# Patient Record
Sex: Female | Born: 1975 | Race: Black or African American | Hispanic: No | Smoking: Never smoker
Health system: Southern US, Community
[De-identification: ages and names within clinical notes are randomized; demographics above are authoritative.]

## PROBLEM LIST (undated history)

## (undated) DIAGNOSIS — T4145XA Adverse effect of unspecified anesthetic, initial encounter: Secondary | ICD-10-CM

## (undated) DIAGNOSIS — R87629 Unspecified abnormal cytological findings in specimens from vagina: Secondary | ICD-10-CM

## (undated) DIAGNOSIS — R87619 Unspecified abnormal cytological findings in specimens from cervix uteri: Secondary | ICD-10-CM

## (undated) DIAGNOSIS — IMO0002 Reserved for concepts with insufficient information to code with codable children: Secondary | ICD-10-CM

## (undated) DIAGNOSIS — E663 Overweight: Secondary | ICD-10-CM

## (undated) DIAGNOSIS — R809 Proteinuria, unspecified: Secondary | ICD-10-CM

## (undated) DIAGNOSIS — N920 Excessive and frequent menstruation with regular cycle: Secondary | ICD-10-CM

## (undated) DIAGNOSIS — B977 Papillomavirus as the cause of diseases classified elsewhere: Secondary | ICD-10-CM

## (undated) DIAGNOSIS — N200 Calculus of kidney: Secondary | ICD-10-CM

## (undated) DIAGNOSIS — Z87448 Personal history of other diseases of urinary system: Secondary | ICD-10-CM

## (undated) DIAGNOSIS — Z973 Presence of spectacles and contact lenses: Secondary | ICD-10-CM

## (undated) DIAGNOSIS — F419 Anxiety disorder, unspecified: Secondary | ICD-10-CM

## (undated) DIAGNOSIS — T8859XA Other complications of anesthesia, initial encounter: Secondary | ICD-10-CM

## (undated) DIAGNOSIS — B009 Herpesviral infection, unspecified: Secondary | ICD-10-CM

## (undated) DIAGNOSIS — Z87440 Personal history of urinary (tract) infections: Secondary | ICD-10-CM

## (undated) DIAGNOSIS — D259 Leiomyoma of uterus, unspecified: Secondary | ICD-10-CM

## (undated) DIAGNOSIS — I10 Essential (primary) hypertension: Secondary | ICD-10-CM

## (undated) DIAGNOSIS — D509 Iron deficiency anemia, unspecified: Secondary | ICD-10-CM

## (undated) HISTORY — DX: Unspecified abnormal cytological findings in specimens from cervix uteri: R87.619

## (undated) HISTORY — DX: Presence of spectacles and contact lenses: Z97.3

## (undated) HISTORY — DX: Reserved for concepts with insufficient information to code with codable children: IMO0002

## (undated) HISTORY — DX: Unspecified abnormal cytological findings in specimens from vagina: R87.629

## (undated) HISTORY — DX: Calculus of kidney: N20.0

## (undated) HISTORY — PX: DILATION AND CURETTAGE OF UTERUS: SHX78

## (undated) HISTORY — DX: Excessive and frequent menstruation with regular cycle: N92.0

## (undated) HISTORY — DX: Leiomyoma of uterus, unspecified: D25.9

## (undated) HISTORY — DX: Papillomavirus as the cause of diseases classified elsewhere: B97.7

## (undated) HISTORY — DX: Herpesviral infection, unspecified: B00.9

## (undated) HISTORY — DX: Personal history of other diseases of urinary system: Z87.448

---

## 1998-09-17 ENCOUNTER — Inpatient Hospital Stay (HOSPITAL_COMMUNITY): Admission: AD | Admit: 1998-09-17 | Discharge: 1998-09-21 | Payer: Self-pay | Admitting: Obstetrics and Gynecology

## 1998-11-04 ENCOUNTER — Inpatient Hospital Stay (HOSPITAL_COMMUNITY): Admission: RE | Admit: 1998-11-04 | Discharge: 1998-11-12 | Payer: Self-pay | Admitting: *Deleted

## 1998-11-06 ENCOUNTER — Encounter: Payer: Self-pay | Admitting: *Deleted

## 1998-11-12 ENCOUNTER — Encounter: Payer: Self-pay | Admitting: *Deleted

## 1998-11-24 ENCOUNTER — Inpatient Hospital Stay (HOSPITAL_COMMUNITY): Admission: AD | Admit: 1998-11-24 | Discharge: 1998-11-27 | Payer: Self-pay | Admitting: Obstetrics & Gynecology

## 1999-01-27 HISTORY — PX: OTHER SURGICAL HISTORY: SHX169

## 1999-02-15 ENCOUNTER — Encounter: Payer: Self-pay | Admitting: Urology

## 1999-02-15 ENCOUNTER — Inpatient Hospital Stay (HOSPITAL_COMMUNITY): Admission: RE | Admit: 1999-02-15 | Discharge: 1999-02-18 | Payer: Self-pay | Admitting: Urology

## 1999-02-16 ENCOUNTER — Encounter: Payer: Self-pay | Admitting: Urology

## 1999-02-18 ENCOUNTER — Encounter: Payer: Self-pay | Admitting: Urology

## 1999-03-05 ENCOUNTER — Inpatient Hospital Stay (HOSPITAL_COMMUNITY): Admission: EM | Admit: 1999-03-05 | Discharge: 1999-03-08 | Payer: Self-pay | Admitting: Emergency Medicine

## 1999-03-06 ENCOUNTER — Encounter: Payer: Self-pay | Admitting: Urology

## 1999-04-11 ENCOUNTER — Ambulatory Visit (HOSPITAL_COMMUNITY): Admission: RE | Admit: 1999-04-11 | Discharge: 1999-04-11 | Payer: Self-pay | Admitting: Urology

## 1999-05-19 ENCOUNTER — Ambulatory Visit (HOSPITAL_COMMUNITY): Admission: RE | Admit: 1999-05-19 | Discharge: 1999-05-19 | Payer: Self-pay | Admitting: Urology

## 1999-05-19 ENCOUNTER — Encounter: Payer: Self-pay | Admitting: Urology

## 1999-06-14 ENCOUNTER — Encounter: Admission: RE | Admit: 1999-06-14 | Discharge: 1999-06-14 | Payer: Self-pay | Admitting: Urology

## 1999-06-14 ENCOUNTER — Encounter: Payer: Self-pay | Admitting: Urology

## 1999-11-16 ENCOUNTER — Encounter: Payer: Self-pay | Admitting: Obstetrics and Gynecology

## 1999-11-16 ENCOUNTER — Ambulatory Visit (HOSPITAL_COMMUNITY): Admission: RE | Admit: 1999-11-16 | Discharge: 1999-11-16 | Payer: Self-pay | Admitting: Obstetrics and Gynecology

## 1999-11-30 ENCOUNTER — Ambulatory Visit (HOSPITAL_COMMUNITY): Admission: RE | Admit: 1999-11-30 | Discharge: 1999-11-30 | Payer: Self-pay | Admitting: Obstetrics and Gynecology

## 2000-05-01 ENCOUNTER — Inpatient Hospital Stay (HOSPITAL_COMMUNITY): Admission: AD | Admit: 2000-05-01 | Discharge: 2000-05-03 | Payer: Self-pay | Admitting: Obstetrics and Gynecology

## 2001-02-07 ENCOUNTER — Other Ambulatory Visit: Admission: RE | Admit: 2001-02-07 | Discharge: 2001-02-07 | Payer: Self-pay | Admitting: Obstetrics and Gynecology

## 2001-09-30 ENCOUNTER — Inpatient Hospital Stay (HOSPITAL_COMMUNITY): Admission: AD | Admit: 2001-09-30 | Discharge: 2001-10-02 | Payer: Self-pay | Admitting: Obstetrics and Gynecology

## 2001-10-01 ENCOUNTER — Encounter: Payer: Self-pay | Admitting: Obstetrics and Gynecology

## 2001-11-14 ENCOUNTER — Inpatient Hospital Stay (HOSPITAL_COMMUNITY): Admission: AD | Admit: 2001-11-14 | Discharge: 2001-11-14 | Payer: Self-pay | Admitting: Obstetrics and Gynecology

## 2001-11-26 ENCOUNTER — Inpatient Hospital Stay (HOSPITAL_COMMUNITY): Admission: AD | Admit: 2001-11-26 | Discharge: 2001-11-26 | Payer: Self-pay | Admitting: Obstetrics and Gynecology

## 2001-12-02 ENCOUNTER — Encounter: Payer: Self-pay | Admitting: Urology

## 2001-12-02 ENCOUNTER — Encounter: Admission: RE | Admit: 2001-12-02 | Discharge: 2001-12-02 | Payer: Self-pay | Admitting: Urology

## 2002-03-15 ENCOUNTER — Inpatient Hospital Stay (HOSPITAL_COMMUNITY): Admission: AD | Admit: 2002-03-15 | Discharge: 2002-03-17 | Payer: Self-pay | Admitting: Obstetrics and Gynecology

## 2002-04-14 ENCOUNTER — Encounter: Admission: RE | Admit: 2002-04-14 | Discharge: 2002-04-14 | Payer: Self-pay | Admitting: Urology

## 2002-04-14 ENCOUNTER — Encounter: Payer: Self-pay | Admitting: Urology

## 2002-07-23 ENCOUNTER — Encounter: Payer: Self-pay | Admitting: Emergency Medicine

## 2002-07-23 ENCOUNTER — Emergency Department (HOSPITAL_COMMUNITY): Admission: EM | Admit: 2002-07-23 | Discharge: 2002-07-23 | Payer: Self-pay | Admitting: Emergency Medicine

## 2003-08-29 HISTORY — PX: OTHER SURGICAL HISTORY: SHX169

## 2003-09-04 ENCOUNTER — Ambulatory Visit (HOSPITAL_BASED_OUTPATIENT_CLINIC_OR_DEPARTMENT_OTHER): Admission: RE | Admit: 2003-09-04 | Discharge: 2003-09-04 | Payer: Self-pay | Admitting: Orthopedic Surgery

## 2005-03-22 ENCOUNTER — Inpatient Hospital Stay (HOSPITAL_COMMUNITY): Admission: EM | Admit: 2005-03-22 | Discharge: 2005-03-25 | Payer: Self-pay | Admitting: Urology

## 2005-12-27 ENCOUNTER — Encounter: Payer: Self-pay | Admitting: Obstetrics and Gynecology

## 2006-10-30 ENCOUNTER — Ambulatory Visit: Payer: Self-pay | Admitting: Family Medicine

## 2006-10-30 DIAGNOSIS — Z87442 Personal history of urinary calculi: Secondary | ICD-10-CM | POA: Insufficient documentation

## 2006-10-30 DIAGNOSIS — J45909 Unspecified asthma, uncomplicated: Secondary | ICD-10-CM | POA: Insufficient documentation

## 2006-10-30 DIAGNOSIS — R809 Proteinuria, unspecified: Secondary | ICD-10-CM | POA: Insufficient documentation

## 2006-10-30 DIAGNOSIS — I10 Essential (primary) hypertension: Secondary | ICD-10-CM | POA: Insufficient documentation

## 2006-10-30 LAB — CONVERTED CEMR LAB
Bilirubin Urine: NEGATIVE
Nitrite: NEGATIVE
Protein, U semiquant: 30
Specific Gravity, Urine: 1.015

## 2006-11-05 ENCOUNTER — Encounter (INDEPENDENT_AMBULATORY_CARE_PROVIDER_SITE_OTHER): Payer: Self-pay | Admitting: Family Medicine

## 2006-11-06 ENCOUNTER — Telehealth (INDEPENDENT_AMBULATORY_CARE_PROVIDER_SITE_OTHER): Payer: Self-pay | Admitting: Family Medicine

## 2006-11-09 ENCOUNTER — Ambulatory Visit: Payer: Self-pay | Admitting: Family Medicine

## 2006-11-12 ENCOUNTER — Encounter (INDEPENDENT_AMBULATORY_CARE_PROVIDER_SITE_OTHER): Payer: Self-pay | Admitting: Family Medicine

## 2006-11-13 LAB — CONVERTED CEMR LAB
AST: 15 units/L (ref 0–37)
Albumin: 4 g/dL (ref 3.5–5.2)
Basophils Absolute: 0 10*3/uL (ref 0.0–0.1)
Cholesterol: 150 mg/dL (ref 0–200)
Creatinine, Ser: 0.86 mg/dL (ref 0.40–1.20)
Eosinophils Absolute: 0.2 10*3/uL (ref 0.0–0.7)
Eosinophils Relative: 2 % (ref 0–5)
Glucose, Bld: 97 mg/dL (ref 70–99)
HDL: 56 mg/dL (ref >39)
Hemoglobin: 12.3 g/dL (ref 12.0–15.0)
Lymphocytes Relative: 34 % (ref 12–46)
Lymphs Abs: 2.5 10*3/uL (ref 0.7–3.3)
MCV: 77.3 fL — ABNORMAL LOW (ref 78.0–100.0)
Monocytes Absolute: 0.5 10*3/uL (ref 0.2–0.7)
Platelets: 214 10*3/uL (ref 150–400)
Potassium: 4.4 meq/L (ref 3.5–5.3)
TSH: 1.289 microintl units/mL (ref 0.350–5.50)
Total Bilirubin: 0.3 mg/dL (ref 0.3–1.2)
Triglycerides: 90 mg/dL (ref <150)

## 2006-11-19 ENCOUNTER — Telehealth (INDEPENDENT_AMBULATORY_CARE_PROVIDER_SITE_OTHER): Payer: Self-pay | Admitting: Family Medicine

## 2006-12-03 ENCOUNTER — Ambulatory Visit: Payer: Self-pay | Admitting: Family Medicine

## 2006-12-03 LAB — CONVERTED CEMR LAB: LDL Goal: 160 mg/dL

## 2006-12-04 LAB — CONVERTED CEMR LAB
BUN: 16 mg/dL (ref 6–23)
CO2: 19 meq/L (ref 19–32)
Sodium: 139 meq/L (ref 135–145)

## 2007-01-14 ENCOUNTER — Ambulatory Visit: Payer: Self-pay | Admitting: Family Medicine

## 2007-01-14 DIAGNOSIS — B351 Tinea unguium: Secondary | ICD-10-CM | POA: Insufficient documentation

## 2007-01-14 DIAGNOSIS — F411 Generalized anxiety disorder: Secondary | ICD-10-CM | POA: Insufficient documentation

## 2007-01-15 ENCOUNTER — Encounter (INDEPENDENT_AMBULATORY_CARE_PROVIDER_SITE_OTHER): Payer: Self-pay | Admitting: Family Medicine

## 2007-01-15 LAB — CONVERTED CEMR LAB
Eosinophils Relative: 1 % (ref 0–5)
HCT: 33.8 % — ABNORMAL LOW (ref 36.0–46.0)
Hemoglobin: 11.8 g/dL — ABNORMAL LOW (ref 12.0–15.0)
Lymphs Abs: 2.3 10*3/uL (ref 0.7–3.3)
MCHC: 34.9 g/dL (ref 30.0–36.0)
Monocytes Relative: 6 % (ref 3–11)
Neutro Abs: 5.4 10*3/uL (ref 1.7–7.7)
Platelets: 213 10*3/uL (ref 150–400)
RDW: 14.4 % — ABNORMAL HIGH (ref 11.5–14.0)
WBC: 8.2 10*3/uL (ref 4.0–10.5)

## 2007-02-06 ENCOUNTER — Ambulatory Visit: Admission: RE | Admit: 2007-02-06 | Discharge: 2007-02-06 | Payer: Self-pay | Admitting: Family Medicine

## 2007-02-08 ENCOUNTER — Ambulatory Visit: Payer: Self-pay | Admitting: Pulmonary Disease

## 2007-02-15 ENCOUNTER — Ambulatory Visit: Payer: Self-pay | Admitting: Family Medicine

## 2007-02-15 DIAGNOSIS — D509 Iron deficiency anemia, unspecified: Secondary | ICD-10-CM | POA: Insufficient documentation

## 2007-02-16 ENCOUNTER — Encounter (INDEPENDENT_AMBULATORY_CARE_PROVIDER_SITE_OTHER): Payer: Self-pay | Admitting: Family Medicine

## 2007-02-18 LAB — CONVERTED CEMR LAB
Iron: 34 ug/dL — ABNORMAL LOW (ref 42–145)
Retic Count, Absolute: 49.6 (ref 19.0–186.0)

## 2007-02-19 ENCOUNTER — Encounter (INDEPENDENT_AMBULATORY_CARE_PROVIDER_SITE_OTHER): Payer: Self-pay | Admitting: Family Medicine

## 2007-03-19 ENCOUNTER — Telehealth (INDEPENDENT_AMBULATORY_CARE_PROVIDER_SITE_OTHER): Payer: Self-pay | Admitting: Family Medicine

## 2007-03-22 ENCOUNTER — Ambulatory Visit: Payer: Self-pay | Admitting: Family Medicine

## 2007-03-22 ENCOUNTER — Encounter (INDEPENDENT_AMBULATORY_CARE_PROVIDER_SITE_OTHER): Payer: Self-pay | Admitting: Family Medicine

## 2007-03-22 ENCOUNTER — Other Ambulatory Visit: Admission: RE | Admit: 2007-03-22 | Discharge: 2007-03-22 | Payer: Self-pay | Admitting: Family Medicine

## 2007-03-22 HISTORY — DX: Essential (primary) hypertension: I10

## 2007-03-22 HISTORY — DX: Calculus of kidney: N20.0

## 2007-03-22 HISTORY — DX: Proteinuria, unspecified: R80.9

## 2007-03-22 HISTORY — DX: Iron deficiency anemia, unspecified: D50.9

## 2007-03-22 HISTORY — DX: Overweight: E66.3

## 2007-03-22 HISTORY — DX: Anxiety disorder, unspecified: F41.9

## 2007-03-22 HISTORY — DX: Personal history of urinary (tract) infections: Z87.440

## 2007-03-23 ENCOUNTER — Encounter (INDEPENDENT_AMBULATORY_CARE_PROVIDER_SITE_OTHER): Payer: Self-pay | Admitting: Family Medicine

## 2007-03-24 LAB — CONVERTED CEMR LAB: Candida species: NEGATIVE

## 2007-03-25 ENCOUNTER — Encounter (INDEPENDENT_AMBULATORY_CARE_PROVIDER_SITE_OTHER): Payer: Self-pay | Admitting: Family Medicine

## 2007-03-25 ENCOUNTER — Telehealth (INDEPENDENT_AMBULATORY_CARE_PROVIDER_SITE_OTHER): Payer: Self-pay | Admitting: *Deleted

## 2007-03-26 ENCOUNTER — Encounter (INDEPENDENT_AMBULATORY_CARE_PROVIDER_SITE_OTHER): Payer: Self-pay | Admitting: Family Medicine

## 2007-03-26 ENCOUNTER — Telehealth (INDEPENDENT_AMBULATORY_CARE_PROVIDER_SITE_OTHER): Payer: Self-pay | Admitting: *Deleted

## 2007-03-26 ENCOUNTER — Encounter: Admission: RE | Admit: 2007-03-26 | Discharge: 2007-03-26 | Payer: Self-pay | Admitting: Family Medicine

## 2007-03-27 ENCOUNTER — Ambulatory Visit (HOSPITAL_COMMUNITY): Admission: RE | Admit: 2007-03-27 | Discharge: 2007-03-27 | Payer: Self-pay | Admitting: Family Medicine

## 2007-03-28 ENCOUNTER — Telehealth (INDEPENDENT_AMBULATORY_CARE_PROVIDER_SITE_OTHER): Payer: Self-pay | Admitting: *Deleted

## 2007-04-11 ENCOUNTER — Ambulatory Visit: Payer: Self-pay | Admitting: Family Medicine

## 2007-09-24 ENCOUNTER — Ambulatory Visit: Payer: Self-pay | Admitting: Family Medicine

## 2007-09-25 ENCOUNTER — Encounter (INDEPENDENT_AMBULATORY_CARE_PROVIDER_SITE_OTHER): Payer: Self-pay | Admitting: Family Medicine

## 2007-09-25 ENCOUNTER — Telehealth (INDEPENDENT_AMBULATORY_CARE_PROVIDER_SITE_OTHER): Payer: Self-pay | Admitting: *Deleted

## 2007-09-25 LAB — CONVERTED CEMR LAB
Basophils Relative: 0 % (ref 0–1)
Eosinophils Relative: 1 % (ref 0–5)
Lymphocytes Relative: 31 % (ref 12–46)
Monocytes Absolute: 0.5 10*3/uL (ref 0.1–1.0)
Monocytes Relative: 6 % (ref 3–12)
Neutro Abs: 4.9 10*3/uL (ref 1.7–7.7)
Neutrophils Relative %: 62 % (ref 43–77)
Platelets: 221 10*3/uL (ref 150–400)
WBC: 8 10*3/uL (ref 4.0–10.5)

## 2007-11-14 ENCOUNTER — Encounter: Admission: RE | Admit: 2007-11-14 | Discharge: 2007-11-14 | Payer: Self-pay | Admitting: General Surgery

## 2008-01-02 ENCOUNTER — Encounter (INDEPENDENT_AMBULATORY_CARE_PROVIDER_SITE_OTHER): Payer: Self-pay | Admitting: Family Medicine

## 2008-01-24 ENCOUNTER — Ambulatory Visit: Payer: Self-pay | Admitting: Family Medicine

## 2008-01-24 DIAGNOSIS — E663 Overweight: Secondary | ICD-10-CM | POA: Insufficient documentation

## 2008-01-24 DIAGNOSIS — N390 Urinary tract infection, site not specified: Secondary | ICD-10-CM | POA: Insufficient documentation

## 2008-01-25 ENCOUNTER — Encounter (INDEPENDENT_AMBULATORY_CARE_PROVIDER_SITE_OTHER): Payer: Self-pay | Admitting: Family Medicine

## 2008-01-31 ENCOUNTER — Encounter (INDEPENDENT_AMBULATORY_CARE_PROVIDER_SITE_OTHER): Payer: Self-pay | Admitting: Family Medicine

## 2008-01-31 LAB — CONVERTED CEMR LAB
Basophils Absolute: 0 10*3/uL (ref 0.0–0.1)
Calcium: 8.9 mg/dL (ref 8.4–10.5)
Eosinophils Absolute: 0 10*3/uL (ref 0.0–0.7)
Eosinophils Relative: 1 % (ref 0–5)
Glucose, Bld: 104 mg/dL — ABNORMAL HIGH (ref 70–99)
HCT: 34.6 % — ABNORMAL LOW (ref 36.0–46.0)
Hemoglobin: 11.6 g/dL — ABNORMAL LOW (ref 12.0–15.0)
Sodium: 140 meq/L (ref 135–145)
WBC: 7.8 10*3/uL (ref 4.0–10.5)

## 2008-02-11 ENCOUNTER — Encounter (INDEPENDENT_AMBULATORY_CARE_PROVIDER_SITE_OTHER): Payer: Self-pay | Admitting: Family Medicine

## 2008-07-31 ENCOUNTER — Ambulatory Visit: Payer: Self-pay | Admitting: Family Medicine

## 2008-07-31 LAB — CONVERTED CEMR LAB

## 2008-08-04 ENCOUNTER — Telehealth (INDEPENDENT_AMBULATORY_CARE_PROVIDER_SITE_OTHER): Payer: Self-pay | Admitting: *Deleted

## 2008-08-05 ENCOUNTER — Ambulatory Visit: Payer: Self-pay | Admitting: Family Medicine

## 2008-09-07 ENCOUNTER — Encounter (INDEPENDENT_AMBULATORY_CARE_PROVIDER_SITE_OTHER): Payer: Self-pay | Admitting: Family Medicine

## 2009-03-09 ENCOUNTER — Encounter (INDEPENDENT_AMBULATORY_CARE_PROVIDER_SITE_OTHER): Payer: Self-pay | Admitting: Family Medicine

## 2009-04-14 ENCOUNTER — Other Ambulatory Visit: Admission: RE | Admit: 2009-04-14 | Discharge: 2009-04-14 | Payer: Self-pay | Admitting: Obstetrics and Gynecology

## 2010-04-12 ENCOUNTER — Emergency Department (HOSPITAL_COMMUNITY): Admission: EM | Admit: 2010-04-12 | Discharge: 2010-04-12 | Payer: Self-pay | Admitting: Family Medicine

## 2010-04-28 ENCOUNTER — Ambulatory Visit: Payer: Self-pay | Admitting: Family Medicine

## 2010-04-28 DIAGNOSIS — E876 Hypokalemia: Secondary | ICD-10-CM | POA: Insufficient documentation

## 2010-04-28 DIAGNOSIS — M2550 Pain in unspecified joint: Secondary | ICD-10-CM | POA: Insufficient documentation

## 2010-04-28 DIAGNOSIS — R609 Edema, unspecified: Secondary | ICD-10-CM | POA: Insufficient documentation

## 2010-04-29 ENCOUNTER — Encounter: Payer: Self-pay | Admitting: Physician Assistant

## 2010-04-29 LAB — CONVERTED CEMR LAB: Iron: 44 ug/dL (ref 42–145)

## 2010-05-03 ENCOUNTER — Encounter: Payer: Self-pay | Admitting: Physician Assistant

## 2010-05-06 LAB — CONVERTED CEMR LAB
Anti Nuclear Antibody(ANA): NEGATIVE
Calcium: 9 mg/dL (ref 8.4–10.5)
Eosinophils Absolute: 0.2 10*3/uL (ref 0.0–0.7)
Eosinophils Relative: 2 % (ref 0–5)
HCT: 34.3 % — ABNORMAL LOW (ref 36.0–46.0)
Hemoglobin: 11.9 g/dL — ABNORMAL LOW (ref 12.0–15.0)
Monocytes Relative: 7 % (ref 3–12)
Platelets: 258 10*3/uL (ref 150–400)
RBC: 4.22 M/uL (ref 3.87–5.11)
RDW: 13.1 % (ref 11.5–15.5)
Sed Rate: 18 mm/hr (ref 0–22)

## 2010-05-13 ENCOUNTER — Encounter: Payer: Self-pay | Admitting: Physician Assistant

## 2010-05-13 LAB — CONVERTED CEMR LAB: Potassium: 3.6 meq/L (ref 3.5–5.3)

## 2010-06-22 ENCOUNTER — Other Ambulatory Visit
Admission: RE | Admit: 2010-06-22 | Discharge: 2010-06-22 | Payer: Self-pay | Source: Home / Self Care | Admitting: Obstetrics and Gynecology

## 2010-06-22 ENCOUNTER — Encounter: Payer: Self-pay | Admitting: Family Medicine

## 2010-06-22 ENCOUNTER — Other Ambulatory Visit: Admission: RE | Admit: 2010-06-22 | Discharge: 2010-06-22 | Payer: Self-pay | Admitting: Obstetrics and Gynecology

## 2010-09-29 NOTE — Letter (Signed)
Summary: Laboratory/X-Ray Results  [facility]  [address]   [place], [place] [address]   Phone: [phone]  Fax: [phone]    Lab/X-Ray Results  May 13, 2010  MRN: [id number]  [facility] [address] [place], [place]  [address]    The results of your recent lab/x-ray has been reviewed and were found:      Your Potassium level is in the low normal range.  Continue taking your Potassium twice a day.    If you do not have an appointment scheduled please call the office for a follow up appointment.  We also need a correct phone number to reach you.    Esperanza Sheets PA

## 2010-09-29 NOTE — Letter (Signed)
Summary: Letter  Letter   Imported By: Lind Guest 05/12/2010 10:24:58  _____________________________________________________________________  External Attachment:    Type:   Image     Comment:   External Document

## 2010-09-29 NOTE — Assessment & Plan Note (Signed)
Summary: new patient- room 1   Vital Signs:  Patient profile:   35 year old female Height:      65.75 inches Weight:      165.75 pounds BMI:     27.05 O2 Sat:      97 % on Room air Pulse rate:   66 / minute Resp:     16 per minute BP sitting:   110 / 70  (left arm)  Vitals Entered By: Adella Hare LPN (April 28, 2010 3:40 PM) CC: new patient Is Patient Diabetic? No Pain Assessment Patient in pain? no        Primary Keeshawn Fakhouri:  Angel Heidelberg, MD  CC:  new patient.  History of Present Illness: New pt here to establish care with new PCP.  Pt presents today with concerns about ankle swelling.  Started about 3-4 weeks ago after she noticed a spot on the back of her Lt ankle that she thought was some sort of insect bite. A week later she had swelling in her Lt ankle.  She was seen at urgent care and prescription a cream for the bite, and steroids.  Then went to ER last week because now Rt ankle swelling.  Was told was due to her HTN and fluid retention.  Also that K+ was low to increase her K+ to 2 daily.  Pt denies other joint pain or swelling.  No muscles aching, or skin rashes.  No fever or chills. The ankle swelling is worse at the end of the day and better  in the mornings.   Current Medications (verified): 1)  Ferrous Sulfate 325 (65 Fe) Mg  Tbec (Ferrous Sulfate) .... Two Times A Day 2)  Norvasc 10 Mg Tabs (Amlodipine Besylate) .... One Tab By Mouth Once Daily 3)  Hydrochlorothiazide 25 Mg Tabs (Hydrochlorothiazide) .... One Tab By Mouth Once Daily 4)  Jolivette 0.35 Mg Tabs (Norethindrone) .... One Tab By Mouth Once Daily 5)  Biotin 1000 Mcg Tabs (Biotin) .... One Tab By Mouth Once Daily  Allergies (verified): 1)  ! * Hctz  Past History:  Past medical, surgical, family and social histories (including risk factors) reviewed for relevance to current acute and chronic problems.  Past Medical History: Current Problems:  OVERWEIGHT (ICD-278.02) UTI'S,  RECURRENT (ICD-599.0) ANEMIA, IRON DEFICIENCY (ICD-280.9) ONYCHOMYCOSIS, TOENAILS (ICD-110.1) ANXIETY STATE NOS (ICD-300.00) PROTEINURIA (ICD-791.0) HYPERTENSION, BENIGN ESSENTIAL (ICD-401.1) NEPHROLITHIASIS, HX OF (ICD-V13.01) ASTHMA (ICD-493.90) - as child  Past Surgical History: Reviewed history from 10/30/2006 and no changes required. rotary cuff - L Shoulder/ an. 2005 kidney stones/ june 2000  Family History: Reviewed history from 07/31/2008 and no changes required. father- healthy - 81 mother- DM, HTN, fibromyalgia,arthritis - 11 4 brothers- 1 with DM 46 - others helathy 4 children- healthy - 6 son, 53 and 62 year old daughter and 24 year old son - healthy  Social History: Reviewed history from 07/31/2008 and no changes required. separated four children, 16,14,10, 8 Never Smoked Alcohol use-occasionally Drug use-no Occupation: - Dealer.  Review of Systems General:  Denies chills and fever. CV:  Denies chest pain or discomfort and palpitations. Resp:  Denies cough and shortness of breath. MS:  Complains of joint pain and joint swelling; denies joint redness, muscle aches, cramps, muscle weakness, and stiffness. Derm:  Complains of lesion(s); denies insect bite(s), itching, and rash. Neuro:  Denies numbness and tingling.  Physical Exam  General:  Well-developed,well-nourished,in no acute distress; alert,appropriate and cooperative throughout examination Head:  Normocephalic  and atraumatic without obvious abnormalities. No apparent alopecia or balding. Ears:  External ear exam shows no significant lesions or deformities.  Otoscopic examination reveals clear canals, tympanic membranes are intact bilaterally without bulging, retraction, inflammation or discharge. Hearing is grossly normal bilaterally. Nose:  External nasal examination shows no deformity or inflammation. Nasal mucosa are pink and moist without lesions or exudates. Mouth:  Oral  mucosa and oropharynx without lesions or exudates.  Teeth in good repair. Neck:  No deformities, masses, or tenderness noted. Lungs:  Normal respiratory effort, chest expands symmetrically. Lungs are clear to auscultation, no crackles or wheezes. Heart:  Normal rate and regular rhythm. S1 and S2 normal without gallop, murmur, click, rub or other extra sounds. Msk:  Bilat ankles:  FROM, nontender to palp, no instability.  Mild swelling noted Lt peri lateral malleolus area. Pulses:  R posterior tibial normal, R dorsalis pedis normal, L posterior tibial normal, and L dorsalis pedis normal.   Extremities:  No clubbing, cyanosis, or deformity noted with normal full range of motion of all joints.   Neurologic:  alert & oriented X3, strength normal in all extremities, gait normal, and DTRs symmetrical and normal.   Skin:  Intact without suspicious lesions or rashes Cervical Nodes:  No lymphadenopathy noted Psych:  Cognition and judgment appear intact. Alert and cooperative with normal attention span and concentration. No apparent delusions, illusions, hallucinations   Impression & Recommendations:  Problem # 1:  PERIPHERAL EDEMA (ICD-782.3) Assessment New  Her updated medication list for this problem includes:    Hydrochlorothiazide 25 Mg Tabs (Hydrochlorothiazide) ..... One tab by mouth once daily  Problem # 2:  HYPERTENSION, BENIGN ESSENTIAL (ICD-401.1)  The following medications were removed from the medication list:    Amlodipine Besylate 5 Mg Tabs (Amlodipine besylate) ..... One daily    Bystolic 10 Mg Tabs (Nebivolol hcl) ..... One daily Her updated medication list for this problem includes:    Norvasc 10 Mg Tabs (Amlodipine besylate) ..... One tab by mouth once daily    Hydrochlorothiazide 25 Mg Tabs (Hydrochlorothiazide) ..... One tab by mouth once daily  Orders: T-Basic Metabolic Panel [id number])  BP today: 110/70 Prior BP: 134/88 (08/05/2008)  Prior 10 Yr Risk Heart Disease:  1 % (12/03/2006)  Labs Reviewed: K+: 3.8 (01/25/2008) Creat: : 0.90 (01/25/2008)   Chol: 150 (11/12/2006)   HDL: 56 (11/12/2006)   LDL: 76 (11/12/2006)   TG: 90 (11/12/2006)  Problem # 3:  HYPOKALEMIA (ICD-276.8) Assessment: New Pt increased her K+ to 2 daily just a few days ago, as advised by ER.  Complete Medication List: 1)  Ferrous Sulfate 325 (65 Fe) Mg Tbec (Ferrous sulfate) .... Two times a day 2)  Norvasc 10 Mg Tabs (Amlodipine besylate) .... One tab by mouth once daily 3)  Hydrochlorothiazide 25 Mg Tabs (Hydrochlorothiazide) .... One tab by mouth once daily 4)  Jolivette 0.35 Mg Tabs (Norethindrone) .... One tab by mouth once daily 5)  Biotin 1000 Mcg Tabs (Biotin) .... One tab by mouth once daily 6)  Klor-con M20 20 Meq Cr-tabs (Potassium chloride crys cr) .... Take 2 daily  Other Orders: T-CBC w/Diff ([id number]) T-Antinuclear Antib (ANA) [id number]) T-Sed Rate (Automated) [id number]) T-Lyme Disease [id number])  Patient Instructions: 1)  Please schedule a follow-up appointment in 2 weeks. 2)  Continue your meds as currently taking 3)  Limit your Sodium (Salt) to less than 2 grams a day(slightly less than 1/2 a teaspoon) to prevent fluid retention, swelling, or worsening of symptoms. 4)  Elevate legs, etc as discussed. 5)  Have blood work drawn today.

## 2010-09-29 NOTE — Letter (Signed)
Summary: [facility]  [facility]   Imported By: Lind Guest 06/27/2010 13:31:45  _____________________________________________________________________  External Attachment:    Type:   Image     Comment:   External Document

## 2010-11-23 ENCOUNTER — Telehealth: Payer: Self-pay | Admitting: Family Medicine

## 2010-11-23 NOTE — Telephone Encounter (Signed)
No suggestions on any med other than OTC tylenol for headache, if severe advise urgent care or Ed before her appt

## 2010-11-23 NOTE — Telephone Encounter (Signed)
Patient called in and states she has been having horrible headaches for the past 2 weeks.  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  She has been taking migraine headache medication over the counter.  I gave her an appointment on 12/15/10, do you know of a better medication she can take.  Please advise.

## 2010-12-13 ENCOUNTER — Encounter: Payer: Self-pay | Admitting: Family Medicine

## 2010-12-14 ENCOUNTER — Encounter: Payer: Self-pay | Admitting: Family Medicine

## 2010-12-14 ENCOUNTER — Encounter: Payer: Self-pay | Admitting: Physician Assistant

## 2010-12-15 ENCOUNTER — Encounter: Payer: Self-pay | Admitting: Family Medicine

## 2010-12-15 ENCOUNTER — Ambulatory Visit (INDEPENDENT_AMBULATORY_CARE_PROVIDER_SITE_OTHER): Payer: 59 | Admitting: Family Medicine

## 2010-12-15 VITALS — BP 140/84 | HR 58 | Resp 16 | Ht 66.25 in | Wt 164.1 lb

## 2010-12-15 DIAGNOSIS — Z1159 Encounter for screening for other viral diseases: Secondary | ICD-10-CM

## 2010-12-15 DIAGNOSIS — R5381 Other malaise: Secondary | ICD-10-CM

## 2010-12-15 DIAGNOSIS — Z23 Encounter for immunization: Secondary | ICD-10-CM

## 2010-12-15 DIAGNOSIS — R079 Chest pain, unspecified: Secondary | ICD-10-CM

## 2010-12-15 DIAGNOSIS — Z13220 Encounter for screening for lipoid disorders: Secondary | ICD-10-CM

## 2010-12-15 DIAGNOSIS — R51 Headache: Secondary | ICD-10-CM

## 2010-12-15 DIAGNOSIS — R7301 Impaired fasting glucose: Secondary | ICD-10-CM

## 2010-12-15 DIAGNOSIS — D649 Anemia, unspecified: Secondary | ICD-10-CM

## 2010-12-15 DIAGNOSIS — Z114 Encounter for screening for human immunodeficiency virus [HIV]: Secondary | ICD-10-CM

## 2010-12-15 MED ORDER — SUMATRIPTAN SUCCINATE 100 MG PO TABS: 100.0000 mg | ORAL_TABLET | Freq: Once | ORAL | Status: DC | PRN

## 2010-12-15 MED ORDER — IBUPROFEN 800 MG PO TABS: 800.0000 mg | ORAL_TABLET | Freq: Three times a day (TID) | ORAL | Status: AC | PRN

## 2010-12-15 NOTE — Progress Notes (Signed)
  Subjective:    Patient ID: Angel Morris, female    DOB: [birth year], 35 y.o.   MRN: [id number]  HPI 4 weeks ago, pt experienced a 2 week h/o severe daily unilateral right sided pounding headaches, no nausea or neurologic deficits associated with this. Started in the early afternoon, rated a 10 plus, only real relief was from sleep. New onset , no fam h/o headaches.  Recently started running, concerned about diabetes in the family, also a question about the s/e of hyperglycemia. Experiences intermittent left chest tightness and pressure for 1 year. Experiences pain on avg once every 3 months , duration is max of 10 mins, non radiating, no associated symptoms Review of Systems Denies recent fever or chills. Denies sinus pressure, nasal congestion, ear pain or sore throat. Denies chest congestion, productive cough or wheezing. Denies palpitations, paroxysmal nocturnal dyspnea, orthopnea and leg swelling Denies abdominal pain, nausea, vomiting,diarrhea or constipation.  Denies rectal bleeding or change in bowel movement. Denies dysuria, frequency, hesitancy or incontinence. Denies joint pain, swelling and limitation and mobility. Denies seizure, numbness, or tingling. Denies depression, anxiety or insomnia. Denies skin break down or rash.        Objective:   Physical Exam Patient alert and oriented and in no Cardiopulmonary distress.  HEENT: No facial asymmetry, EOMI, no sinus tenderness, TM's clear, Oropharynx pink and moist.  Neck supple no adenopathy.  Chest: Clear to auscultation bilaterally.  CVS: S1, S2 no murmurs, no S3.  ABD: Soft non tender. Bowel sounds normal.  Ext: No edema  MS: Adequate ROM spine, shoulders, hips and knees.  Skin: Intact, no ulcerations or rash noted.  Psych: Good eye contact, normal affect. Memory intact not anxious or depressed appearing.  CNS: CN 2-12 intact, power, tone and sensation normal throughout.        Assessment & Plan:    1.Hypertension:Suboptimal control, not at goal. No medication changes at this time. Lifestyle modification only.  2.Chest pain: normal EKG , history also not consistent with heart disease, pt reassured. 3. New headache: neurologic exam is normal, likely migraine. 4. Overweight: increased exercise with weight loss goal encouraged, there is a family history of diabetes  5. Immunization updated

## 2010-12-15 NOTE — Patient Instructions (Addendum)
F/u in 3 months.  Your EKG today  Shows no signs of heart blockage or damage in your heart You are being referred for an MRI of your brain to eval new headache which I believe is migraine.  Two meds are being prescribed, ibuprofen and immitrex in case you have another bad headache, pls also start a diary.  It is important that you exercise regularly at least 45 minutes 5 times a week. If you develop chest pain, have severe difficulty breathing, or feel very tired, stop exercising immediately and seek medical attention. It is great that you have started doing this. A healthy diet is rich in fruit, vegetables and whole grains. Poultry fish, nuts and beans are a healthy choice for protein rather then red meat. A low sodium diet and drinking 64 ounces of water daily is generally recommended. Oils and sweet should be limited. Carbohydrates especially for those who are diabetic or overweight, should be limited to 34-45 gram per meal. It is important to eat on a regular schedule, at least 3 times daily. Snacks should be primarily fruits, vegetables or nuts.   Pls start one multivitamin once daily.  CBC and anemia panel, lipid, TSH and HBA1C as soon as possible. TdAP today

## 2010-12-16 ENCOUNTER — Other Ambulatory Visit: Payer: Self-pay | Admitting: Family Medicine

## 2010-12-16 DIAGNOSIS — R51 Headache: Secondary | ICD-10-CM

## 2010-12-18 ENCOUNTER — Encounter: Payer: Self-pay | Admitting: Family Medicine

## 2010-12-19 NOTE — Progress Notes (Signed)
Addended by: Everitt Amber on: 12/19/2010 03:54 PM   Modules accepted: Orders

## 2010-12-20 LAB — HEMOGLOBIN A1C: Hgb A1c MFr Bld: 5.9 % — ABNORMAL HIGH (ref <5.7)

## 2010-12-20 LAB — IRON AND TIBC: UIBC: 340 ug/dL

## 2010-12-20 LAB — CBC WITH DIFFERENTIAL/PLATELET
Eosinophils Absolute: 0.1 10*3/uL (ref 0.0–0.7)
Eosinophils Relative: 2 % (ref 0–5)
Hemoglobin: 12.4 g/dL (ref 12.0–15.0)
Lymphocytes Relative: 23 % (ref 12–46)
MCHC: 34.8 g/dL (ref 30.0–36.0)
MCV: 78.4 fL (ref 78.0–100.0)
Monocytes Relative: 5 % (ref 3–12)
RBC: 4.54 MIL/uL (ref 3.87–5.11)
RDW: 15.3 % (ref 11.5–15.5)

## 2010-12-20 LAB — LIPID PANEL
Cholesterol: 148 mg/dL (ref 0–200)
HDL: 53 mg/dL (ref >39)
LDL Cholesterol: 84 mg/dL (ref 0–99)
Triglycerides: 55 mg/dL (ref <150)
VLDL: 11 mg/dL (ref 0–40)

## 2010-12-20 LAB — FERRITIN: Ferritin: 8 ng/mL — ABNORMAL LOW (ref 10–291)

## 2010-12-20 NOTE — Progress Notes (Signed)
Called patient and left message for her to call me back.

## 2010-12-22 ENCOUNTER — Ambulatory Visit (HOSPITAL_COMMUNITY)
Admission: RE | Admit: 2010-12-22 | Discharge: 2010-12-22 | Disposition: A | Discharge status: Home / Self Care | Payer: 59 | Source: Ambulatory Visit | Attending: Family Medicine | Admitting: Family Medicine

## 2010-12-22 DIAGNOSIS — R51 Headache: Secondary | ICD-10-CM | POA: Insufficient documentation

## 2011-01-10 NOTE — Procedures (Signed)
Angel Morris, Angel Morris             ACCOUNT NO.:  [id number]   MEDICAL RECORD NO.:  [id number]          PATIENT TYPE:  OUT   LOCATION:  SLEEP LAB                     FACILITY:  [facility]   PHYSICIAN:  Barbaraann Share, MD,FCCPDATE OF BIRTH:  12-17-75   DATE OF STUDY:  02/06/2007                            NOCTURNAL POLYSOMNOGRAM   REFERRING PHYSICIAN:  Franchot Heidelberg, M.D.   LOCATION:  Sleep lab.   INDICATION FOR STUDY:  Hypersomnia with sleep apnea.   EPWORTH SCORE:  17.   SLEEP ARCHITECTURE:  The patient had total sleep time of 375 minutes  with very little REM and never achieved slow wave sleep.  Sleep onset  latency was normal, and REM latency was prolonged at 236 minutes.  Sleep  efficiency was mentally decreased at 93%.   RESPIRATORY DATA:  The patient was found to have 13 hypopneas and 3  apneas for an apnea/hypopnea index of only 2.6 events per hour.  The  events were not positional, but there was loud snoring noted throughout.  The patient did not meet split night protocol secondary to the small  numbers of events.  The patient was found to have large numbers of  nonspecific arousals.   OXYGEN DATA:  There is O2 desaturation as low as 86% with her  obstructive events.   CARDIAC DATA:  No clinically significant cardiac arrhythmias were noted.   MOVEMENTS/PARASOMNIA:  The patient was found to have 73 leg jerks with  1.3 per hour resulting in arousal or awakening.   IMPRESSION/RECOMMENDATIONS:  1. Small numbers of obstructive events which do not meet the      apnea/hypopnea index criteria for the obstructive sleep apnea      syndrome.  The patient did have loud snoring and large numbers of      nonspecific arousals which may be indicative of the upper airway      resistance syndrome.  This is a presleep apnea condition that can      disrupt sleep.  It is typically treated with weight loss if      applicable and possibly oral appliance or upper airway surgery.  2.  Moderate numbers of leg jerks with only mild sleep disruption.      Clinical correlation is suggested to see if the patient may have a      history for a primary movement disorder of sleep such as the      restless leg syndrome.      Barbaraann Share, MD,FCCP  Diplomate, American Board of Sleep  Medicine  Electronically Signed     [facility]/MEDQ  D:  02/08/2007 15:07:52  T:  02/09/2007 09:32:41  Job:  [id number]   cc:   Franchot Heidelberg, M.D.

## 2011-01-13 NOTE — Op Note (Signed)
[facility]  Patient:    Angel Morris, PULLER Visit Number: [id number] MRN: [id number]          Service Type: OBS Location: 910A 9104 01 Attending Physician:  Shaune Spittle Dictated by:   Pierre Bali Normand Sloop, M.D. Proc. Date: 10/01/01 Admit Date:  09/30/2001                             Operative Report  PREOPERATIVE DIAGNOSES:       Intrauterine pregnancy at 15 weeks with incompetent cervix on ultrasound.  Cervix was 3.4 cm long with no funneling. Normal limited anatomy.  POSTOPERATIVE DIAGNOSES:      Intrauterine pregnancy at 15 weeks with incompetent cervix on ultrasound.  Cervix was 3.4 cm long with no funneling. Normal limited anatomy.  PROCEDURE:                    McDonald cerclage.  SURGEON:                      Naima A. Normand Sloop, M.D.  ANESTHESIA:                   Spinal.  ESTIMATED BLOOD LOSS:         Minimal.  INTRAVENOUS FLUIDS:           500 cc Crystalloid.  URINE OUTPUT:                 450 cc clear urine.  COMPLICATIONS:                None.  FINDINGS:                     A 15 week sized uterus.  The external os was dilated 1 cm.  Internal os was fingertip dilated.  There is no dilation noted after cerclage was placed.  No membranes were noted at the os.  Patient went to the recovery room in stable condition.  Clear urine was noted after the productive.  PROCEDURE IN DETAIL:          Patient was taken to the operating room.  She was given spinal anesthesia, placed in the dorsal lithotomy position, and prepped and draped in the normal sterile fashion.  The anterior and posterior lip were both grasped with ring forceps.  Mersilene stitch was then placed using a McDonald cerclage without difficulty.  There was minimal bleeding noted which was made hemostatic with the tying of the cerclage and the cervix was noted to be closed after patient went to recovery room in stable condition.  Sponge, lap, and needle counts  were correct x2.  All instruments were removed from vagina.  Before the McDonald cerclage was placed and the ring forceps were placed a weighted speculum was placed in the posterior fourchette of the vagina and the skinny Deavers were used for retraction.  All instruments were removed after the cerclage was completed.  Patients vaginal area was noted to be hemostatic.  Sponge, lap, and needle counts were correct x2.  Patient went to recovery room in stable condition. Dictated by:   Pierre Bali. Normand Sloop, M.D. Attending Physician:  Shaune Spittle DD:  10/01/01 TD:  10/02/01 Job: [id number]

## 2011-01-13 NOTE — H&P (Signed)
Angel Morris, Angel Morris             ACCOUNT NO.:  [id number]   MEDICAL RECORD NO.:  [id number]          PATIENT TYPE:  INP   LOCATION:  1411                         FACILITY:  [facility]   PHYSICIAN:  Jamison Neighbor, M.D.  DATE OF BIRTH:  1976-05-04   DATE OF ADMISSION:  03/22/2005  DATE OF DISCHARGE:                                HISTORY & PHYSICAL   ADMISSION DIAGNOSES:  Pyelonephritis with a past history of staghorn  calculus.   HISTORY:  This 35 year old female had a staghorn calculus that was treated  in the past with percutaneous nephrolithotripsy.  The patient was placed on  antibiotic prophylaxis.  When she takes her antibiotics, she tends to do  well.  She stopped those for several months for reasons that are unknown and  has developed sepsis.  The patient had a high temperature and took  antibiotics for about 24 hours.  When she did not defervesce, we felt like  she needed to be admitted to the hospital for antibiotic therapy.  The  patient has had temperatures as high as 103 degrees.   PAST MEDICAL HISTORY:  Unremarkable.  As a child, she had asthma.  She has  no breathing problems at this time.  She has had gestational diabetes as  well as anemia with her pregnancies.  Aside from her chronic cystitis and  the kidney stones, she does not have any other urologic problems.   Patient does not take medications on a regular basis other than occasional  Tylenol.  In the past, she did take Macrodantin prophylaxis.   The patient does not use alcohol.  Does not use tobacco.   FAMILY HISTORY:  Noncontributory.   PAST SURGICAL HISTORY:  1.  Lithotripsy, percutaneous nephrolithotripsy.  2.  Shoulder surgery.  3.  Elective therapeutic abortion.   PHYSICAL EXAMINATION:  VITAL SIGNS:  On examination today, the patient is  febrile with temperatures consistently above 102 degrees.  HEENT:  Normocephalic and atraumatic.  Cranial nerves II-XII are grossly  intact.  Patient does appear  to be slightly dehydrated and clearly is  somewhat toxic in her appearance.  NECK:  Supple with no adenopathy or thyromegaly.  LUNGS:  Clear.  HEART:  Regular rate and rhythm with no murmurs, thrills, gallops, rubs, or  heaves.  ABDOMEN:  Soft.  There is some tenderness primarily in the right flank.  EXTREMITIES:  No clubbing, cyanosis or edema.  NEUROLOGIC:  Intact.  VASCULAR:  Intact.   IMPRESSION:  Pyelonephritis with past history of staghorn calculus.   PLAN:  1.  Admit for IV antibiotic therapy.  2.  Renal ultrasound and KUB to assess her upper tracts and determine if she      is obstructed.  3.  Patient will need to remain in the hospital until she is free of      infection and will be sent home on long-term prophylaxis.     _______________    RJE/MEDQ  D:  03/23/2005  T:  03/23/2005  Job:  [id number]

## 2011-01-13 NOTE — Op Note (Signed)
[facility]  Patient:    Angel Morris, Angel Morris                    MRN: [id number] Proc. Date: 11/30/99 Adm. 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  OPERATION:  Cervical cerclage.  SURGEON:  Vanessa P. Haygood, M.D.  ASSISTANT:  ANESTHESIA:  Spinal.  ESTIMATED BLOOD LOSS:  Less than 20 cc.  COMPLICATIONS:  None.  FINDINGS:  The cervix had been measured at 3.8 cm on ultrasound.  The cervix from the external os to the vaginal fornix was approximately 2.5 cm.  The cervix was closed at the internal os.  DESCRIPTION OF PROCEDURE:  The patient was taken to the operating room after appropriate identification and placed on the operating table.  After placement of a spinal anesthetic, she was placed in the supine position and then in the lithotomy position.  The perineum and the vagina were prepped with multiple layers of Betadine and draped as a sterile field.  A weighted speculum was placed in the posterior vagina and a Deaver retractor used to  elevate the bladder.  The cervix was grasped on the anterior and posterior lip with additional clamp and the vaginal mucosa at the anterior vaginal fornix incised.  The bladder was dissected off the anterior cervix to the level of the internal os.  At that time a suture of 5 mm Mersilene was used to do a pursestring stitch around the cervix at the level of the internal os and tied to allow cinching down of the cervix.  The anterior vaginal mucosa was then repaired with a running suture of 3-0 chromic.  Hemostasis was noted to be adequate.  All instruments were removed from the vagina and the patient taken from the operating room to the recovery room in satisfactory condition having tolerated the procedure well with sponge and instrument counts  correct. DD:  11/30/99 TD:  12/01/99 Job: [id number]

## 2011-01-13 NOTE — Op Note (Signed)
Angel Morris, Angel Morris                       ACCOUNT NO.:  [id number]   MEDICAL RECORD NO.:  [id number]                   PATIENT TYPE:  AMB   LOCATION:  DSC                                  FACILITY:  [facility]   PHYSICIAN:  Harvie Junior, M.D.                DATE OF BIRTH:  [birth year]   DATE OF PROCEDURE:  09/04/2003  DATE OF DISCHARGE:                                 OPERATIVE REPORT   PREOPERATIVE DIAGNOSIS:  Shoulder instability, left.   POSTOPERATIVE DIAGNOSIS:  Shoulder instability, left.   PROCEDURE:  Left shoulder Bankart repair.   SURGEON:  Harvie Junior, M.D.   ASSISTANT:  Marshia Ly, P.A.   ANESTHESIA:  General.   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  She ultimately had had multiple recurrent  episodes of her shoulder popping out, most recently, has just become very  fearful of externally rotating her arm.  She was examined in the office and  found to have a dramatically positive apprehension relocation test and  because of this, it was ultimately discussed about treatment options and  felt that surgical intervention would be the most appropriate course of  action.  She is brought to the operating room for this procedure.   DESCRIPTION OF PROCEDURE:  The patient was taken to the operating room and  after undergoing general anesthesia, the patient was placed supine on the  operating table.  She was then moved into the beach chair position, all bony  prominences were well padded.  Attention was turned to the left shoulder  which was prepped and draped in the usual sterile fashion.  Following this,  an incision was made on the axilla extending proximally towards the coracoid  process.  The subcutaneous tissues were dissected down to the level of the  deltopectoral interval.  The deltopectoral interval was identified, the  cephalic vein was identified and protected, and was retracted laterally with  the deltoid.  The clavipectoral fascia was divided and retracted medially.  At this point, attention was turned to the subscap.  The subscap and capsule  were opened as a single layer.  Access was found at this point to a dramatic  Bankart lesion.  Motorized bur, curets, rongeurs, were used to roughen the  glenoid neck as well as osteotomes and then the capsule was advanced back up  onto the rim of the shoulder socket with four 3.5 mm suture anchors.  Interestingly, the anterior labrum was torn away from the torn anterior  capsule and this was also repaired.  At this point, the attention was turned  towards the subscap.  The subscap and capsule were advanced superolaterally  with seven interrupted #2 Ethibond sutures.  Excellent repair was achieved  with this.  At this point, the patient could be externally rotated to about  15 degrees and came to a good endpoint  with no tendency towards subluxation  at this point.  At this point, the wound was copiously irrigated and  suctioned dry.  The deltopectoral interval was allowed to  fall back together.  The subcu was closed with 0 and 2-0 Vicryl and the skin  with a 3-0 Maxon pull up suture.  Benzoin and Steri-Strips were applied.  The patient was placed into a shoulder immobilizer.  She was taken to the  recovery room where she was noted to be in satisfactory condition.  Estimated blood loss was 50 mL.                                               Harvie Junior, M.D.    Ranae Plumber  D:  09/04/2003  T:  09/04/2003  Job:  [id number]

## 2011-01-13 NOTE — H&P (Signed)
[facility]  Patient:    Angel Morris, Angel Morris Visit Number: [id number] MRN: [id number]          Service Type: OBS Location: 910A 9104 01 Attending Physician:  Shaune Spittle Dictated by:   Nigel Bridgeman, C.N.M. Admit Date:  09/30/2001                           History and Physical  DATE OF BIRTH:                [birth year]  CHIEF COMPLAINT:              Angel Morris is a 35 year old gravida 6 para 3, 1-1-3, at 14-6/7th weeks by seven week ultrasound, who presents today with onset of low pelvic pressure.  HISTORY OF PRESENT ILLNESS:   She was seen September 27, 2001 in the office for a new OB work-up, with cultures and laboratories done at that time.  The cervix was noted to be shortened on examination.  A plan was made to schedule her for an ultrasound on October 03, 2001 for check of cervical integrity. She did have a history of a cerclage with her last pregnancy and delivered at term, but she has had a previous loss at 22 weeks with an incompetent cervix and premature rupture of the membranes.  In her last pregnancy she has retention of the cerclage even after delivery and then had that removed six to eight weeks postpartum.  PRENATAL LABORATORY DATA:     Blood type is O-positive.  Rh antibody negative. VDRL nonreactive.  Rubella titer positive.  Hepatitis B surface antigen negative.  Sickle cell test negative.  HIV nonreactive.  Hemoglobin was within normal limits.  GC and Chlamydia cultures were negative on September 27, 2001. Wet prep showed positive yeast.  Urine sample was within normal limits on Friday.  PREGNANCY HISTORY:            1. Remarkable for history of previous                                  incompetent cervix with a 22-23 week delivery                                  with premature rupture of membranes.                               2. Cerclage with last pregnancy.  PAST OBSTETRICAL HISTORY:     1. One previous  AB.                               2. Three previous spontaneous vaginal deliveries                                  of term infants - in 1995, 1997, and 2001.                               3. In 2000 she had a 22-23 week pregnancy loss  with dilatation of the cervix and rupture of                                  the membranes.                               4. With her last pregnancy she did have a                                  cerclage placed and delivered at term.  PAST MEDICAL HISTORY:         1. History of kidney stone removed via surgery.                               2. Previous cerclage placed last pregnancy.                               3. Hospitalization also including childbirth x3.                               4. History of gallstones, but has no symptoms                                  from these.  PAST GYNECOLOGIC HISTORY:     History of Chlamydia.  PAST SURGICAL HISTORY:        Open kidney stone removal surgery in approximately 2000.  SOCIAL HISTORY:               The patient is married.  Her husband is present with her and supportive.  She is employed.  FAMILY HISTORY:               Her mother has insulin-dependent diabetes.  Her father has hypertension.  Her son had polydactyly at his delivery.  PHYSICAL EXAMINATION:  VITAL SIGNS:                  Stable.  The patient is afebrile.  HEENT:                        Within normal limits.  LUNGS:                        Bilateral breath sounds clear.  HEART:                        Regular rate and rhythm without murmur.  BREAST:                       Soft, nontender.  ABDOMEN:                      Fundal height approximately 14-15 week size. Abdomen generally nontender, although the patient does report cramping.  BACK:                         Negative CVA tenderness.  GU:  External genitalia normal.  Cervix approximately 1 cm with membranes visible at  introitus, but no bulging of membranes into the vagina noted.  Adnexa nontender.  Vagina, small amount mucus discharge. Uterus generally nontender.  Fetal heart rate is in the 150s by Doppler.  RECTAL:                       Examination deferred.  SKIN:                         Warm and dry.  EXTREMITIES:                  Within normal limits.  There is trace edema noted.  NEUROLOGIC:                   Deep tendon reflexes 2+ without clonus.  LABORATORY DATA:              Urine sample shows positive nitrites, specific gravity 1.025; this was sent to culture.  Group B strep culture was done.  GC and Chlamydia cultures were negative on September 27, 2001.  O-positive blood type was documented.  IMPRESSION:                   1. Intrauterine pregnancy at 14-6/7th weeks.                               2. Incompetent cervix.                               3. History of incompetent cervix with previous                                  preterm delivery.  PLAN:                         1. Admit to [facility] per                                  consult with Dr. Dierdre Forth, the                                  attending physician.                               2. Strict bed rest with Trendelenburg position.                               3. OB ultrasound in the morning for anatomy and                                  cervical length.                               4. Penicillin G per standard dosing for GBS  prophylaxis.                               5. IV hydration with LR.                               6. Ibuprofen 600 mg one p.o. q.6h.                               7. CBC, comprehensive metabolic panel, and                                  ______.                               8. M.D.s will follow.                               9. Possible cerclage during this                                  hospitalization.                               10. Issues of incompetent cervix reviewed with                                  the patient and her husband, and they do                                   wish to proceed with care.   Dictated by: Nigel Bridgeman, C.N.M. Attending Physician:  Shaune Spittle DD:  09/30/01 TD:  09/30/01 Job: [id number]

## 2011-01-13 NOTE — H&P (Signed)
[facility]  Patient:    Angel Morris, Angel Morris                    MRN: [id number] Adm. Date:  [id number] Attending:  Lenoard Aden                         History and Physical  CHIEF COMPLAINT:              Presumed macrosomia at term.  HISTORY OF PRESENT ILLNESS:   Patient is a 35 year old black female, G5, P2-0-2-2, EDD of May 09, 2000, at 39 weeks with a history of cervical incompetence, advanced dilatation, and for induction.  PAST MEDICAL HISTORY:  ALLERGIES:                    Remarkable for no known drug allergies.  MEDICATIONS:                  1. Prenatal vitamins.                               2. Keflex.  PREGNANCY HISTORY:            Remarkable for uncomplicated abortion in 1994; female, 8 pounds 10 ounces, 1995; female, 8 pounds 12 ounces, 1997; 23 week stillbirth complicated by spontaneous rupture of membranes, uncomplicated Staghorn calculus in 1999.  PAST MEDICAL HISTORY:         Otherwise remarkable for no other medical or surgical hospitalizations.  Pregnancy history at this time remarkable for cervical cerclage placed by Dr. Pennie Rushing at 13 weeks, placing transferred care subsequent to that time.  She has had recurrent UTIs, previously on Macrobid prophylaxis treated with Keflex, and now on Keflex prophylaxis.  PHYSICAL EXAMINATION:  GENERAL:                      She is a well-developed, well-nourished black female in no apparent distress.  HEENT:                        Normal.  LUNGS:                        Clear.  HEART:                        Regular rate and rhythm.  ABDOMEN:                      Soft, gravid, nontender.  Estimated weight 8-1/2 pounds.  PELVIC:                       Cervix is 4 cm, 90% vertex, minus 1.  EXTREMITIES:                  Reveal no coarse, _________ , nonfocal.  IMPRESSION:                   1. Term intrauterine pregnancy with favorable                                  cervix.  2. History of cervical incompetence.                               3. Recurrent urinary tract infections.                               4. History of nephrolithiasis.  PLAN:                         Proceed with artificial rupture of membranes with Pitocin augmentation.  Anticipate vaginal delivery. DD:  05/01/00 TD:  05/01/00 Job: [id number]

## 2011-01-13 NOTE — H&P (Signed)
[facility]  Patient:    Angel Morris, Angel Morris Visit Number: [id number] MRN: [id number]          Service Type: OBS Location: 910B 9164 01 Attending Physician:  Shaune Spittle Dictated by:   Wynelle Bourgeois, CNM Admit Date:  03/15/2002                           History and Physical  HISTORY OF PRESENT ILLNESS:   This is a 35 year old female, gravida 6, para 3-1-1-3 at 38-4/7 weeks.  She presents for elective induction secondary to persistent hematuria and known renal calculus.  PRESENT PREGNANCY:            Her pregnancy has been followed by the M.D. service and has been complicated by: 1. History of incompetent cervix (status post cerclage removal at 36 weeks). 2. History of recurrent renal calculi. 3. Hematuria and proteinuria, secondary to renal calculus. 4. Recurrent E. coli UTI, with negative culture in June 2003. 5. Positive Group B strep. 6. Asymptomatic cholelithiasis. 7. History of preterm rupture of membranes, secondary to incompetent cervix.  OBSTETRICAL HISTORY:          An elective AB in 1994 at [redacted] weeks gestation.  A spontaneous vaginal delivery in 1995 of a female infant at [redacted] weeks gestation, weighing 8 pounds 4 ounces; complicated by respiratory distress in the infant.  She had a vaginal delivery in 1997 of a female infant at [redacted] weeks gestation, weighing 8 pounds 5 ounces, with no complications.  She had a spontaneous vaginal delivery in 2000 of a female infant at [redacted] weeks gestation, weighing 1 pound 3 ounces, who was stillborn secondary to preterm premature rupture of membranes and preterm labor.  She had a spontaneous vaginal delivery in 2001 of a female infant at [redacted] weeks gestation, weighing 7 pounds 14 ounces; complicated by preterm labor and she had a cerclage during that pregnancy.  MEDICAL HISTORY:              1. Anemia with pregnancies.                               2. History of fetal deaths secondary  to prematurity in her third baby.                               3. History of gestational diabetes in 2000.                               4. History of chlamydia in 1993, with no further recurrences.                               5. History of renal calculi and chronic cystitis.                               6. History of cholelithiasis (gallstones).                               7. History of childhood asthma, for which she uses no current treatment.  SURGICAL HISTORY:  1. Elective abortion.                               2. Lithotripsy.  FAMILY HISTORY:               Remarkable for chronic hypertension in her mother, father, brother and grandmother.  Diabetes in her mother, brother, grandmother and grandfather.  Rheumatory arthritis in her mother and aunt. Mental illness in her father.  GENETIC HISTORY:              Remarkable for father of the babys cousin who has mental retardation, and the patients son who was born with an extra digit on both hands and one foot.  SOCIAL HISTORY:               The patient is married to [employer] who is involved and supportive.  She does not report a religious affiliation.  She denies any alcohol, tobacco or drug use.  PRENATAL LABS:                Hemoglobin 12.8, platelets 197. Blood type 0 positive.  Antibody screen negative.  Sickle cell negative. Rubella immune.  HbSaG negative.  HIV nonreactive.  Pap test benign reactive changes.  Gonorrhea negative.  Chlamydia negative.  AFP free beta within normal limits.  Group B strep positive.  OBJECTIVE DATA:  VITAL SIGNS:                  Stable.  Initial blood pressures ranged 120-130/70-95.  HEENT:                        Within normal limits.  NECK:                         Thyroid normal and not enlarged.  CHEST:                        Clear to auscultation.  HEART:                        Regular rate and rhythm.  ABDOMEN:                      Gravid.  VFM shows reactive  fetal heart rate tracing, with occasional uterine contractions.  CERVICAL:                     On admission, 4 cm, 80% effaced and -2 station. Hemoglobin on admission 11.0, platelets 163,000.  EXTREMITIES:                  Within normal limits.  ASSESSMENT:                   1. Intrauterine pregnancy at term.                               2. Favorable cervix.                               3. Status post penicillin for group B strep.  4. Hematuria.                               5. Recurrent renal calculi.  PLAN:                         1. Admit to birthing suite, per Dr. Pennie Rushing.                               2. Amniotomy performed for meconium-stained                                  fluid.                               3. Further orders per Dr. Pennie Rushing.                               4. Monitor blood pressures. Dictated by:   Wynelle Bourgeois, CNM Attending Physician:  Shaune Spittle DD:  03/15/02 TD:  03/15/02 Job: [id number]

## 2011-01-13 NOTE — Discharge Summary (Signed)
[facility]  Patient:    Angel Morris, Angel Morris Visit Number: [id number] MRN: [id number]          Service Type: MED Location: 910A 9104 01 Attending Physician:  Shaune Spittle Dictated by:   Mack Guise, C.N.M. Admit Date:  09/30/2001 Discharge Date: 10/02/2001                             Discharge Summary  ADMISSION DIAGNOSES:          1. Intrauterine pregnancy at 14-6/7 weeks.                               2. Incompetent cervix with previous preterm                                  delivery.  PROCEDURE:                    Cervical cerclage.  DISCHARGE DIAGNOSES:          1. Intrauterine pregnancy at 14-6/7 weeks.                               2. Incompetent cervix with previous preterm                                  delivery.  HISTORY OF PRESENT ILLNESS:   Angel Morris is a 35 year old gravida 5, para 3-1-1-3, who presents at 14-6/7 weeks by 7-week ultrasound with onset of low pelvic pressure.  She had a history of three spontaneous vaginal deliveries of term infants and then in the year 2000 she had a 22 to 23-week pregnancy loss with dilation of her cervix and rupture of membranes.  Then with her last pregnancy, she did have a cerclage placed and delivered at term.  HOSPITAL COURSE:              The patient was admitted with complaint of pelvic pressure with minimal cervical change and decision was made to proceed with cervical cerclage.  The patient has remained stable without cramping or contractions or bleeding in the postoperative period.  On her first postoperative day, she is judged to be in satisfactory condition for discharge.  Her vital signs are stable.  Her abdomen is soft and nontender. She is not cramping and not bleeding.  DISCHARGE MEDICATIONS:        1. Indomethacin 25 mg p.o. q.6h.                               2. Terbutaline to use p.r.n. contractions.  FOLLOW-UP:                    She will follow up in  the office of [facility] in six weeks. Dictated by:   Mack Guise, C.N.M. Attending Physician:  Shaune Spittle DD:  10/02/01 TD:  10/03/01 Job: [id number]

## 2011-01-13 NOTE — Discharge Summary (Signed)
NAMECRESSIE, Angel Morris             ACCOUNT NO.:  [id number]   MEDICAL RECORD NO.:  [id number]          PATIENT TYPE:  INP   LOCATION:  1411                         FACILITY:  [facility]   PHYSICIAN:  Jamison Neighbor, M.D.  DATE OF BIRTH:  [birth year]   DATE OF ADMISSION:  03/22/2005  DATE OF DISCHARGE:  03/25/2005                                 DISCHARGE SUMMARY   DISCHARGE DIAGNOSIS:  Pyelonephritis.   SECONDARY DIAGNOSIS:  Cholelithiasis.   HISTORY:  This 35 year old female is known to have staghorn calculi in the  past. The patient has been successfully treated with percutaneous  nephrolithotripsy. It was recommended to her that she go on longterm  antibiotic prophylaxis. When the patient takes her antibiotics she does  well. When she stops those, she tends to have problems. The patient recently  stopped the medication and developed sepsis and high temperature. The  patient ended up being admitted to the hospital for antibiotic therapy. She  had temperatures as high as 103 degrees.   Aside from childhood asthma, her past medical history is unremarkable. She  did have some gestational diabetes as well as anemia during her pregnancies.  The patient does not take medications on a regular basis. We do note that in  the past the patient was on antibiotic prophylaxis.   The patient's initial examination, past medical history, etc. are well  described in the initial history and physical.   The patient was started on antibiotic therapy. She had temperatures that had  gone as high as 102.9 degrees. Her urine was grossly infected and her blood  cultures were negative. The patient was treated with broad spectrum  antibiotics. By March 25, 2005 she was afebrile. She still had some flank  pain, was much improved. She was able to tolerate oral therapy and was  discharged home by Dr. Hardie Lora who was on call at that time.           ______________________________  Jamison Neighbor, M.D.  Electronically Signed     RJE/MEDQ  D:  04/13/2005  T:  04/13/2005  Job:  [id number]

## 2011-04-04 ENCOUNTER — Other Ambulatory Visit: Payer: Self-pay | Admitting: Family Medicine

## 2011-04-25 ENCOUNTER — Encounter: Payer: Self-pay | Admitting: Family Medicine

## 2011-04-26 ENCOUNTER — Encounter: Payer: Self-pay | Admitting: Family Medicine

## 2011-04-28 ENCOUNTER — Encounter: Payer: Self-pay | Admitting: Family Medicine

## 2011-05-02 ENCOUNTER — Ambulatory Visit: Payer: 59 | Admitting: Family Medicine

## 2011-05-26 ENCOUNTER — Other Ambulatory Visit: Payer: Self-pay | Admitting: Obstetrics and Gynecology

## 2011-07-19 ENCOUNTER — Other Ambulatory Visit (HOSPITAL_COMMUNITY)
Admission: RE | Admit: 2011-07-19 | Discharge: 2011-07-19 | Disposition: A | Discharge status: Home / Self Care | Payer: Medicaid Other | Source: Ambulatory Visit | Attending: Obstetrics and Gynecology | Admitting: Obstetrics and Gynecology

## 2011-07-19 ENCOUNTER — Other Ambulatory Visit: Payer: Self-pay | Admitting: Adult Health

## 2011-07-19 DIAGNOSIS — Z113 Encounter for screening for infections with a predominantly sexual mode of transmission: Secondary | ICD-10-CM | POA: Insufficient documentation

## 2011-07-19 DIAGNOSIS — Z01419 Encounter for gynecological examination (general) (routine) without abnormal findings: Secondary | ICD-10-CM | POA: Insufficient documentation

## 2011-12-26 ENCOUNTER — Encounter (HOSPITAL_COMMUNITY): Payer: Self-pay | Admitting: *Deleted

## 2011-12-26 ENCOUNTER — Emergency Department (HOSPITAL_COMMUNITY)
Admission: EM | Admit: 2011-12-26 | Discharge: 2011-12-26 | Disposition: A | Discharge status: Home / Self Care | Payer: Self-pay | Attending: Emergency Medicine | Admitting: Emergency Medicine

## 2011-12-26 DIAGNOSIS — Z79899 Other long term (current) drug therapy: Secondary | ICD-10-CM | POA: Insufficient documentation

## 2011-12-26 DIAGNOSIS — R200 Anesthesia of skin: Secondary | ICD-10-CM

## 2011-12-26 DIAGNOSIS — J45909 Unspecified asthma, uncomplicated: Secondary | ICD-10-CM | POA: Insufficient documentation

## 2011-12-26 DIAGNOSIS — R202 Paresthesia of skin: Secondary | ICD-10-CM

## 2011-12-26 DIAGNOSIS — R209 Unspecified disturbances of skin sensation: Secondary | ICD-10-CM | POA: Insufficient documentation

## 2011-12-26 DIAGNOSIS — I10 Essential (primary) hypertension: Secondary | ICD-10-CM | POA: Insufficient documentation

## 2011-12-26 LAB — POCT PREGNANCY, URINE: Preg Test, Ur: NEGATIVE

## 2011-12-26 NOTE — ED Notes (Signed)
Ambulatory to bathroom to obtain urine specimen via clean catch.  

## 2011-12-26 NOTE — ED Notes (Signed)
Into room to draw blood. Tolerated well. Denies any needs. No distress. Equal chest rise and fall, regular, unlabored. Call bell within reach. Will continue to monitor.

## 2011-12-26 NOTE — Discharge Instructions (Signed)
Neuropathy Neuropathy means your peripheral nerves are not working normally. Peripheral nerves are the nerves outside the brain and spinal cord. Messages between the brain and the rest of the body do not work properly with peripheral nerve disorders. CAUSES There are many different causes of peripheral nerve disorders. These include:  Injury.   Infections.   Diabetes.   Vitamin deficiency.   Poor circulation.   Alcoholism.   Exposure to toxins.   Drug effects.   Tumors.   Kidney disease.  SYMPTOMS  Tingling, burning, pain, and numbness in the extremities.   Weakness and loss of muscle tone and size.  DIAGNOSIS Blood tests and special studies of nerve function may help confirm the diagnosis.  TREATMENT  Treatment includes adopting healthy life habits.   A good diet, vitamin supplements, and mild pain medicine may be needed.   Avoid known toxins such as alcohol, tobacco, and recreational drugs.   Anti-convulsant medicines are helpful in some types of neuropathy.  Make a follow-up appointment with your caregiver to be sure you are getting better with treatment.  SEEK IMMEDIATE MEDICAL CARE IF:   You have breathing problems.   You have severe or uncontrolled pain.   You notice extreme weakness or you feel faint.   You are not better after 1 week or if you have worse symptoms.  Document Released: 09/21/2004 Document Revised: 04/26/2011 Document Reviewed: 08/14/2005 [facility] Patient Information 2012 [place], [place].  RESOURCE GUIDE  Dental Problems  Patients with Medicaid: [facility] [phone] [address]                                           [phone] [address] Phone:  [phone]                                                  Phone:  [phone]  If unable to pay or uninsured, contact:  [facility] or [facility]. to become qualified for the adult dental clinic.  Chronic Pain  Problems Contact [facility]  [phone] Patients need to be referred by their primary care doctor.  Insufficient Money for Medicine Contact [employer]:  call "211" or [facility] 6842909320.  No Primary Care Doctor Call [facility]  [phone] Other agencies that provide inexpensive medical care    [facility]  [phone]    [facility]  [phone]    [facility]  [phone]    [facility]  [phone]    [facility]  [phone]    [facility]  [phone]  Psychological Services [facility]  [phone] [facility] Services  [phone] [facility]   [phone] (emergency services [phone])  Substance Abuse Resources [facility]  [phone] [facility] [phone] [facility] [phone] Floydene Flock [phone] Residential & Outpatient Substance Abuse Program  [phone]  Abuse/Neglect [facility] Child Abuse Hotline (669)758-5114 [facility] Child Abuse Hotline (814) 844-7156 (After Hours)  Emergency Shelter [facility] [phone]  [facility] [place] [facility] [phone] [address] Services [phone]  MRSA Hotline #:   309-022-9124    [facility] Resources  [facility]     [employer]                          Ambulatory Care Center Dept. [address] [place]                       [address]      [address]  Blondell Reveal Phone:  [phone]                                   Phone:  [phone]                 Phone:  [phone]  [facility] Phone:  [phone]  [facility] Child Abuse Hotline 715-018-5311 [phone] (After Hours)

## 2011-12-26 NOTE — ED Notes (Signed)
MD at bedside to evaluate.

## 2011-12-26 NOTE — ED Notes (Signed)
MD at bedside to discuss plan of care. Patient in no distress. Equal chest rise and fall, regular, unlabored.

## 2011-12-26 NOTE — ED Notes (Signed)
C/o numbness in both arms for a couple of months, states numbness is worse in left arm

## 2011-12-26 NOTE — ED Notes (Signed)
Into room to assess patient. Patient states she has had bilateral arm numbness from the elbows down to the fingers. States this has been happening over the past few months but over the past hour has gotten worse, especially in the left arm. Denies numbness going to shoulders. No slurred speech or deficits. Equal and strong bilateral grip strength. Strong radial pulses. Able to feel fingers when touched. In no distress. Denies needs. Call bell and family at bedside.

## 2011-12-30 NOTE — ED Provider Notes (Signed)
History    36 year old female with with numbness to her bilateral upper extremities. Diffuse from upper shoulders to fingers. Gradual onset about 2 months ago. Initially intermittent, but now more or less constant. Patient denies any weakness. She denies any neck pain or upper extremity pain. No rash. Denies history of back surgery. Denies use of blood thinning medication. No other complaints.  CSN: [id number]  Arrival date & time 12/26/11  1909   First MD Initiated Contact with Patient 12/26/11 1917      Chief Complaint  Patient presents with  . Numbness    (Consider location/radiation/quality/duration/timing/severity/associated sxs/prior treatment) HPI  Past Medical History  Diagnosis Date  . Overweight   . History of recurrent UTIs   . Anemia, iron deficiency   . Onychomycosis   . Anxiety   . Proteinuria   . Hypertension   . Asthma   . Nephrolithiasis   . Kidney stones   . Menorrhagia     Past Surgical History  Procedure Date  . Rotary cuff - left shoulder 2005  . Kidney stones june 2000  . Dilation and curettage of uterus     Family History  Problem Relation Age of Onset  . Fibromyalgia Mother   . Arthritis Mother   . Diabetes Mother   . Hypertension Mother   . Diabetes Brother     History  Substance Use Topics  . Smoking status: Never Smoker   . Smokeless tobacco: Not on file  . Alcohol Use: Not on file     rare    OB History    Grav Para Term Preterm Abortions TAB SAB Ect Mult Living                  Review of Systems   Review of symptoms negative unless otherwise noted in HPI.   Allergies  Review of patient's allergies indicates no known allergies.  Home Medications   Current Outpatient Rx  Name Route Sig Dispense Refill  . AMLODIPINE BESYLATE 10 MG PO TABS Oral Take 10 mg by mouth daily.      . B-12 PO Oral Take 1 tablet by mouth daily.    Marland Kitchen FERROUS SULFATE 324 (65 FE) MG PO TBEC Oral Take by mouth 2 (two) times daily before a meal.       . HYDROCHLOROTHIAZIDE 25 MG PO TABS Oral Take 25 mg by mouth daily.      . IBUPROFEN 200 MG PO TABS Oral Take 400 mg by mouth daily as needed. For headache pain    . NORETHINDRONE 0.35 MG PO TABS Oral Take 1 tablet by mouth daily. *Camila*    . PSEUDOEPHEDRINE-IBUPROFEN 30-200 MG PO CAPS Oral Take 2 capsules by mouth once as needed. For cold symptoms    . SUMATRIPTAN SUCCINATE 100 MG PO TABS  TAKE 1 TABLET ONCE AS NEEDED FOR MIGRAINE 10 tablet 0    BP 138/80  Pulse 78  Temp(Src) 98 F (36.7 C) (Oral)  Resp 20  Ht 5\' 5"  (1.651 m)  Wt 160 lb (72.576 kg)  BMI 26.63 kg/m2  SpO2 99%  Physical Exam  Nursing note and vitals reviewed. Constitutional: She appears well-developed and well-nourished. No distress.  HENT:  Head: Normocephalic and atraumatic.  Eyes: Conjunctivae are normal. Pupils are equal, round, and reactive to light. Right eye exhibits no discharge. Left eye exhibits no discharge.  Neck: Normal range of motion. Neck supple.  Cardiovascular: Normal rate, regular rhythm and normal heart sounds.  Exam reveals no  gallop and no friction rub.   No murmur heard. Pulmonary/Chest: Effort normal and breath sounds normal. No respiratory distress.  Abdominal: Soft. She exhibits no distension. There is no tenderness.  Musculoskeletal: She exhibits no edema and no tenderness.       No midline spinal tenderness. Full range of motion of the neck with no increase in symptoms. No overlying skin lesions noted.  Lymphadenopathy:    She has no cervical adenopathy.  Neurological: She is alert. No cranial nerve deficit. She exhibits normal muscle tone. Coordination normal.       Drink is 5 out of 5 bilateral upper extremities. Sensation is  intact to light-touch. Biceps reflexes are 1+ bilaterally.  Skin: Skin is warm and dry.  Psychiatric: She has a normal mood and affect. Her behavior is normal. Thought content normal.    ED Course  Procedures (including critical care time)   Labs  Reviewed  POCT PREGNANCY, URINE  LAB REPORT - SCANNED  PREGNANCY, URINE   No results found.   1. Numbness and tingling       MDM  36 year old female with numbness and tingling to the bilateral upper extremities. No neck pain. Nonfocal neurological examination. Do not feel that emergent imaging is indicated at this time. Referrals to establish PCP were given. Discussed that she may be an MRI. Understands that she needs to return for any type of motor findings.        Raeford Razor, MD 12/30/11 [phone]

## 2012-02-22 ENCOUNTER — Encounter (HOSPITAL_COMMUNITY): Payer: Self-pay | Admitting: *Deleted

## 2012-02-22 ENCOUNTER — Emergency Department (HOSPITAL_COMMUNITY)
Admission: EM | Admit: 2012-02-22 | Discharge: 2012-02-22 | Disposition: A | Discharge status: Home / Self Care | Payer: Self-pay | Attending: Emergency Medicine | Admitting: Emergency Medicine

## 2012-02-22 ENCOUNTER — Emergency Department (HOSPITAL_COMMUNITY): Payer: Self-pay

## 2012-02-22 DIAGNOSIS — F411 Generalized anxiety disorder: Secondary | ICD-10-CM | POA: Insufficient documentation

## 2012-02-22 DIAGNOSIS — J45909 Unspecified asthma, uncomplicated: Secondary | ICD-10-CM | POA: Insufficient documentation

## 2012-02-22 DIAGNOSIS — S39012A Strain of muscle, fascia and tendon of lower back, initial encounter: Secondary | ICD-10-CM

## 2012-02-22 DIAGNOSIS — X58XXXA Exposure to other specified factors, initial encounter: Secondary | ICD-10-CM | POA: Insufficient documentation

## 2012-02-22 DIAGNOSIS — Z87442 Personal history of urinary calculi: Secondary | ICD-10-CM | POA: Insufficient documentation

## 2012-02-22 DIAGNOSIS — Z87440 Personal history of urinary (tract) infections: Secondary | ICD-10-CM | POA: Insufficient documentation

## 2012-02-22 DIAGNOSIS — S335XXA Sprain of ligaments of lumbar spine, initial encounter: Secondary | ICD-10-CM | POA: Insufficient documentation

## 2012-02-22 DIAGNOSIS — D509 Iron deficiency anemia, unspecified: Secondary | ICD-10-CM | POA: Insufficient documentation

## 2012-02-22 DIAGNOSIS — I10 Essential (primary) hypertension: Secondary | ICD-10-CM | POA: Insufficient documentation

## 2012-02-22 DIAGNOSIS — Z79899 Other long term (current) drug therapy: Secondary | ICD-10-CM | POA: Insufficient documentation

## 2012-02-22 LAB — URINALYSIS, ROUTINE W REFLEX MICROSCOPIC
Bilirubin Urine: NEGATIVE
Hgb urine dipstick: NEGATIVE
Nitrite: NEGATIVE
Specific Gravity, Urine: 1.025 (ref 1.005–1.030)
Urobilinogen, UA: 1 mg/dL (ref 0.0–1.0)
pH: 6 (ref 5.0–8.0)

## 2012-02-22 MED ORDER — OXYCODONE-ACETAMINOPHEN 5-325 MG PO TABS: 1.0000 | ORAL_TABLET | ORAL | Status: AC | PRN

## 2012-02-22 MED ORDER — CYCLOBENZAPRINE HCL 5 MG PO TABS: 5.0000 mg | ORAL_TABLET | Freq: Three times a day (TID) | ORAL | Status: AC | PRN

## 2012-02-22 NOTE — ED Notes (Signed)
Pt c/o pain in her lower back x 1 week. Denies urinary symptoms or injury.

## 2012-02-22 NOTE — ED Notes (Signed)
States that she started having lower back pain about 1 week ago, states that she noticed cloudy urine today.

## 2012-02-22 NOTE — Discharge Instructions (Signed)
Lumbosacral Strain Lumbosacral strain is one of the most common causes of back pain. There are many causes of back pain. Most are not serious conditions. CAUSES  Your backbone (spinal column) is made up of 24 main vertebral bodies, the sacrum, and the coccyx. These are held together by muscles and tough, fibrous tissue (ligaments). Nerve roots pass through the openings between the vertebrae. A sudden move or injury to the back may cause injury to, or pressure on, these nerves. This may result in localized back pain or pain movement (radiation) into the buttocks, down the leg, and into the foot. Sharp, shooting pain from the buttock down the back of the leg (sciatica) is frequently associated with a ruptured (herniated) disk. Pain may be caused by muscle spasm alone. Your caregiver can often find the cause of your pain by the details of your symptoms and an exam. In some cases, you may need tests (such as X-rays). Your caregiver will work with you to decide if any tests are needed based on your specific exam. HOME CARE INSTRUCTIONS   Avoid an underactive lifestyle. Active exercise, as directed by your caregiver, is your greatest weapon against back pain.   Avoid hard physical activities (tennis, racquetball, waterskiing) if you are not in proper physical condition for it. This may aggravate or create problems.   If you have a back problem, avoid sports requiring sudden body movements. Swimming and walking are generally safer activities.   Maintain good posture.   Avoid becoming overweight (obese).   Use bed rest for only the most extreme, sudden (acute) episode. Your caregiver will help you determine how much bed rest is necessary.   For acute conditions, you may put ice on the injured area.   Put ice in a plastic bag.   Place a towel between your skin and the bag.   Leave the ice on for 15 to 20 minutes at a time, every 2 hours, or as needed.   After you are improved and more active, it  may help to apply heat for 30 minutes before activities.  See your caregiver if you are having pain that lasts longer than expected. Your caregiver can advise appropriate exercises or therapy if needed. With conditioning, most back problems can be avoided. SEEK IMMEDIATE MEDICAL CARE IF:   You have numbness, tingling, weakness, or problems with the use of your arms or legs.   You experience severe back pain not relieved with medicines.   There is a change in bowel or bladder control.   You have increasing pain in any area of the body, including your belly (abdomen).   You notice shortness of breath, dizziness, or feel faint.   You feel sick to your stomach (nauseous), are throwing up (vomiting), or become sweaty.   You notice discoloration of your toes or legs, or your feet get very cold.   Your back pain is getting worse.   You have a fever.  MAKE SURE YOU:   Understand these instructions.   Will watch your condition.   Will get help right away if you are not doing well or get worse.  Document Released: 05/24/2005 Document Revised: 08/03/2011 Document Reviewed: 11/13/2008 [facility] Patient Information 2012 [place], [place].    Do not drive within 4 hours of taking oxycodone as this will make you drowsy.  Avoid lifting,  Bending,  Twisting or any other activity that worsens your pain over the next week.  Apply an  icepack  to your lower back  for 10-15 minutes every 2 hours for the next 2 days.  You should get rechecked if your symptoms are not better over the next 5 days,  Or you develop increased pain,  Weakness in your leg(s) or loss of bladder or bowel function - these are symptoms of a worse injury.

## 2012-02-26 MED FILL — Oxycodone w/ Acetaminophen Tab 5-325 MG: ORAL | Qty: 6 | Status: AC

## 2012-02-26 NOTE — ED Provider Notes (Signed)
History     CSN: [id number]  Arrival date & time 02/22/12  1750   First MD Initiated Contact with Patient 02/22/12 1918      Chief Complaint  Patient presents with  . Back Pain    (Consider location/radiation/quality/duration/timing/severity/associated sxs/prior treatment) HPI Comments: Angel Morris presents with acute  low back pain which has been present for the past week.  Patient denies any new injury specifically.  There is not radiation into the lower extremities.  There has been no weakness or numbness in the lower extremities and no urinary or bowel retention or incontinence.  She has noticed her urine was a little cloudier than normal today.  She denies fevers,  Chills,  Urinary frequency or pain with urination.  She does have a history of kidney stones,  But states her pain does not feel like prior episodes of stone passage.  She is followed by Dr. Logan Bores with [facility],  And states her last stone was a right sided staghorn calculus whic was removed 2 years ago.  Pain is worse with certain movements and better at rest.  She has taken ibuprofen without relief.   The history is provided by the patient.    Past Medical History  Diagnosis Date  . Overweight   . History of recurrent UTIs   . Anemia, iron deficiency   . Onychomycosis   . Anxiety   . Proteinuria   . Hypertension   . Asthma   . Nephrolithiasis   . Kidney stones   . Menorrhagia     Past Surgical History  Procedure Date  . Rotary cuff - left shoulder 2005  . Kidney stones june 2000  . Dilation and curettage of uterus     Family History  Problem Relation Age of Onset  . Fibromyalgia Mother   . Arthritis Mother   . Diabetes Mother   . Hypertension Mother   . Diabetes Brother     History  Substance Use Topics  . Smoking status: Never Smoker   . Smokeless tobacco: Not on file  . Alcohol Use: Yes     rare    OB History    Grav Para Term Preterm Abortions TAB SAB Ect Mult Living                    Review of Systems  Constitutional: Negative for fever.  HENT: Negative for congestion, sore throat and neck pain.   Eyes: Negative.   Respiratory: Negative for chest tightness and shortness of breath.   Cardiovascular: Negative for chest pain and leg swelling.  Gastrointestinal: Negative for nausea, abdominal pain, constipation and abdominal distention.  Genitourinary: Negative.   Musculoskeletal: Positive for back pain. Negative for joint swelling, arthralgias and gait problem.  Skin: Negative.  Negative for rash and wound.  Neurological: Negative for dizziness, weakness, light-headedness, numbness and headaches.  Hematological: Negative.   Psychiatric/Behavioral: Negative.     Allergies  Review of patient's allergies indicates no known allergies.  Home Medications   Current Outpatient Rx  Name Route Sig Dispense Refill  . AMLODIPINE BESYLATE 10 MG PO TABS Oral Take 10 mg by mouth daily.      . B-12 PO Oral Take 1 tablet by mouth daily.    . CYCLOBENZAPRINE HCL 5 MG PO TABS Oral Take 1 tablet (5 mg total) by mouth 3 (three) times daily as needed for muscle spasms. 15 tablet 0  . FERROUS SULFATE 324 (65 FE) MG PO TBEC Oral Take  by mouth 2 (two) times daily before a meal.      . HYDROCHLOROTHIAZIDE 25 MG PO TABS Oral Take 25 mg by mouth daily.      . IBUPROFEN 200 MG PO TABS Oral Take 400 mg by mouth daily as needed. For headache pain    . NORETHINDRONE 0.35 MG PO TABS Oral Take 1 tablet by mouth daily. *Camila*    . OXYCODONE-ACETAMINOPHEN 5-325 MG PO TABS Oral Take 1 tablet by mouth every 4 (four) hours as needed for pain. 6 tablet 0    For home use  . OXYCODONE-ACETAMINOPHEN 5-325 MG PO TABS Oral Take 1 tablet by mouth every 4 (four) hours as needed for pain. 15 tablet 0  . PSEUDOEPHEDRINE-IBUPROFEN 30-200 MG PO CAPS Oral Take 2 capsules by mouth once as needed. For cold symptoms    . SUMATRIPTAN SUCCINATE 100 MG PO TABS  TAKE 1 TABLET ONCE AS NEEDED FOR MIGRAINE 10 tablet 0     BP 144/73  Pulse 62  Temp 98.8 F (37.1 C) (Oral)  Resp 18  Ht 5\' 5"  (1.651 m)  Wt 161 lb (73.029 kg)  BMI 26.79 kg/m2  SpO2 100%  LMP 01/27/2012  Physical Exam  Nursing note and vitals reviewed. Constitutional: She appears well-developed and well-nourished.  HENT:  Head: Normocephalic.  Eyes: Conjunctivae are normal.  Neck: Normal range of motion. Neck supple.  Cardiovascular: Normal rate and intact distal pulses.        Pedal pulses normal.  Pulmonary/Chest: Effort normal.  Abdominal: Soft. Bowel sounds are normal. She exhibits no distension and no mass. There is no CVA tenderness.  Musculoskeletal: Normal range of motion. She exhibits no edema.       Lumbar back: She exhibits tenderness. She exhibits no swelling, no edema and no spasm.       Paralumbar ttp.  No midline ttp,  No cva tenderness.  Neurological: She is alert. She has normal strength. She displays no atrophy and no tremor. No sensory deficit. Gait normal.  Reflex Scores:      Patellar reflexes are 2+ on the right side and 2+ on the left side.      Achilles reflexes are 2+ on the right side and 2+ on the left side.      No strength deficit noted in hip and knee flexor and extensor muscle groups.  Ankle flexion and extension intact.  Skin: Skin is warm and dry.  Psychiatric: She has a normal mood and affect.    ED Course  Procedures (including critical care time)  Labs Reviewed  URINALYSIS, ROUTINE W REFLEX MICROSCOPIC - Abnormal; Notable for the following:    Color, Urine AMBER (*)  BIOCHEMICALS MAY BE AFFECTED BY COLOR   All other components within normal limits  LAB REPORT - SCANNED   No results found.   1. Strain of lumbar spine       MDM  xrays reviewed,  Labs reviewed.  Pt with low back pain which is positional ,  Sx do not suggest kidney stone as source of pain.  No neuro deficit on exam or by history to suggest emergent or surgical presentation.  Pt prescribed flexeril,  Oxycodone,   Caution given regarding sedation.  Heat,  Avoid lifting,  Bending,  Etc.  Recheck for worsened scx.          Burgess Amor, [place] 02/26/12 2259

## 2012-02-27 NOTE — ED Provider Notes (Signed)
Medical screening examination/treatment/procedure(s) were performed by non-physician practitioner and as supervising physician I was immediately available for consultation/collaboration.   Akansha Wyche, MD 02/27/12 0701 

## 2012-07-23 ENCOUNTER — Other Ambulatory Visit (HOSPITAL_COMMUNITY)
Admission: RE | Admit: 2012-07-23 | Discharge: 2012-07-23 | Disposition: A | Discharge status: Home / Self Care | Payer: Medicaid Other | Source: Ambulatory Visit | Attending: Obstetrics and Gynecology | Admitting: Obstetrics and Gynecology

## 2012-07-23 ENCOUNTER — Other Ambulatory Visit: Payer: Self-pay | Admitting: Adult Health

## 2012-07-23 DIAGNOSIS — Z113 Encounter for screening for infections with a predominantly sexual mode of transmission: Secondary | ICD-10-CM | POA: Insufficient documentation

## 2012-07-23 DIAGNOSIS — Z1151 Encounter for screening for human papillomavirus (HPV): Secondary | ICD-10-CM | POA: Insufficient documentation

## 2012-07-23 DIAGNOSIS — Z3049 Encounter for surveillance of other contraceptives: Secondary | ICD-10-CM | POA: Insufficient documentation

## 2013-01-13 ENCOUNTER — Telehealth: Payer: Self-pay | Admitting: *Deleted

## 2013-01-13 NOTE — Telephone Encounter (Signed)
Bleeding x 5 weeks wants to be seen appt 5/22 at 4pm

## 2013-01-13 NOTE — Telephone Encounter (Signed)
Spoke with pt. Wants to talk to [place] only. JSY

## 2013-01-15 ENCOUNTER — Encounter: Payer: Self-pay | Admitting: *Deleted

## 2013-01-16 ENCOUNTER — Encounter: Payer: Self-pay | Admitting: Adult Health

## 2013-01-16 ENCOUNTER — Ambulatory Visit (INDEPENDENT_AMBULATORY_CARE_PROVIDER_SITE_OTHER): Payer: Medicaid Other | Admitting: Adult Health

## 2013-01-16 VITALS — BP 140/80 | Ht 65.0 in | Wt 176.0 lb

## 2013-01-16 DIAGNOSIS — I10 Essential (primary) hypertension: Secondary | ICD-10-CM

## 2013-01-16 DIAGNOSIS — Z309 Encounter for contraceptive management, unspecified: Secondary | ICD-10-CM

## 2013-01-16 DIAGNOSIS — Z3049 Encounter for surveillance of other contraceptives: Secondary | ICD-10-CM

## 2013-01-16 DIAGNOSIS — N926 Irregular menstruation, unspecified: Secondary | ICD-10-CM

## 2013-01-16 LAB — POCT WET PREP (WET MOUNT)
Trichomonas Wet Prep HPF POC: NEGATIVE
WBC, Wet Prep HPF POC: POSITIVE

## 2013-01-16 LAB — POCT URINE PREGNANCY: Preg Test, Ur: NEGATIVE

## 2013-01-16 MED ORDER — HYDROCHLOROTHIAZIDE 25 MG PO TABS: 25.0000 mg | ORAL_TABLET | Freq: Every day | ORAL | Status: DC

## 2013-01-16 NOTE — Progress Notes (Signed)
Subjective:     Patient ID: Angel Morris, female   DOB: [birth year], 37 y.o.   MRN: [id number]  HPI Angel Morris is a 37 year old black female in complaining of bleeding since elective abortion 4/12. The bleeding stopped 5/20. She is taking tri sprintec. No sex yet.  And she was refill on HCTZ  Review of Systems Positives as in HPI Reviewed past medical,surgical, social and family history. Reviewed medications and allergies.    Objective:   Physical Exam Blood pressure 140/80, height 5\' 5"  (1.651 m), weight 176 lb (79.833 kg). Urine pregnancy test was negative.Skin warm and dry.Pelvic: external genitalia is normal in appearance, vagina: bloody discharge, negative CMT, cervix:smooth and bulbous, uterus: normal size, shape and contour, non tender, no masses felt, adnexa: no masses or tenderness noted. Wet prep: + for RBCs and +WBCs. GC/CHL obtained.     Assessment:    Irregular bleeding s/p elective abortion Hypertension Contraceptive management    Plan:      Continue tri sprintec for now Refilled HCTZ 1 daily with 11 refills at [facility] in [place] Follow up in 2 weeks to check BP  Call in am for GC/CHL results

## 2013-01-16 NOTE — Patient Instructions (Addendum)
Follow-up in 2 weeks

## 2013-01-17 LAB — GC/CHLAMYDIA PROBE AMP: GC Probe RNA: NEGATIVE

## 2013-01-30 ENCOUNTER — Ambulatory Visit: Payer: Medicaid Other | Admitting: Adult Health

## 2013-02-11 ENCOUNTER — Encounter: Payer: Self-pay | Admitting: Adult Health

## 2013-02-11 ENCOUNTER — Ambulatory Visit (INDEPENDENT_AMBULATORY_CARE_PROVIDER_SITE_OTHER): Payer: Medicaid Other | Admitting: Adult Health

## 2013-02-11 VITALS — BP 128/86 | Ht 65.0 in | Wt 177.0 lb

## 2013-02-11 DIAGNOSIS — Z309 Encounter for contraceptive management, unspecified: Secondary | ICD-10-CM

## 2013-02-11 DIAGNOSIS — Z3049 Encounter for surveillance of other contraceptives: Secondary | ICD-10-CM

## 2013-02-11 DIAGNOSIS — I10 Essential (primary) hypertension: Secondary | ICD-10-CM

## 2013-02-11 MED ORDER — AMLODIPINE BESYLATE 10 MG PO TABS: 10.0000 mg | ORAL_TABLET | Freq: Every day | ORAL | Status: DC

## 2013-02-11 NOTE — Progress Notes (Signed)
Subjective:     Patient ID: [name], female   DOB: December 21, 1975, 37 y.o.   MRN: [id number]  HPI Angel Morris is in for a BP check and see how she is doing on the birth control pills.She has some leg cramps with HCTZ and started taking them every other day, and the cramps are better.  Review of Systems Positives in HPI  Reviewed past medical,surgical, social and family history. Reviewed medications and allergies.     Objective:   Physical Exam BP 128/86  Ht 5\' 5"  (1.651 m)  Wt 177 lb (80.287 kg)  BMI 29.45 kg/m2  LMP 06/06/2014talk only, has occasional vaginal odor, feels good and wants to continue taking OCs.    Assessment:      Contraceptive management Hypertension    Plan:     Refilled norvasc 10 mg #30 1 daily with 11 refills Try 1/2 of the HCTZ daily Decrease salt Increase walking Follow up in 3 months or prn problems Try rephresh

## 2013-02-11 NOTE — Patient Instructions (Addendum)
Watch salt Increase walking Check BP at home  Try 1/2 HCTZ  Follow up in 3 months

## 2013-05-05 ENCOUNTER — Other Ambulatory Visit: Payer: Self-pay | Admitting: *Deleted

## 2013-05-06 MED ORDER — NORGESTIM-ETH ESTRAD TRIPHASIC 0.18/0.215/0.25 MG-25 MCG PO TABS: 1.0000 | ORAL_TABLET | Freq: Every day | ORAL | Status: DC

## 2013-05-07 ENCOUNTER — Emergency Department (HOSPITAL_COMMUNITY)
Admission: EM | Admit: 2013-05-07 | Discharge: 2013-05-07 | Disposition: A | Discharge status: Home / Self Care | Payer: 59 | Attending: Emergency Medicine | Admitting: Emergency Medicine

## 2013-05-07 ENCOUNTER — Encounter (HOSPITAL_COMMUNITY): Payer: Self-pay | Admitting: *Deleted

## 2013-05-07 ENCOUNTER — Telehealth: Payer: Self-pay | Admitting: Adult Health

## 2013-05-07 DIAGNOSIS — D649 Anemia, unspecified: Secondary | ICD-10-CM | POA: Insufficient documentation

## 2013-05-07 DIAGNOSIS — Z87442 Personal history of urinary calculi: Secondary | ICD-10-CM | POA: Insufficient documentation

## 2013-05-07 DIAGNOSIS — Z79899 Other long term (current) drug therapy: Secondary | ICD-10-CM | POA: Insufficient documentation

## 2013-05-07 DIAGNOSIS — R112 Nausea with vomiting, unspecified: Secondary | ICD-10-CM | POA: Insufficient documentation

## 2013-05-07 DIAGNOSIS — I10 Essential (primary) hypertension: Secondary | ICD-10-CM

## 2013-05-07 DIAGNOSIS — Z8742 Personal history of other diseases of the female genital tract: Secondary | ICD-10-CM | POA: Insufficient documentation

## 2013-05-07 DIAGNOSIS — Z8619 Personal history of other infectious and parasitic diseases: Secondary | ICD-10-CM | POA: Insufficient documentation

## 2013-05-07 DIAGNOSIS — Z8639 Personal history of other endocrine, nutritional and metabolic disease: Secondary | ICD-10-CM | POA: Insufficient documentation

## 2013-05-07 DIAGNOSIS — Z862 Personal history of diseases of the blood and blood-forming organs and certain disorders involving the immune mechanism: Secondary | ICD-10-CM | POA: Insufficient documentation

## 2013-05-07 DIAGNOSIS — Z8659 Personal history of other mental and behavioral disorders: Secondary | ICD-10-CM | POA: Insufficient documentation

## 2013-05-07 DIAGNOSIS — N390 Urinary tract infection, site not specified: Secondary | ICD-10-CM | POA: Insufficient documentation

## 2013-05-07 DIAGNOSIS — J45909 Unspecified asthma, uncomplicated: Secondary | ICD-10-CM | POA: Insufficient documentation

## 2013-05-07 LAB — URINALYSIS, ROUTINE W REFLEX MICROSCOPIC
Glucose, UA: NEGATIVE mg/dL
Ketones, ur: NEGATIVE mg/dL
pH: 6 (ref 5.0–8.0)

## 2013-05-07 LAB — COMPREHENSIVE METABOLIC PANEL
ALT: 11 U/L (ref 0–35)
AST: 18 U/L (ref 0–37)
Alkaline Phosphatase: 68 U/L (ref 39–117)
CO2: 31 mEq/L (ref 19–32)
Calcium: 10.3 mg/dL (ref 8.4–10.5)
Chloride: 95 mEq/L — ABNORMAL LOW (ref 96–112)
GFR calc non Af Amer: >90 mL/min (ref >90)
Glucose, Bld: 125 mg/dL — ABNORMAL HIGH (ref 70–99)
Potassium: 3.2 mEq/L — ABNORMAL LOW (ref 3.5–5.1)
Sodium: 137 mEq/L (ref 135–145)

## 2013-05-07 LAB — URINE MICROSCOPIC-ADD ON

## 2013-05-07 LAB — CBC WITH DIFFERENTIAL/PLATELET
Basophils Absolute: 0 10*3/uL (ref 0.0–0.1)
Lymphocytes Relative: 28 % (ref 12–46)
Lymphs Abs: 2.9 10*3/uL (ref 0.7–4.0)
Neutro Abs: 6.8 10*3/uL (ref 1.7–7.7)
Neutrophils Relative %: 66 % (ref 43–77)
Platelets: 219 10*3/uL (ref 150–400)
RBC: 5.29 MIL/uL — ABNORMAL HIGH (ref 3.87–5.11)
RDW: 13.9 % (ref 11.5–15.5)
WBC: 10.3 10*3/uL (ref 4.0–10.5)

## 2013-05-07 MED ORDER — CEPHALEXIN 500 MG PO CAPS
500.0000 mg | ORAL_CAPSULE | Freq: Once | ORAL | Status: AC
  Administered 2013-05-07: 500 mg via ORAL
  Filled 2013-05-07: qty 1

## 2013-05-07 MED ORDER — ONDANSETRON HCL 4 MG/2ML IJ SOLN
4.0000 mg | Freq: Once | INTRAMUSCULAR | Status: AC
  Administered 2013-05-07: 4 mg via INTRAVENOUS
  Filled 2013-05-07: qty 2

## 2013-05-07 MED ORDER — CEPHALEXIN 500 MG PO CAPS: 500.0000 mg | ORAL_CAPSULE | Freq: Four times a day (QID) | ORAL | Status: DC

## 2013-05-07 MED ORDER — ONDANSETRON HCL 4 MG PO TABS: 4.0000 mg | ORAL_TABLET | Freq: Four times a day (QID) | ORAL | Status: DC

## 2013-05-07 NOTE — Telephone Encounter (Signed)
Wants to talk to JAG about feeling really bad. Hasn't had the money to refill BP medication. Started taking BP medication Sunday.  Pt having headaches N/V. Took preg test and it was neg. BP was 180/114, 187/113, 177/109. Pt denies any blurred vision. Pt wants to know should she go to ER. I spoke with JAG and she advised that the pt should go to the ER because of the BP and because she feels bad. Pt verbalized understanding.

## 2013-05-07 NOTE — ED Provider Notes (Signed)
CSN: [id number]     Arrival date & time 05/07/13  1736 History  This chart was scribed for Angel Crease, MD by Greggory Stallion, ED Scribe. This patient was seen in room APA04/APA04 and the patient's care was started at 8:28 PM.   Chief Complaint  Patient presents with  . Nausea  . Dizziness  . Hypertension   The history is provided by the patient. No language interpreter was used.    HPI Comments: Angel Morris is a 37 y.o. female who presents to the Emergency Department complaining of dizziness, nausea and emesis that started 6 days ago. She took a pregnancy test just in case and it was negative. Pt states she checked her blood pressure today and it was high so she called her gynecologist and was told to come here. She denies fever, chills, diarrhea and abdominal pain as associated symptoms.   Past Medical History  Diagnosis Date  . Overweight(278.02)   . History of recurrent UTIs   . Anemia, iron deficiency   . Onychomycosis   . Anxiety   . Proteinuria   . Hypertension   . Asthma   . Menorrhagia   . Nephrolithiasis   . Kidney stones   . Uterine fibroid   . Abnormal pap     lSIL   . Human papilloma virus   . Abnormal Pap smear    Past Surgical History  Procedure Laterality Date  . Rotary cuff - left shoulder  2005  . Kidney stones  june 2000  . Dilation and curettage of uterus     Family History  Problem Relation Age of Onset  . Fibromyalgia Mother   . Arthritis Mother   . Diabetes Mother   . Hypertension Mother   . Diabetes Brother   . Stroke Other    History  Substance Use Topics  . Smoking status: Never Smoker   . Smokeless tobacco: Never Used  . Alcohol Use: Yes     Comment: rare   OB History   Grav Para Term Preterm Abortions TAB SAB Ect Mult Living   7 4   3  3         Review of Systems  Constitutional: Negative for fever and chills.  Gastrointestinal: Positive for nausea and vomiting. Negative for abdominal pain and diarrhea.   Neurological: Positive for dizziness and light-headedness.  All other systems reviewed and are negative.    Allergies  Review of patient's allergies indicates no known allergies.  Home Medications   Current Outpatient Rx  Name  Route  Sig  Dispense  Refill  . amLODipine (NORVASC) 10 MG tablet   Oral   Take 1 tablet (10 mg total) by mouth daily.   30 tablet   11   . ferrous sulfate 324 (65 FE) MG TBEC   Oral   Take by mouth 2 (two) times daily before a meal.           . hydrochlorothiazide (HYDRODIURIL) 25 MG tablet   Oral   Take 1 tablet (25 mg total) by mouth daily.   30 tablet   11   . ibuprofen (ADVIL,MOTRIN) 200 MG tablet   Oral   Take 400 mg by mouth daily as needed. For headache pain         . Norgestimate-Ethinyl Estradiol Triphasic (ORTHO TRI-CYCLEN LO) 0.18/0.215/0.25 MG-25 MCG tab   Oral   Take 1 tablet by mouth daily.   1 Package   12   . Pseudoephedrine-Ibuprofen (ADVIL  COLD & SINUS LIQUI-GELS) 30-200 MG CAPS   Oral   Take 2 capsules by mouth once as needed. For cold symptoms         . SUMAtriptan (IMITREX) 100 MG tablet      TAKE 1 TABLET ONCE AS NEEDED FOR MIGRAINE   10 tablet   0    BP 157/116  Pulse 84  Temp(Src) 98.3 F (36.8 C) (Oral)  Resp 18  Ht 5' 4.5" (1.638 m)  Wt 178 lb (80.74 kg)  BMI 30.09 kg/m2  SpO2 97%  LMP 04/30/2013  Physical Exam  Constitutional: She is oriented to person, place, and time. She appears well-developed and well-nourished. No distress.  HENT:  Head: Normocephalic and atraumatic.  Right Ear: Hearing normal.  Left Ear: Hearing normal.  Nose: Nose normal.  Mouth/Throat: Oropharynx is clear and moist and mucous membranes are normal.  Eyes: Conjunctivae and EOM are normal. Pupils are equal, round, and reactive to light.  Neck: Normal range of motion. Neck supple.  Cardiovascular: Regular rhythm, S1 normal and S2 normal.  Exam reveals no gallop and no friction rub.   No murmur  heard. Pulmonary/Chest: Effort normal and breath sounds normal. No respiratory distress. She exhibits no tenderness.  Abdominal: Soft. Normal appearance and bowel sounds are normal. There is no hepatosplenomegaly. There is no tenderness. There is no rebound, no guarding, no tenderness at McBurney's point and negative Murphy's sign. No hernia.  Musculoskeletal: Normal range of motion.  Neurological: She is alert and oriented to person, place, and time. She has normal strength. No cranial nerve deficit or sensory deficit. Coordination normal. GCS eye subscore is 4. GCS verbal subscore is 5. GCS motor subscore is 6.  Skin: Skin is warm, dry and intact. No rash noted. No cyanosis.  Psychiatric: She has a normal mood and affect. Her speech is normal and behavior is normal. Thought content normal.    ED Course  Procedures (including critical care time)  DIAGNOSTIC STUDIES: Oxygen Saturation is 97% on RA, normal by my interpretation.    COORDINATION OF CARE: 8:32 PM-Discussed treatment plan which includes labs with pt at bedside and pt agreed to plan.   Labs Review Labs Reviewed  CBC WITH DIFFERENTIAL - Abnormal; Notable for the following:    RBC 5.29 (*)    All other components within normal limits  COMPREHENSIVE METABOLIC PANEL - Abnormal; Notable for the following:    Potassium 3.2 (*)    Chloride 95 (*)    Glucose, Bld 125 (*)    Total Protein 8.4 (*)    All other components within normal limits  URINALYSIS, ROUTINE W REFLEX MICROSCOPIC - Abnormal; Notable for the following:    APPearance CLOUDY (*)    Hgb urine dipstick TRACE (*)    Protein, ur 30 (*)    Leukocytes, UA SMALL (*)    All other components within normal limits  URINE MICROSCOPIC-ADD ON - Abnormal; Notable for the following:    Bacteria, UA MANY (*)    All other components within normal limits  URINE CULTURE  LIPASE, BLOOD  TROPONIN I   Imaging Review No results found.  MDM  Patient presents to the ER for  evaluation of dizziness, nausea, vomiting. Patient reports that she was recently started on blood pressure medication, but her blood pressure remains high. Neurologic examination was unremarkable. Remainder of examination was also unremarkable. Workup reveals normal blood work, but she does have evidence of urinary tract infection which might be causing some of  her symptoms. Blood pressure remained slightly high, but she has recently started the medication, we'll not make any adjustments currently. I recommended she follow up with primary doctor for recheck in a week, will treat with cephalosporin antibiotic.  I personally performed the services described in this documentation, which was scribed in my presence. The recorded information has been reviewed and is accurate.   Angel Crease, MD 05/07/13 2248

## 2013-05-07 NOTE — ED Notes (Signed)
Pt with N/V and dizziness for several days, started HTN med, states BP high at home with home BP machine

## 2013-05-08 ENCOUNTER — Telehealth: Payer: Self-pay | Admitting: *Deleted

## 2013-05-09 LAB — URINE CULTURE: Colony Count: 10000

## 2013-05-09 MED ORDER — NORGESTIM-ETH ESTRAD TRIPHASIC 0.18/0.215/0.25 MG-35 MCG PO TABS: 1.0000 | ORAL_TABLET | Freq: Every day | ORAL | Status: DC

## 2013-05-09 NOTE — Telephone Encounter (Signed)
Ordered tri sprintec at pt request

## 2013-05-09 NOTE — Telephone Encounter (Signed)
Spoke with pt. Pt has been on Trisprintec and wants to continue that but pharmacy filled Ortho Tri-Cyclen Lo. Can you order Trisprintec for pt? Thanks!!!

## 2013-05-10 ENCOUNTER — Telehealth (HOSPITAL_COMMUNITY): Payer: Self-pay | Admitting: Emergency Medicine

## 2013-05-10 NOTE — ED Notes (Signed)
Post ED Visit - Positive Culture Follow-up  Culture report reviewed by antimicrobial stewardship pharmacist: []  Wes Dulaney, Pharm.D., BCPS [x]  Celedonio Miyamoto, Pharm.D., BCPS []  Georgina Pillion, [address].D., BCPS []  [place], [address].D., BCPS, AAHIVP []  Estella Husk, Pharm.D., BCPS, AAHIVP  Positive urine culture Treated with Keflex, organism sensitive to the same and no further patient follow-up is required at this time.  Kylie A Holland 05/10/2013, 11:46 AM

## 2013-05-14 ENCOUNTER — Ambulatory Visit: Payer: Medicaid Other | Admitting: Adult Health

## 2013-05-20 ENCOUNTER — Ambulatory Visit: Payer: Medicaid Other | Admitting: Adult Health

## 2013-05-27 ENCOUNTER — Ambulatory Visit (INDEPENDENT_AMBULATORY_CARE_PROVIDER_SITE_OTHER): Payer: Medicaid Other | Admitting: Adult Health

## 2013-05-27 ENCOUNTER — Encounter: Payer: Self-pay | Admitting: Adult Health

## 2013-05-27 VITALS — BP 136/90 | Ht 65.0 in | Wt 180.5 lb

## 2013-05-27 DIAGNOSIS — Z309 Encounter for contraceptive management, unspecified: Secondary | ICD-10-CM

## 2013-05-27 DIAGNOSIS — I10 Essential (primary) hypertension: Secondary | ICD-10-CM

## 2013-05-27 DIAGNOSIS — Z3049 Encounter for surveillance of other contraceptives: Secondary | ICD-10-CM

## 2013-05-27 NOTE — Progress Notes (Signed)
Subjective:     Patient ID: Angel Morris, female   DOB: 05/07/1976, 37 y.o.   MRN: [id number]  HPI Anaid is in to discuss birth control and check BP, she has no complaints, she is happy with the tri sprintec.Seen in ER for UTI recently.  Review of Systems See HPI Reviewed past medical,surgical, social and family history. Reviewed medications and allergies.     Objective:   Physical Exam BP 136/90  Ht 5\' 5"  (1.651 m)  Wt 180 lb 8 oz (81.874 kg)  BMI 30.04 kg/m2  LMP 04/30/2013   Discussed the pills and BP will continue for now  Assessment:     Contraceptive management Hypertension     Plan:     Continue medications and follow up in 2 months for pap and physical   Call any problems

## 2013-05-27 NOTE — Patient Instructions (Addendum)
Continue meds Get pap in end of November Call any problems

## 2013-07-28 ENCOUNTER — Ambulatory Visit (INDEPENDENT_AMBULATORY_CARE_PROVIDER_SITE_OTHER): Payer: Medicaid Other | Admitting: Adult Health

## 2013-07-28 ENCOUNTER — Encounter (INDEPENDENT_AMBULATORY_CARE_PROVIDER_SITE_OTHER): Payer: Self-pay

## 2013-07-28 ENCOUNTER — Encounter: Payer: Self-pay | Admitting: Adult Health

## 2013-07-28 ENCOUNTER — Other Ambulatory Visit (HOSPITAL_COMMUNITY)
Admission: RE | Admit: 2013-07-28 | Discharge: 2013-07-28 | Disposition: A | Discharge status: Home / Self Care | Payer: Medicaid Other | Source: Ambulatory Visit | Attending: Adult Health | Admitting: Adult Health

## 2013-07-28 VITALS — BP 130/100 | HR 78 | Ht 64.0 in | Wt 179.0 lb

## 2013-07-28 DIAGNOSIS — Z01419 Encounter for gynecological examination (general) (routine) without abnormal findings: Secondary | ICD-10-CM

## 2013-07-28 DIAGNOSIS — Z309 Encounter for contraceptive management, unspecified: Secondary | ICD-10-CM

## 2013-07-28 DIAGNOSIS — Z1151 Encounter for screening for human papillomavirus (HPV): Secondary | ICD-10-CM | POA: Insufficient documentation

## 2013-07-28 DIAGNOSIS — N390 Urinary tract infection, site not specified: Secondary | ICD-10-CM

## 2013-07-28 DIAGNOSIS — R87810 Cervical high risk human papillomavirus (HPV) DNA test positive: Secondary | ICD-10-CM | POA: Insufficient documentation

## 2013-07-28 DIAGNOSIS — Z113 Encounter for screening for infections with a predominantly sexual mode of transmission: Secondary | ICD-10-CM | POA: Insufficient documentation

## 2013-07-28 DIAGNOSIS — Z3049 Encounter for surveillance of other contraceptives: Secondary | ICD-10-CM

## 2013-07-28 DIAGNOSIS — I10 Essential (primary) hypertension: Secondary | ICD-10-CM

## 2013-07-28 DIAGNOSIS — Z124 Encounter for screening for malignant neoplasm of cervix: Secondary | ICD-10-CM | POA: Insufficient documentation

## 2013-07-28 MED ORDER — SULFAMETHOXAZOLE-TMP DS 800-160 MG PO TABS: 1.0000 | ORAL_TABLET | Freq: Two times a day (BID) | ORAL | Status: DC

## 2013-07-28 MED ORDER — AMLODIPINE BESYLATE 10 MG PO TABS: 10.0000 mg | ORAL_TABLET | Freq: Every day | ORAL | Status: DC

## 2013-07-28 MED ORDER — HYDROCHLOROTHIAZIDE 25 MG PO TABS: 25.0000 mg | ORAL_TABLET | Freq: Every day | ORAL | Status: DC

## 2013-07-28 NOTE — Progress Notes (Signed)
Patient ID: Angel Morris, female   DOB: 11/12/1975, 37 y.o.   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 She was upset before coming in by daughter who is having drama at school.She has noticed some low back pain and UTI symptoms, has history of kidney stones.  Current Medications, Allergies, Past Medical History, Past Surgical History, Family History and Social History were reviewed in [employer] record.     Review of Systems: Patient denies any headaches, blurred vision, shortness of breath, chest pain, abdominal pain, problems with bowel movements or intercourse. No joint pain or mood changes,has noticed spotting at times but OCs are good.See HPI for positives.    Physical Exam:BP 130/100  Pulse 78  Ht 5\' 4"  (1.626 m)  Wt 179 lb (81.194 kg)  BMI 30.71 kg/m2  LMP 11/08/2014urine dipstick +nitrates and blood and protein. General:  Well developed, well nourished, no acute distress Skin:  Warm and dry Neck:  Midline trachea, normal thyroid Lungs; Clear to auscultation bilaterally Breast:  No dominant palpable mass, retraction, or nipple discharge Cardiovascular: Regular rate and rhythm Abdomen:  Soft, non tender, no hepatosplenomegaly, no CVAT Pelvic:  External genitalia is normal in appearance.  The vagina is normal in appearance. The cervix is bulbous.Pap performed with HPV,GC/CHL.  Uterus is felt to be normal size, shape, and contour.  No     adnexal masses or tenderness noted. Extremities:  No swelling or varicosities noted Psych:  No mood changes,alert and cooperative   Impression: Yearly gyn exam Family planning medicaid Contraceptive management Hypertension UTI    Plan: Refilled norvasc and hydrodiuril x 1 year Rx septra ds 1 bid x 7 days push fluids Check HIV,RPR and HSV  2 Physical in 1 year Mammogram at 40 Continue OCs Check BP and call me with results

## 2013-07-28 NOTE — Patient Instructions (Signed)
Hypertension As your heart beats, it forces blood through your arteries. This force is your blood pressure. If the pressure is too high, it is called hypertension (HTN) or high blood pressure. HTN is dangerous because you may have it and not know it. High blood pressure may mean that your heart has to work harder to pump blood. Your arteries may be narrow or stiff. The extra work puts you at risk for heart disease, stroke, and other problems.  Blood pressure consists of two numbers, a higher number over a lower, 110/72, for example. It is stated as "110 over 72." The ideal is below 120 for the top number (systolic) and under 80 for the bottom (diastolic). Write down your blood pressure today. You should pay close attention to your blood pressure if you have certain conditions such as:  Heart failure.  Prior heart attack.  Diabetes  Chronic kidney disease.  Prior stroke.  Multiple risk factors for heart disease. To see if you have HTN, your blood pressure should be measured while you are seated with your arm held at the level of the heart. It should be measured at least twice. A one-time elevated blood pressure reading (especially in the Emergency Department) does not mean that you need treatment. There may be conditions in which the blood pressure is different between your right and left arms. It is important to see your caregiver soon for a recheck. Most people have essential hypertension which means that there is not a specific cause. This type of high blood pressure may be lowered by changing lifestyle factors such as:  Stress.  Smoking.  Lack of exercise.  Excessive weight.  Drug/tobacco/alcohol use.  Eating less salt. Most people do not have symptoms from high blood pressure until it has caused damage to the body. Effective treatment can often prevent, delay or reduce that damage. TREATMENT  When a cause has been identified, treatment for high blood pressure is directed at the  cause. There are a large number of medications to treat HTN. These fall into several categories, and your caregiver will help you select the medicines that are best for you. Medications may have side effects. You should review side effects with your caregiver. If your blood pressure stays high after you have made lifestyle changes or started on medicines,   Your medication(s) may need to be changed.  Other problems may need to be addressed.  Be certain you understand your prescriptions, and know how and when to take your medicine.  Be sure to follow up with your caregiver within the time frame advised (usually within two weeks) to have your blood pressure rechecked and to review your medications.  If you are taking more than one medicine to lower your blood pressure, make sure you know how and at what times they should be taken. Taking two medicines at the same time can result in blood pressure that is too low. SEEK IMMEDIATE MEDICAL CARE IF:  You develop a severe headache, blurred or changing vision, or confusion.  You have unusual weakness or numbness, or a faint feeling.  You have severe chest or abdominal pain, vomiting, or breathing problems. MAKE SURE YOU:   Understand these instructions.  Will watch your condition.  Will get help right away if you are not doing well or get worse. Document Released: 08/14/2005 Document Revised: 11/06/2011 Document Reviewed: 04/03/2008 [employer] Patient Information 2014 [employer]. Urinary Tract Infection Urinary tract infections (UTIs) can develop anywhere along your urinary tract. Your urinary   tract is your body's drainage system for removing wastes and extra water. Your urinary tract includes two kidneys, two ureters, a bladder, and a urethra. Your kidneys are a pair of bean-shaped organs. Each kidney is about the size of your fist. They are located below your ribs, one on each side of your spine. CAUSES Infections are caused by microbes,  which are microscopic organisms, including fungi, viruses, and bacteria. These organisms are so small that they can only be seen through a microscope. Bacteria are the microbes that most commonly cause UTIs. SYMPTOMS  Symptoms of UTIs may vary by age and gender of the patient and by the location of the infection. Symptoms in young women typically include a frequent and intense urge to urinate and a painful, burning feeling in the bladder or urethra during urination. Older women and men are more likely to be tired, shaky, and weak and have muscle aches and abdominal pain. A fever may mean the infection is in your kidneys. Other symptoms of a kidney infection include pain in your back or sides below the ribs, nausea, and vomiting. DIAGNOSIS To diagnose a UTI, your caregiver will ask you about your symptoms. Your caregiver also will ask to provide a urine sample. The urine sample will be tested for bacteria and white blood cells. White blood cells are made by your body to help fight infection. TREATMENT  Typically, UTIs can be treated with medication. Because most UTIs are caused by a bacterial infection, they usually can be treated with the use of antibiotics. The choice of antibiotic and length of treatment depend on your symptoms and the type of bacteria causing your infection. HOME CARE INSTRUCTIONS  If you were prescribed antibiotics, take them exactly as your caregiver instructs you. Finish the medication even if you feel better after you have only taken some of the medication.  Drink enough water and fluids to keep your urine clear or pale yellow.  Avoid caffeine, tea, and carbonated beverages. They tend to irritate your bladder.  Empty your bladder often. Avoid holding urine for long periods of time.  Empty your bladder before and after sexual intercourse.  After a bowel movement, women should cleanse from front to back. Use each tissue only once. SEEK MEDICAL CARE IF:   You have back  pain.  You develop a fever.  Your symptoms do not begin to resolve within 3 days. SEEK IMMEDIATE MEDICAL CARE IF:   You have severe back pain or lower abdominal pain.  You develop chills.  You have nausea or vomiting.  You have continued burning or discomfort with urination. MAKE SURE YOU:   Understand these instructions.  Will watch your condition.  Will get help right away if you are not doing well or get worse. Document Released: 05/24/2005 Document Revised: 02/13/2012 Document Reviewed: 09/22/2011 [facility] Patient Information 2014 [place], [place]. Continue meds  Increase water Check BP and call me with results Physical in 1 year Mammogram at 40

## 2013-07-29 ENCOUNTER — Telehealth: Payer: Self-pay | Admitting: Adult Health

## 2013-07-29 LAB — HSV 2 ANTIBODY, IGG: HSV 2 Glycoprotein G Ab, IgG: 8.7 IV — ABNORMAL HIGH

## 2013-07-29 LAB — HIV ANTIBODY (ROUTINE TESTING W REFLEX): HIV: NONREACTIVE

## 2013-07-29 NOTE — Telephone Encounter (Signed)
Pt aware of labs, HSV2 +

## 2013-07-30 ENCOUNTER — Telehealth: Payer: Self-pay | Admitting: Adult Health

## 2013-07-30 NOTE — Telephone Encounter (Signed)
Left message to call me about her pap 

## 2013-08-11 ENCOUNTER — Encounter: Payer: Medicaid Other | Admitting: Obstetrics & Gynecology

## 2013-08-26 ENCOUNTER — Encounter: Payer: Medicaid Other | Admitting: Obstetrics & Gynecology

## 2013-09-04 ENCOUNTER — Ambulatory Visit (INDEPENDENT_AMBULATORY_CARE_PROVIDER_SITE_OTHER): Payer: Medicaid Other | Admitting: Obstetrics & Gynecology

## 2013-09-04 ENCOUNTER — Other Ambulatory Visit: Payer: Self-pay | Admitting: Obstetrics & Gynecology

## 2013-09-04 ENCOUNTER — Encounter: Payer: Self-pay | Admitting: Obstetrics & Gynecology

## 2013-09-04 VITALS — BP 158/90 | Ht 64.0 in | Wt 179.0 lb

## 2013-09-04 DIAGNOSIS — R87810 Cervical high risk human papillomavirus (HPV) DNA test positive: Secondary | ICD-10-CM

## 2013-09-04 DIAGNOSIS — N871 Moderate cervical dysplasia: Secondary | ICD-10-CM

## 2013-09-04 DIAGNOSIS — D069 Carcinoma in situ of cervix, unspecified: Secondary | ICD-10-CM

## 2013-09-04 NOTE — Progress Notes (Signed)
Patient ID: Angel Morris, female   DOB: 1976/08/13, 38 y.o.   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 Overweight(278.02)   . History of recurrent UTIs   . Anemia, iron deficiency   . Onychomycosis   . Anxiety   . Proteinuria   . Hypertension   . Asthma   . Menorrhagia   . Nephrolithiasis   . Kidney stones   . Uterine fibroid   . Abnormal pap     lSIL   . Human papilloma virus   . Abnormal Pap smear   . Contraceptive management 07/28/2013    Past Surgical History  Procedure Laterality Date  . Rotary cuff - left shoulder  2005  . Kidney stones  june 2000  . Dilation and curettage of uterus      OB History   Grav Para Term Preterm Abortions TAB SAB Ect Mult Living   7 4   3  3   4       No Known Allergies  History   Social History  . Marital Status: Divorced    Spouse Name: N/A    Number of Children: N/A  . Years of Education: N/A   Social History Main Topics  . Smoking status: Never Smoker   . Smokeless tobacco: Never Used  . Alcohol Use: Yes     Comment: rare  . Drug Use: No  . Sexual Activity: Yes    Birth Control/ Protection: Pill   Other Topics Concern  . None   Social History Narrative  . None    Family History  Problem Relation Age of Onset  . Fibromyalgia Mother   . Arthritis Mother   . Diabetes Mother   . Hypertension Mother   . Stroke Other   . Diabetes Brother

## 2013-09-11 ENCOUNTER — Ambulatory Visit (INDEPENDENT_AMBULATORY_CARE_PROVIDER_SITE_OTHER): Payer: Medicaid Other | Admitting: Obstetrics & Gynecology

## 2013-09-11 ENCOUNTER — Encounter: Payer: Self-pay | Admitting: Obstetrics & Gynecology

## 2013-09-11 VITALS — BP 150/90 | Wt 182.0 lb

## 2013-09-11 DIAGNOSIS — R87810 Cervical high risk human papillomavirus (HPV) DNA test positive: Secondary | ICD-10-CM

## 2013-09-11 DIAGNOSIS — D069 Carcinoma in situ of cervix, unspecified: Secondary | ICD-10-CM

## 2013-09-11 DIAGNOSIS — N871 Moderate cervical dysplasia: Secondary | ICD-10-CM

## 2013-09-11 NOTE — Progress Notes (Signed)
Patient ID: Angel Morris, female   DOB: 1975-11-30, 38 y.o.   MRN: [id number] Biopsy is negative even though on colpo concerned about HSIL  Talked with patient and recommend proceeding with laser conization of the cervix  Will set up soon  Past Medical History  Diagnosis Date  . Overweight   . History of recurrent UTIs   . Anemia, iron deficiency   . Onychomycosis   . Anxiety   . Proteinuria   . Hypertension   . Asthma   . Menorrhagia   . Nephrolithiasis   . Kidney stones   . Uterine fibroid   . Abnormal pap     lSIL   . Human papilloma virus   . Abnormal Pap smear   . Contraceptive management 07/28/2013    Past Surgical History  Procedure Laterality Date  . Rotary cuff - left shoulder  2005  . Kidney stones  june 2000  . Dilation and curettage of uterus      OB History   Grav Para Term Preterm Abortions TAB SAB Ect Mult Living   7 4   3  3   4       No Known Allergies  History   Social History  . Marital Status: Divorced    Spouse Name: N/A    Number of Children: N/A  . Years of Education: N/A   Social History Main Topics  . Smoking status: Never Smoker   . Smokeless tobacco: Never Used  . Alcohol Use: Yes     Comment: rare  . Drug Use: No  . Sexual Activity: Yes    Birth Control/ Protection: Pill   Other Topics Concern  . None   Social History Narrative  . None    Family History  Problem Relation Age of Onset  . Fibromyalgia Mother   . Arthritis Mother   . Diabetes Mother   . Hypertension Mother   . Stroke Other   . Diabetes Brother

## 2013-09-29 ENCOUNTER — Encounter (HOSPITAL_COMMUNITY): Payer: Self-pay | Admitting: Pharmacy Technician

## 2013-10-01 NOTE — Patient Instructions (Signed)
Angel Morris  10/01/2013   Your procedure is scheduled on:  10/08/2013  Report to [facility] at  65  AM.  Call this number if you have problems the morning of surgery: [phone]   Remember:   Do not eat food or drink liquids after midnight.   Take these medicines the morning of surgery with A SIP OF WATER: amlodipine, hydrochlorathiazide   Do not wear jewelry, make-up or nail polish.  Do not wear lotions, powders, or perfumes.   Do not shave 48 hours prior to surgery. Men may shave face and neck.  Do not bring valuables to the hospital.  [facility] is not responsible for any belongings or valuables.               Contacts, dentures or bridgework may not be worn into surgery.  Leave suitcase in the car. After surgery it may be brought to your room.  For patients admitted to the hospital, discharge time is determined by your treatment team.               Patients discharged the day of surgery will not be allowed to drive home.  Name and phone number of your driver: family  Special Instructions: Shower using CHG 2 nights before surgery and the night before surgery.  If you shower the day of surgery use CHG.  Use special wash - you have one bottle of CHG for all showers.  You should use approximately 1/3 of the bottle for each shower.   Please read over the following fact sheets that you were given: Pain Booklet, Coughing and Deep Breathing, Surgical Site Infection Prevention, Anesthesia Post-op Instructions and Care and Recovery After Surgery Conization of the Cervix Cervical conization is the cutting (excision) of a cone-shaped portion of the cervix. The procedure is performed through the vagina in either your health care provider's office or an operating room. This procedure is usually done when there is abnormal bleeding from the cervix. It can also be done to evaluate an abnormal Pap test or if an abnormality is seen on the cervix during an exam. The tissue is then examined  to see if there are precancerous cells or cancer present.  Conization of the cervix is not done during a menstrual period or pregnancy.  LET [facility] CARE PROVIDER KNOW ABOUT:  Any allergies you have.   All medicines you are taking, including vitamins, herbs, eye drops, creams, and over-the-counter medicines.   Previous problems you or members of your family have had with the use of anesthetics.   Any blood disorders you have.   Previous surgeries you have had.   Medical conditions you have.   Your smoking habits.   The possibility of being pregnant.  RISKS AND COMPLICATIONS  Generally, conization of the cervix is a safe procedure. However, as with any procedure, complications can occur. Possible complications include:  Heavy bleeding several days or weeks after the procedure. Light bleeding or spotting after the procedure is normal.  Infection (rare).  Damage to the cervix or surrounding organs (uncommon).   Problems with the anesthesia.   Increased risk of preterm labor in future pregnancies. BEFORE THE PROCEDURE  Do not eat or drink anything for 6 8 hours before the procedure.   Do not take aspirin or blood thinners for at least a week before the procedure or as directed by your health care provider.   Arrange for someone to take you home after  the procedure.  PROCEDURE There are three different methods to perform conization of the cervix. These include:   The cold knife method In this method a small cone-shaped sample of tissue is cut out with a knife (scalpel) from the cervical canal and the transformation zone (where the normal cells end and the abnormal cells begin).   The LEEP method In this method a small cone-shaped sample of tissue is cut out with a thin wire that can burn (cauterize) the cervical tissue with an electrical current.   Laser treatment In this method a small cone-shaped sample of tissue is cut out and then cauterized with a laser  beam to prevent bleeding.  The procedure will be performed as follows:   Depending on the method, you will either be given a medicine to make you sleep (general anesthetic) or a numbing medicine (local anesthetic). A medicine that numbs the cervix (cervical block) may be given.   A lubricated device called a speculum will be inserted into the vagina to spread open the walls of the vagina. This will help your health care provider see the inside of the vagina and cervix better.   The tissue from the cervix will be removed and examined.   The results of the procedure will help your health care provider decide if further treatment is necessary. They will also help your health care provider decide on the best treatment if your results are abnormal. AFTER THE PROCEDURE  If you had a general anesthetic, you may be groggy for 2 3 hours after the procedure.   If you had a local anesthetic, you will rest at the clinic or hospital until you are stable and feel ready to go home.   Recovery may take up to 3 weeks.   You may have some cramping for about 1 week.   You may have bloody discharge or light bleeding for 1 2 weeks.   You may have black discharge coming from the vagina. This is from the paste used on the cervix to prevent bleeding. This is normal discharge.  Document Released: 05/24/2005 Document Revised: 04/16/2013 Document Reviewed: 02/07/2013 [facility] Patient Information 2014 [place]. PATIENT INSTRUCTIONS POST-ANESTHESIA  IMMEDIATELY FOLLOWING SURGERY:  Do not drive or operate machinery for the first twenty four hours after surgery.  Do not make any important decisions for twenty four hours after surgery or while taking narcotic pain medications or sedatives.  If you develop intractable nausea and vomiting or a severe headache please notify your doctor immediately.  FOLLOW-UP:  Please make an appointment with your surgeon as instructed. You do not need to follow up with  anesthesia unless specifically instructed to do so.  WOUND CARE INSTRUCTIONS (if applicable):  Keep a dry clean dressing on the anesthesia/puncture wound site if there is drainage.  Once the wound has quit draining you may leave it open to air.  Generally you should leave the bandage intact for twenty four hours unless there is drainage.  If the epidural site drains for more than 36-48 hours please call the anesthesia department.  QUESTIONS?:  Please feel free to call your physician or the hospital operator if you have any questions, and they will be happy to assist you.

## 2013-10-02 ENCOUNTER — Encounter (HOSPITAL_COMMUNITY)
Admission: RE | Admit: 2013-10-02 | Discharge: 2013-10-02 | Disposition: A | Discharge status: Home / Self Care | Payer: Medicaid Other | Source: Ambulatory Visit | Attending: Obstetrics & Gynecology | Admitting: Obstetrics & Gynecology

## 2013-10-03 ENCOUNTER — Encounter (HOSPITAL_COMMUNITY)
Admission: RE | Admit: 2013-10-03 | Discharge: 2013-10-03 | Disposition: A | Discharge status: Home / Self Care | Payer: Medicaid Other | Source: Ambulatory Visit | Attending: Obstetrics & Gynecology | Admitting: Obstetrics & Gynecology

## 2013-10-03 ENCOUNTER — Encounter (HOSPITAL_COMMUNITY): Payer: Self-pay | Admitting: Pharmacy Technician

## 2013-10-03 ENCOUNTER — Encounter (HOSPITAL_COMMUNITY): Payer: Self-pay

## 2013-10-03 DIAGNOSIS — Z01812 Encounter for preprocedural laboratory examination: Secondary | ICD-10-CM | POA: Insufficient documentation

## 2013-10-03 LAB — CBC WITH DIFFERENTIAL/PLATELET
BASOS ABS: 0 10*3/uL (ref 0.0–0.1)
BASOS PCT: 0 % (ref 0–1)
Eosinophils Absolute: 0.1 10*3/uL (ref 0.0–0.7)
Eosinophils Relative: 1 % (ref 0–5)
HCT: 36.1 % (ref 36.0–46.0)
HEMOGLOBIN: 12.9 g/dL (ref 12.0–15.0)
Lymphocytes Relative: 30 % (ref 12–46)
Lymphs Abs: 2.6 10*3/uL (ref 0.7–4.0)
MCH: 28.3 pg (ref 26.0–34.0)
MCHC: 35.7 g/dL (ref 30.0–36.0)
MCV: 79.2 fL (ref 78.0–100.0)
Monocytes Absolute: 0.6 10*3/uL (ref 0.1–1.0)
Monocytes Relative: 7 % (ref 3–12)
NEUTROS ABS: 5.4 10*3/uL (ref 1.7–7.7)
NEUTROS PCT: 62 % (ref 43–77)
Platelets: 228 10*3/uL (ref 150–400)
RBC: 4.56 MIL/uL (ref 3.87–5.11)
RDW: 13.4 % (ref 11.5–15.5)
WBC: 8.8 10*3/uL (ref 4.0–10.5)

## 2013-10-03 LAB — URINALYSIS, ROUTINE W REFLEX MICROSCOPIC
Bilirubin Urine: NEGATIVE
GLUCOSE, UA: NEGATIVE mg/dL
KETONES UR: NEGATIVE mg/dL
Leukocytes, UA: NEGATIVE
Nitrite: NEGATIVE
PROTEIN: NEGATIVE mg/dL
Specific Gravity, Urine: 1.015 (ref 1.005–1.030)
Urobilinogen, UA: 0.2 mg/dL (ref 0.0–1.0)
pH: 7 (ref 5.0–8.0)

## 2013-10-03 LAB — COMPREHENSIVE METABOLIC PANEL
ALBUMIN: 3.3 g/dL — AB (ref 3.5–5.2)
ALT: 14 U/L (ref 0–35)
AST: 20 U/L (ref 0–37)
Alkaline Phosphatase: 61 U/L (ref 39–117)
BILIRUBIN TOTAL: 0.4 mg/dL (ref 0.3–1.2)
BUN: 11 mg/dL (ref 6–23)
CHLORIDE: 102 meq/L (ref 96–112)
CO2: 24 mEq/L (ref 19–32)
Calcium: 9.1 mg/dL (ref 8.4–10.5)
Creatinine, Ser: 0.78 mg/dL (ref 0.50–1.10)
GFR calc Af Amer: >90 mL/min (ref >90)
GFR calc non Af Amer: >90 mL/min (ref >90)
Glucose, Bld: 119 mg/dL — ABNORMAL HIGH (ref 70–99)
Potassium: 3.5 mEq/L — ABNORMAL LOW (ref 3.7–5.3)
Sodium: 140 mEq/L (ref 137–147)
Total Protein: 7.1 g/dL (ref 6.0–8.3)

## 2013-10-03 LAB — URINE MICROSCOPIC-ADD ON

## 2013-10-03 LAB — HCG, SERUM, QUALITATIVE: Preg, Serum: NEGATIVE

## 2013-10-05 LAB — URINE CULTURE
COLONY COUNT: NO GROWTH
Culture: NO GROWTH

## 2013-10-08 ENCOUNTER — Ambulatory Visit (HOSPITAL_COMMUNITY)
Admission: RE | Admit: 2013-10-08 | Discharge: 2013-10-08 | Disposition: A | Discharge status: Home / Self Care | Payer: Medicaid Other | Source: Ambulatory Visit | Attending: Obstetrics & Gynecology | Admitting: Obstetrics & Gynecology

## 2013-10-08 ENCOUNTER — Encounter (HOSPITAL_COMMUNITY): Payer: Self-pay | Admitting: *Deleted

## 2013-10-08 ENCOUNTER — Encounter (HOSPITAL_COMMUNITY)
Admission: RE | Disposition: A | Discharge status: Home / Self Care | Payer: Self-pay | Source: Ambulatory Visit | Attending: Obstetrics & Gynecology

## 2013-10-08 ENCOUNTER — Ambulatory Visit (HOSPITAL_COMMUNITY): Payer: Medicaid Other | Admitting: Anesthesiology

## 2013-10-08 ENCOUNTER — Encounter (HOSPITAL_COMMUNITY): Payer: Medicaid Other | Admitting: Anesthesiology

## 2013-10-08 DIAGNOSIS — D069 Carcinoma in situ of cervix, unspecified: Secondary | ICD-10-CM

## 2013-10-08 DIAGNOSIS — Z6832 Body mass index (BMI) 32.0-32.9, adult: Secondary | ICD-10-CM | POA: Insufficient documentation

## 2013-10-08 DIAGNOSIS — R87810 Cervical high risk human papillomavirus (HPV) DNA test positive: Secondary | ICD-10-CM

## 2013-10-08 DIAGNOSIS — I10 Essential (primary) hypertension: Secondary | ICD-10-CM | POA: Insufficient documentation

## 2013-10-08 DIAGNOSIS — N871 Moderate cervical dysplasia: Secondary | ICD-10-CM

## 2013-10-08 HISTORY — PX: HOLMIUM LASER APPLICATION: SHX5852

## 2013-10-08 HISTORY — PX: CERVICAL CONIZATION W/BX: SHX1330

## 2013-10-08 SURGERY — CONE BIOPSY, CERVIX
Anesthesia: General | Site: Cervix

## 2013-10-08 MED ORDER — FENTANYL CITRATE 0.05 MG/ML IJ SOLN
INTRAMUSCULAR | Status: AC
  Filled 2013-10-08: qty 2

## 2013-10-08 MED ORDER — WATER FOR IRRIGATION, STERILE IR SOLN
Status: DC | PRN
  Administered 2013-10-08: 1000 mL

## 2013-10-08 MED ORDER — ACETIC ACID 5 % SOLN
Status: DC | PRN
  Administered 2013-10-08: 1 via TOPICAL

## 2013-10-08 MED ORDER — FENTANYL CITRATE 0.05 MG/ML IJ SOLN
INTRAMUSCULAR | Status: DC | PRN
  Administered 2013-10-08 (×4): 25 ug via INTRAVENOUS
  Administered 2013-10-08 (×2): 50 ug via INTRAVENOUS

## 2013-10-08 MED ORDER — FERRIC SUBSULFATE 259 MG/GM EX SOLN
CUTANEOUS | Status: AC
  Filled 2013-10-08: qty 8

## 2013-10-08 MED ORDER — PROPOFOL 10 MG/ML IV BOLUS
INTRAVENOUS | Status: AC
  Filled 2013-10-08: qty 20

## 2013-10-08 MED ORDER — MIDAZOLAM HCL 2 MG/2ML IJ SOLN
INTRAMUSCULAR | Status: AC
  Filled 2013-10-08: qty 2

## 2013-10-08 MED ORDER — PROPOFOL 10 MG/ML IV BOLUS
INTRAVENOUS | Status: DC | PRN
  Administered 2013-10-08: 150 mg via INTRAVENOUS

## 2013-10-08 MED ORDER — FENTANYL CITRATE 0.05 MG/ML IJ SOLN
25.0000 ug | INTRAMUSCULAR | Status: AC
  Administered 2013-10-08 (×2): 25 ug via INTRAVENOUS

## 2013-10-08 MED ORDER — LACTATED RINGERS IV SOLN
INTRAVENOUS | Status: DC
  Administered 2013-10-08: 1000 mL via INTRAVENOUS

## 2013-10-08 MED ORDER — MIDAZOLAM HCL 2 MG/2ML IJ SOLN
1.0000 mg | INTRAMUSCULAR | Status: DC | PRN
  Administered 2013-10-08: 2 mg via INTRAVENOUS

## 2013-10-08 MED ORDER — CEFAZOLIN SODIUM-DEXTROSE 2-3 GM-% IV SOLR
2.0000 g | Freq: Once | INTRAVENOUS | Status: AC
  Administered 2013-10-08: 2 g via INTRAVENOUS

## 2013-10-08 MED ORDER — ONDANSETRON HCL 4 MG/2ML IJ SOLN: 4.0000 mg | Freq: Once | INTRAMUSCULAR | Status: DC | PRN

## 2013-10-08 MED ORDER — FERRIC SUBSULFATE SOLN
Status: DC | PRN
  Administered 2013-10-08: 2

## 2013-10-08 MED ORDER — MIDAZOLAM HCL 5 MG/5ML IJ SOLN
INTRAMUSCULAR | Status: DC | PRN
  Administered 2013-10-08: 2 mg via INTRAVENOUS

## 2013-10-08 MED ORDER — LIDOCAINE HCL 1 % IJ SOLN
INTRAMUSCULAR | Status: DC | PRN
  Administered 2013-10-08: 50 mg via INTRADERMAL

## 2013-10-08 MED ORDER — GLYCOPYRROLATE 0.2 MG/ML IJ SOLN
INTRAMUSCULAR | Status: AC
  Filled 2013-10-08: qty 1

## 2013-10-08 MED ORDER — KETOROLAC TROMETHAMINE 30 MG/ML IJ SOLN
INTRAMUSCULAR | Status: AC
  Filled 2013-10-08: qty 1

## 2013-10-08 MED ORDER — FENTANYL CITRATE 0.05 MG/ML IJ SOLN
25.0000 ug | INTRAMUSCULAR | Status: DC | PRN
  Administered 2013-10-08 (×2): 50 ug via INTRAVENOUS

## 2013-10-08 MED ORDER — HYDROCODONE-ACETAMINOPHEN 5-325 MG PO TABS: 1.0000 | ORAL_TABLET | Freq: Four times a day (QID) | ORAL | Status: DC | PRN

## 2013-10-08 MED ORDER — KETOROLAC TROMETHAMINE 30 MG/ML IJ SOLN
30.0000 mg | Freq: Once | INTRAMUSCULAR | Status: AC
  Administered 2013-10-08: 30 mg via INTRAVENOUS

## 2013-10-08 MED ORDER — GLYCOPYRROLATE 0.2 MG/ML IJ SOLN
0.2000 mg | Freq: Once | INTRAMUSCULAR | Status: AC
  Administered 2013-10-08: 0.2 mg via INTRAVENOUS

## 2013-10-08 MED ORDER — KETOROLAC TROMETHAMINE 10 MG PO TABS: 10.0000 mg | ORAL_TABLET | Freq: Three times a day (TID) | ORAL | Status: DC | PRN

## 2013-10-08 MED ORDER — ONDANSETRON HCL 8 MG PO TABS: 8.0000 mg | ORAL_TABLET | Freq: Three times a day (TID) | ORAL | Status: DC | PRN

## 2013-10-08 MED ORDER — CEFAZOLIN SODIUM-DEXTROSE 2-3 GM-% IV SOLR
INTRAVENOUS | Status: AC
Start: 2013-10-08 — End: 2013-10-08
  Filled 2013-10-08: qty 50

## 2013-10-08 SURGICAL SUPPLY — 32 items
APPLICATOR COTTON TIP 6IN STRL (MISCELLANEOUS) ×2 IMPLANT
BAG HAMPER (MISCELLANEOUS) ×2 IMPLANT
CLOTH BEACON ORANGE TIMEOUT ST (SAFETY) ×2 IMPLANT
COAGULATOR SUCT SWTCH 10FR 6 (ELECTROSURGICAL) ×1 IMPLANT
COVER LIGHT HANDLE STERIS (MISCELLANEOUS) ×4 IMPLANT
ELECT REM PT RETURN 9FT ADLT (ELECTROSURGICAL) ×2
ELECTRODE REM PT RTRN 9FT ADLT (ELECTROSURGICAL) ×1 IMPLANT
FORMALIN 10 PREFIL 120ML (MISCELLANEOUS) ×2 IMPLANT
GAUZE SPONGE 4X4 16PLY XRAY LF (GAUZE/BANDAGES/DRESSINGS) ×1 IMPLANT
GLOVE BIOGEL PI IND STRL 7.0 (GLOVE) IMPLANT
GLOVE BIOGEL PI IND STRL 8 (GLOVE) ×1 IMPLANT
GLOVE BIOGEL PI INDICATOR 7.0 (GLOVE) ×1
GLOVE BIOGEL PI INDICATOR 8 (GLOVE) ×1
GLOVE ECLIPSE 7.0 STRL STRAW (GLOVE) ×1 IMPLANT
GLOVE ECLIPSE 8.0 STRL XLNG CF (GLOVE) ×2 IMPLANT
GOWN STRL REUS W/TWL LRG LVL3 (GOWN DISPOSABLE) ×3 IMPLANT
GOWN STRL REUS W/TWL XL LVL3 (GOWN DISPOSABLE) ×1 IMPLANT
KIT ROOM TURNOVER APOR (KITS) ×2 IMPLANT
LASER FIBER DISP 1000U (UROLOGICAL SUPPLIES) ×2 IMPLANT
MANIFOLD NEPTUNE II (INSTRUMENTS) ×2 IMPLANT
NS IRRIG 1000ML POUR BTL (IV SOLUTION) ×2 IMPLANT
PACK BASIC III (CUSTOM PROCEDURE TRAY) ×2
PACK PERI GYN (CUSTOM PROCEDURE TRAY) ×2 IMPLANT
PACK SRG BSC III STRL LF ECLPS (CUSTOM PROCEDURE TRAY) ×1 IMPLANT
PAD ARMBOARD 7.5X6 YLW CONV (MISCELLANEOUS) ×2 IMPLANT
PREFILTER SMOKE EVAC (FILTER) ×2 IMPLANT
SET BASIN LINEN APH (SET/KITS/TRAYS/PACK) ×2 IMPLANT
SUT VIC AB 0 CT1 27 (SUTURE)
SUT VIC AB 0 CT1 27XCR 8 STRN (SUTURE) IMPLANT
TOWEL OR 17X26 4PK STRL BLUE (TOWEL DISPOSABLE) ×2 IMPLANT
TUBING SMOKE EVAC CO2 (TUBING) ×2 IMPLANT
WATER STERILE IRR 1000ML POUR (IV SOLUTION) ×2 IMPLANT

## 2013-10-08 NOTE — Discharge Instructions (Signed)
Conization of the Cervix Cervical conization is the cutting (excision) of a cone-shaped portion of the cervix. The procedure is performed through the vagina in either your health care provider's office or an operating room. This procedure is usually done when there is abnormal bleeding from the cervix. It can also be done to evaluate an abnormal Pap test or if an abnormality is seen on the cervix during an exam. The tissue is then examined to see if there are precancerous cells or cancer present.  Conization of the cervix is not done during a menstrual period or pregnancy.  LET YOUR HEALTH CARE PROVIDER KNOW ABOUT:  Any allergies you have.   All medicines you are taking, including vitamins, herbs, eye drops, creams, and over-the-counter medicines.   Previous problems you or members of your family have had with the use of anesthetics.   Any blood disorders you have.   Previous surgeries you have had.   Medical conditions you have.   Your smoking habits.   The possibility of being pregnant.  RISKS AND COMPLICATIONS  Generally, conization of the cervix is a safe procedure. However, as with any procedure, complications can occur. Possible complications include:  Heavy bleeding several days or weeks after the procedure. Light bleeding or spotting after the procedure is normal.  Infection (rare).  Damage to the cervix or surrounding organs (uncommon).   Problems with the anesthesia.   Increased risk of preterm labor in future pregnancies. BEFORE THE PROCEDURE  Do not eat or drink anything for 6 8 hours before the procedure.   Do not take aspirin or blood thinners for at least a week before the procedure or as directed by your health care provider.   Arrange for someone to take you home after the procedure.  PROCEDURE There are three different methods to perform conization of the cervix. These include:   The cold knife method In this method a small cone-shaped sample  of tissue is cut out with a knife (scalpel) from the cervical canal and the transformation zone (where the normal cells end and the abnormal cells begin).   The LEEP method In this method a small cone-shaped sample of tissue is cut out with a thin wire that can burn (cauterize) the cervical tissue with an electrical current.   Laser treatment In this method a small cone-shaped sample of tissue is cut out and then cauterized with a laser beam to prevent bleeding.  The procedure will be performed as follows:   Depending on the method, you will either be given a medicine to make you sleep (general anesthetic) or a numbing medicine (local anesthetic). A medicine that numbs the cervix (cervical block) may be given.   A lubricated device called a speculum will be inserted into the vagina to spread open the walls of the vagina. This will help your health care provider see the inside of the vagina and cervix better.   The tissue from the cervix will be removed and examined.   The results of the procedure will help your health care provider decide if further treatment is necessary. They will also help your health care provider decide on the best treatment if your results are abnormal. AFTER THE PROCEDURE  If you had a general anesthetic, you may be groggy for 2 3 hours after the procedure.   If you had a local anesthetic, you will rest at the clinic or hospital until you are stable and feel ready to go home.   Recovery may   take up to 3 weeks.   You may have some cramping for about 1 week.   You may have bloody discharge or light bleeding for 1 2 weeks.   You may have black discharge coming from the vagina. This is from the paste used on the cervix to prevent bleeding. This is normal discharge.  Document Released: 05/24/2005 Document Revised: 04/16/2013 Document Reviewed: 02/07/2013 [employer] Patient Information 2014 [employer].  

## 2013-10-08 NOTE — Transfer of Care (Signed)
Immediate Anesthesia Transfer of Care Note  Patient: Angel Morris  Procedure(s) Performed: Procedure(s): CONIZATION CERVIX WITH BIOPSY (N/A) HOLMIUM LASER APPLICATION (N/A)  Patient Location: PACU  Anesthesia Type:General  Level of Consciousness: awake and patient cooperative  Airway & Oxygen Therapy: Patient Spontanous Breathing and Patient connected to face mask oxygen  Post-op Assessment: Report given to PACU RN, Post -op Vital signs reviewed and stable and Patient moving all extremities  Post vital signs: Reviewed and stable  Complications: No apparent anesthesia complications

## 2013-10-08 NOTE — H&P (Signed)
Preoperative History and Physical  Angel Morris is a 38 y.o. [id number] with Patient's last menstrual period was 09/29/2013. admitted for a laser conization of the cervix.  Most recent Pap was HSIL with + HR HPV and colposcopy revealed dense acetowhite changes but the pathology was benign.  Therefore for diagnostic and therapeutic reason I am proceeding with laser conization of the cervix  PMH:    Past Medical History  Diagnosis Date  . Overweight   . History of recurrent UTIs   . Anemia, iron deficiency   . Onychomycosis   . Anxiety   . Proteinuria   . Hypertension   . Asthma   . Menorrhagia   . Nephrolithiasis   . Kidney stones   . Uterine fibroid   . Abnormal pap     lSIL   . Human papilloma virus   . Abnormal Pap smear   . Contraceptive management 07/28/2013  . Headache(784.0)     PSH:     Past Surgical History  Procedure Laterality Date  . Rotary cuff - left shoulder  2005  . Kidney stones  june 2000  . Dilation and curettage of uterus      POb/GynH:      OB History   Grav Para Term Preterm Abortions TAB SAB Ect Mult Living   7 4   3  3   4       SH:   History  Substance Use Topics  . Smoking status: Never Smoker   . Smokeless tobacco: Never Used  . Alcohol Use: Yes     Comment: rare    FH:    Family History  Problem Relation Age of Onset  . Fibromyalgia Mother   . Arthritis Mother   . Diabetes Mother   . Hypertension Mother   . Stroke Other   . Diabetes Brother      Allergies: No Known Allergies  Medications:      Current facility-administered medications:ceFAZolin (ANCEF) IVPB 2 g/50 mL premix, 2 g, Intravenous, Once, Florian Buff, MD;  fentaNYL (SUBLIMAZE) injection 25 mcg, 25 mcg, Intravenous, Q10 min, Lerry Liner, MD, 25 mcg at 10/08/13 0944;  lactated ringers infusion, , Intravenous, Continuous, Lerry Liner, MD, Last Rate: 75 mL/hr at 10/08/13 0943, 1,000 mL at 10/08/13 0943 midazolam (VERSED) injection 1-2 mg, 1-2 mg, Intravenous,  Q5 Min x 3 PRN, Lerry Liner, MD, 2 mg at 10/08/13 0973  Review of Systems:   Review of Systems  Constitutional: Negative for fever, chills, weight loss, malaise/fatigue and diaphoresis.  HENT: Negative for hearing loss, ear pain, nosebleeds, congestion, sore throat, neck pain, tinnitus and ear discharge.   Eyes: Negative for blurred vision, double vision, photophobia, pain, discharge and redness.  Respiratory: Negative for cough, hemoptysis, sputum production, shortness of breath, wheezing and stridor.   Cardiovascular: Negative for chest pain, palpitations, orthopnea, claudication, leg swelling and PND.  Gastrointestinal: Positive for abdominal pain. Negative for heartburn, nausea, vomiting, diarrhea, constipation, blood in stool and melena.  Genitourinary: Negative for dysuria, urgency, frequency, hematuria and flank pain.  Musculoskeletal: Negative for myalgias, back pain, joint pain and falls.  Skin: Negative for itching and rash.  Neurological: Negative for dizziness, tingling, tremors, sensory change, speech change, focal weakness, seizures, loss of consciousness, weakness and headaches.  Endo/Heme/Allergies: Negative for environmental allergies and polydipsia. Does not bruise/bleed easily.  Psychiatric/Behavioral: Negative for depression, suicidal ideas, hallucinations, memory loss and substance abuse. The patient is not nervous/anxious and does not have insomnia.  PHYSICAL EXAM:  Blood pressure 154/94, pulse 62, temperature 98.6 F (37 C), temperature source Oral, resp. rate 20, height 5' 1.5" (1.562 m), weight 177 lb (80.287 kg), last menstrual period 09/29/2013, SpO2 100.00%.    Vitals reviewed. Constitutional: She is oriented to person, place, and time. She appears well-developed and well-nourished.  HENT:  Head: Normocephalic and atraumatic.  Right Ear: External ear normal.  Left Ear: External ear normal.  Nose: Nose normal.  Mouth/Throat: Oropharynx is clear and  moist.  Eyes: Conjunctivae and EOM are normal. Pupils are equal, round, and reactive to light. Right eye exhibits no discharge. Left eye exhibits no discharge. No scleral icterus.  Neck: Normal range of motion. Neck supple. No tracheal deviation present. No thyromegaly present.  Cardiovascular: Normal rate, regular rhythm, normal heart sounds and intact distal pulses.  Exam reveals no gallop and no friction rub.   No murmur heard. Respiratory: Effort normal and breath sounds normal. No respiratory distress. She has no wheezes. She has no rales. She exhibits no tenderness.  GI: Soft. Bowel sounds are normal. She exhibits no distension and no mass. There is tenderness. There is no rebound and no guarding.  Genitourinary:       Vulva is normal without lesions Vagina is pink moist without discharge Cervix normal in appearance  Uterus is normal Adnexa is negative   Musculoskeletal: Normal range of motion. She exhibits no edema and no tenderness.  Neurological: She is alert and oriented to person, place, and time. She has normal reflexes. She displays normal reflexes. No cranial nerve deficit. She exhibits normal muscle tone. Coordination normal.  Skin: Skin is warm and dry. No rash noted. No erythema. No pallor.  Psychiatric: She has a normal mood and affect. Her behavior is normal. Judgment and thought content normal.    Labs: Results for orders placed during the hospital encounter of 10/03/13 (from the past 336 hour(s))  CBC WITH DIFFERENTIAL   Collection Time    10/03/13  3:30 PM      Result Value Ref Range   WBC 8.8  4.0 - 10.5 K/uL   RBC 4.56  3.87 - 5.11 MIL/uL   Hemoglobin 12.9  12.0 - 15.0 g/dL   HCT 03.5  46.5 - 68.1 %   MCV 79.2  78.0 - 100.0 fL   MCH 28.3  26.0 - 34.0 pg   MCHC 35.7  30.0 - 36.0 g/dL   RDW 27.5  17.0 - 01.7 %   Platelets 228  150 - 400 K/uL   Neutrophils Relative % 62  43 - 77 %   Neutro Abs 5.4  1.7 - 7.7 K/uL   Lymphocytes Relative 30  12 - 46 %   Lymphs  Abs 2.6  0.7 - 4.0 K/uL   Monocytes Relative 7  3 - 12 %   Monocytes Absolute 0.6  0.1 - 1.0 K/uL   Eosinophils Relative 1  0 - 5 %   Eosinophils Absolute 0.1  0.0 - 0.7 K/uL   Basophils Relative 0  0 - 1 %   Basophils Absolute 0.0  0.0 - 0.1 K/uL  COMPREHENSIVE METABOLIC PANEL   Collection Time    10/03/13  3:30 PM      Result Value Ref Range   Sodium 140  137 - 147 mEq/L   Potassium 3.5 (*) 3.7 - 5.3 mEq/L   Chloride 102  96 - 112 mEq/L   CO2 24  19 - 32 mEq/L   Glucose, Bld 119 (*)  70 - 99 mg/dL   BUN 11  6 - 23 mg/dL   Creatinine, Ser 0.78  0.50 - 1.10 mg/dL   Calcium 9.1  8.4 - 10.5 mg/dL   Total Protein 7.1  6.0 - 8.3 g/dL   Albumin 3.3 (*) 3.5 - 5.2 g/dL   AST 20  0 - 37 U/L   ALT 14  0 - 35 U/L   Alkaline Phosphatase 61  39 - 117 U/L   Total Bilirubin 0.4  0.3 - 1.2 mg/dL   GFR calc non Af Amer >90  >90 mL/min   GFR calc Af Amer >90  >90 mL/min  HCG, SERUM, QUALITATIVE   Collection Time    10/03/13  3:30 PM      Result Value Ref Range   Preg, Serum NEGATIVE  NEGATIVE  URINE CULTURE   Collection Time    10/03/13  3:54 PM      Result Value Ref Range   Specimen Description URINE, CLEAN CATCH     Special Requests NONE     Culture  Setup Time       Value: 10/04/2013 22:05     Performed at [place]       Value: NO GROWTH     Performed at [facility]   Culture       Value: NO GROWTH     Performed at [facility]   Report Status 10/05/2013 FINAL    URINALYSIS, ROUTINE W REFLEX MICROSCOPIC   Collection Time    10/03/13  3:54 PM      Result Value Ref Range   Color, Urine YELLOW  YELLOW   APPearance CLEAR  CLEAR   Specific Gravity, Urine 1.015  1.005 - 1.030   pH 7.0  5.0 - 8.0   Glucose, UA NEGATIVE  NEGATIVE mg/dL   Hgb urine dipstick TRACE (*) NEGATIVE   Bilirubin Urine NEGATIVE  NEGATIVE   Ketones, ur NEGATIVE  NEGATIVE mg/dL   Protein, ur NEGATIVE  NEGATIVE mg/dL   Urobilinogen, UA 0.2  0.0 - 1.0 mg/dL    Nitrite NEGATIVE  NEGATIVE   Leukocytes, UA NEGATIVE  NEGATIVE  URINE MICROSCOPIC-ADD ON   Collection Time    10/03/13  3:54 PM      Result Value Ref Range   Squamous Epithelial / LPF FEW (*) RARE   WBC, UA 3-6  <3 WBC/hpf   RBC / HPF 0-2  <3 RBC/hpf   Bacteria, UA FEW (*) RARE   Urine-Other AMORPHOUS URATES/PHOSPHATES      EKG: Orders placed during the hospital encounter of 05/07/13  . ED EKG  . ED EKG  . EKG 12-LEAD  . EKG 12-LEAD  . EKG    Imaging Studies: No results found.    Assessment: Patient Active Problem List   Diagnosis Date Noted  . 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 Contraceptive management 07/28/2013  . HYPOKALEMIA 04/28/2010  . ARTHRALGIA 04/28/2010  . PERIPHERAL EDEMA 04/28/2010  . OVERWEIGHT 01/24/2008  . UTI'S, RECURRENT 01/24/2008  . ANEMIA, IRON DEFICIENCY 02/15/2007  . ONYCHOMYCOSIS, TOENAILS 01/14/2007  . ANXIETY STATE NOS 01/14/2007  . HYPERTENSION, BENIGN ESSENTIAL 10/30/2006  . ASTHMA 10/30/2006  . PROTEINURIA 10/30/2006  . NEPHROLITHIASIS, HX OF 10/30/2006    Plan: HSIL of cervix for laser for diagnostic and therapeutic indications  Suellyn Meenan H 10/08/2013 9:50 AM

## 2013-10-08 NOTE — Anesthesia Preprocedure Evaluation (Addendum)
Anesthesia Evaluation  Patient identified by MRN, date of birth, ID band Patient awake    Reviewed: Allergy & Precautions, H&P , NPO status , Patient's Chart, lab work & pertinent test results  Airway Mallampati: I TM Distance: >3 FB     Dental  (+) Teeth Intact   Pulmonary asthma ,  breath sounds clear to auscultation        Cardiovascular hypertension, Pt. on medications Rhythm:Regular Rate:Normal     Neuro/Psych  Headaches, Anxiety    GI/Hepatic negative GI ROS,   Endo/Other    Renal/GU Renal disease     Musculoskeletal   Abdominal   Peds  Hematology  (+) anemia ,   Anesthesia Other Findings   Reproductive/Obstetrics                          Anesthesia Physical Anesthesia Plan  ASA: II  Anesthesia Plan: General   Post-op Pain Management:    Induction: Intravenous  Airway Management Planned: LMA  Additional Equipment:   Intra-op Plan:   Post-operative Plan: Extubation in OR  Informed Consent: I have reviewed the patients History and Physical, chart, labs and discussed the procedure including the risks, benefits and alternatives for the proposed anesthesia with the patient or authorized representative who has indicated his/her understanding and acceptance.     Plan Discussed with:   Anesthesia Plan Comments:         Anesthesia Quick Evaluation

## 2013-10-08 NOTE — Op Note (Signed)
Preoperative diagnosis:  1.  High Grade Squamous Intraepithelial lesion                                        2.  Large lesion  Postoperative Diagnosis:  Same as above  Procedure:  Cervical conization using laser,  ablation of cervical bed using laser  Surgeon:  Jacelyn Grip MD  Anesthesia:  Laryngeal mask airway  Findings:  Patient had never had an abnormal Pap smear before.  However her last Pap returned as atypical squamous cells cannot rule out high-grade dysplasia lesion.  I performed a colposcopy in the office and found a very large cervical lesion.  A biopsy returned revealing high-grade dysplasia with involvement of the underlying endocervical glands.  As a result we decided to proceed with a conization of the cervix for both therapeutic and diagnostic reasons.  Also the lesion was quite large and extensive.  Today at the time of surgery a repeat colposcopy was performed using 3% acetic acid and the lesion was once again confirmed.  There  were no new findings today.  Description of operation:  Patient was taken to the operating room and placed in the supine position where she underwent laryngeal mask airway anesthesia.  She was then placed in the high lithotomy position using candy cane stirrups.  She was then draped out for a laser procedure.  The microscope was used and 3% acetic acid was placed on the cervix.  The holmium laser was then employed at a power of 2.5 and rates between 8 and 15.  I achieved a couple millimeter margin around lesions both at 12:00 and 6:00 the laser was used to perform a conization.  The specimen was removed and sent to pathology for evaluation.  As is always the case with laser I did achieve at an appropriate margin around the disease with shrinkage of the tissue during the procedure it may appear to be a positive lateral margin.  However the surgical margin is indeed clear.  I then used the laser to ablate the conization bed to a depth of 5-7 mm laterally  coning down to 9 mm centrally and began getting good surgical margin.  Additional hemostasis was achieved using Monsel solution.  In the conization bed was completely hemostatic.  Blood loss for the procedure was none.  The patient received 2 grams of ancef preoperatively.  The patient was awakened from anesthesia and taken to the recovery room in good stable condition with all counts being correct.  She will be followed up in the office in one month for evaluation of the conization bed.  Kemara Quigley H 10/08/2013 11:17 AM

## 2013-10-08 NOTE — Anesthesia Postprocedure Evaluation (Signed)
  Anesthesia Post-op Note  Patient: Angel Morris  Procedure(s) Performed: Procedure(s): CONIZATION CERVIX WITH BIOPSY (N/A) HOLMIUM LASER APPLICATION (N/A)  Patient Location: PACU  Anesthesia Type:General  Level of Consciousness: awake, alert , oriented and patient cooperative  Airway and Oxygen Therapy: Patient Spontanous Breathing  Post-op Pain: 2 /10, mild  Post-op Assessment: Post-op Vital signs reviewed, Patient's Cardiovascular Status Stable, Respiratory Function Stable, Patent Airway, No signs of Nausea or vomiting and Pain level controlled  Post-op Vital Signs: Reviewed and stable  Complications: No apparent anesthesia complications

## 2013-10-08 NOTE — Anesthesia Procedure Notes (Signed)
Procedure Name: LMA Insertion Date/Time: 10/08/2013 10:23 AM Performed by: Charmaine Downs Pre-anesthesia Checklist: Patient being monitored, Suction available, Emergency Drugs available and Patient identified Patient Re-evaluated:Patient Re-evaluated prior to inductionOxygen Delivery Method: Circle system utilized Preoxygenation: Pre-oxygenation with 100% oxygen Intubation Type: IV induction Ventilation: Mask ventilation without difficulty LMA: LMA inserted LMA Size: 3.0 Tube type: Oral Number of attempts: 1 Placement Confirmation: breath sounds checked- equal and bilateral and positive ETCO2 Tube secured with: Tape Dental Injury: Teeth and Oropharynx as per pre-operative assessment

## 2013-10-09 ENCOUNTER — Encounter (HOSPITAL_COMMUNITY): Payer: Self-pay | Admitting: Obstetrics & Gynecology

## 2013-10-15 ENCOUNTER — Encounter: Payer: Medicaid Other | Admitting: Obstetrics & Gynecology

## 2013-10-21 ENCOUNTER — Encounter: Payer: Medicaid Other | Admitting: Obstetrics & Gynecology

## 2013-10-22 ENCOUNTER — Emergency Department (HOSPITAL_COMMUNITY)
Admission: EM | Admit: 2013-10-22 | Discharge: 2013-10-22 | Disposition: A | Discharge status: Home / Self Care | Payer: Medicaid Other | Attending: Emergency Medicine | Admitting: Emergency Medicine

## 2013-10-22 ENCOUNTER — Encounter (HOSPITAL_COMMUNITY): Payer: Self-pay | Admitting: Emergency Medicine

## 2013-10-22 ENCOUNTER — Telehealth: Payer: Self-pay | Admitting: Obstetrics & Gynecology

## 2013-10-22 DIAGNOSIS — Z8659 Personal history of other mental and behavioral disorders: Secondary | ICD-10-CM | POA: Insufficient documentation

## 2013-10-22 DIAGNOSIS — Z87442 Personal history of urinary calculi: Secondary | ICD-10-CM | POA: Insufficient documentation

## 2013-10-22 DIAGNOSIS — I10 Essential (primary) hypertension: Secondary | ICD-10-CM | POA: Insufficient documentation

## 2013-10-22 DIAGNOSIS — Z9889 Other specified postprocedural states: Secondary | ICD-10-CM | POA: Insufficient documentation

## 2013-10-22 DIAGNOSIS — J45909 Unspecified asthma, uncomplicated: Secondary | ICD-10-CM | POA: Insufficient documentation

## 2013-10-22 DIAGNOSIS — Z79899 Other long term (current) drug therapy: Secondary | ICD-10-CM | POA: Insufficient documentation

## 2013-10-22 DIAGNOSIS — R55 Syncope and collapse: Secondary | ICD-10-CM | POA: Insufficient documentation

## 2013-10-22 DIAGNOSIS — E663 Overweight: Secondary | ICD-10-CM | POA: Insufficient documentation

## 2013-10-22 DIAGNOSIS — Z87440 Personal history of urinary (tract) infections: Secondary | ICD-10-CM | POA: Insufficient documentation

## 2013-10-22 DIAGNOSIS — Z8619 Personal history of other infectious and parasitic diseases: Secondary | ICD-10-CM | POA: Insufficient documentation

## 2013-10-22 DIAGNOSIS — IMO0002 Reserved for concepts with insufficient information to code with codable children: Secondary | ICD-10-CM | POA: Insufficient documentation

## 2013-10-22 DIAGNOSIS — R4182 Altered mental status, unspecified: Secondary | ICD-10-CM | POA: Insufficient documentation

## 2013-10-22 DIAGNOSIS — I959 Hypotension, unspecified: Secondary | ICD-10-CM | POA: Insufficient documentation

## 2013-10-22 DIAGNOSIS — D649 Anemia, unspecified: Secondary | ICD-10-CM

## 2013-10-22 DIAGNOSIS — Y838 Other surgical procedures as the cause of abnormal reaction of the patient, or of later complication, without mention of misadventure at the time of the procedure: Secondary | ICD-10-CM | POA: Insufficient documentation

## 2013-10-22 DIAGNOSIS — D509 Iron deficiency anemia, unspecified: Secondary | ICD-10-CM | POA: Insufficient documentation

## 2013-10-22 LAB — CBC WITH DIFFERENTIAL/PLATELET
BASOS ABS: 0 10*3/uL (ref 0.0–0.1)
Basophils Relative: 0 % (ref 0–1)
EOS PCT: 0 % (ref 0–5)
Eosinophils Absolute: 0.1 10*3/uL (ref 0.0–0.7)
HCT: 30.1 % — ABNORMAL LOW (ref 36.0–46.0)
HEMOGLOBIN: 10.8 g/dL — AB (ref 12.0–15.0)
LYMPHS PCT: 10 % — AB (ref 12–46)
Lymphs Abs: 2.1 10*3/uL (ref 0.7–4.0)
MCH: 28.3 pg (ref 26.0–34.0)
MCHC: 35.9 g/dL (ref 30.0–36.0)
MCV: 79 fL (ref 78.0–100.0)
MONO ABS: 0.9 10*3/uL (ref 0.1–1.0)
MONOS PCT: 4 % (ref 3–12)
NEUTROS ABS: 18.9 10*3/uL — AB (ref 1.7–7.7)
Neutrophils Relative %: 86 % — ABNORMAL HIGH (ref 43–77)
Platelets: 297 10*3/uL (ref 150–400)
RBC: 3.81 MIL/uL — ABNORMAL LOW (ref 3.87–5.11)
RDW: 13.1 % (ref 11.5–15.5)
WBC: 22 10*3/uL — ABNORMAL HIGH (ref 4.0–10.5)

## 2013-10-22 LAB — BASIC METABOLIC PANEL
BUN: 12 mg/dL (ref 6–23)
CALCIUM: 8.7 mg/dL (ref 8.4–10.5)
CHLORIDE: 105 meq/L (ref 96–112)
CO2: 25 meq/L (ref 19–32)
Creatinine, Ser: 0.95 mg/dL (ref 0.50–1.10)
GFR calc Af Amer: 88 mL/min — ABNORMAL LOW (ref >90)
GFR calc non Af Amer: 76 mL/min — ABNORMAL LOW (ref >90)
GLUCOSE: 118 mg/dL — AB (ref 70–99)
Potassium: 4 mEq/L (ref 3.7–5.3)
Sodium: 140 mEq/L (ref 137–147)

## 2013-10-22 NOTE — ED Notes (Signed)
EMS reports pt had cervical polyp removed on Feb 11th.  Reports started having vaginal bleeding around 12noon today.  Called gynecologist and was told maybe she had just started her period.  Between 3pm and 4pm, had saturated 4 pads, soaked her car seat, was all over commode and bathtub at home.  EMS arrived to find pt was pale, diaphoretic, no radial pulses, confusion, and bp 58/46.   EMS administered 1 liter of nss and bp increased 120/73, hr 72.  Pt alert, oriented at this time.  Pt has radial pulse.

## 2013-10-22 NOTE — Discharge Instructions (Signed)
Conization of the Cervix, Care After Refer to this sheet in the next few weeks. These instructions provide you with information on caring for yourself after your procedure. Your health care provider may also give you more specific instructions. Your treatment has been planned according to current medical practices but problems sometimes occur. Call your health care provider if you have any problems or questions after your procedure. WHAT TO EXPECT AFTER THE PROCEDURE After your procedure, it is typical to have the following sensations:  If you had a general anesthetic, you may be groggy for 2 3 hours after the procedure.  You may have cramps (similar to menstrual cramps) for about 1 week.   You may have a bloody discharge or light to moderate bleeding for 1 2 weeks. The bleeding should not be heavy (for example, it should not soak 1 pad in less than 1 hour).  You may have a black vaginal discharge that looks similar to coffee grounds. This is from the paste that was applied to the cervix to control bleeding. This is normal. Recovery may take up to 3 weeks.  HOME CARE INSTRUCTIONS   Arrange for someone to drive you home after the procedure.  Only take medicines as directed by your health care provider. Do not take aspirin. It can cause bleeding.   Take showers for the first week. Do not take baths, swim, or use hot tubs until your health care provider says it is OK.   Do not douche, use tampons, or have sexual intercourse until your health care provider says it is OK.   Avoid strenuous activities, exercises, and heavy lifting for at least 7 14 days.  You may resume your normal diet unless your health care provider advises you differently.    If you are constipated, you may:  Take a mild laxative as directed by your health care provider.   Add fruit and bran to your diet.   Make sure to drink enough fluids to keep your urine clear or pale yellow.  Keep follow-up appointments  with your health care provider. SEEK MEDICAL CARE IF:   You develop a rash.   You are dizzy or lightheaded.   You feel nauseous.   You develop a bad smelling vaginal discharge. SEEK IMMEDIATE MEDICAL CARE IF:   You have blood clots or bleeding that is heavier than a normal menstrual period (for example, soaking a pad in less than 1 hour) or you develop bright red bleeding.   You have a fever over 101F (38.3C) or persistent symptoms for more than 2 3 days.   You have a fever over 101F (38.3C) and your symptoms suddenly get worse.  You have increasing cramps.   You faint.   You have pain when urinating.  You have bloody urine.   You start vomiting.   Your pain is not relieved with your medicine.   Your have severe or worsening pain. Document Released: 08/14/2005 Document Revised: 04/16/2013 Document Reviewed: 02/07/2013 [facility] Patient Information 2014 [employer].

## 2013-10-22 NOTE — ED Notes (Signed)
Dr. Elonda Husky at beside to place medication on to cervix.

## 2013-10-22 NOTE — ED Provider Notes (Signed)
CSN: [id number]     Arrival date & time 10/22/13  1814 History   First MD Initiated Contact with Patient 10/22/13 1831     Chief Complaint  Patient presents with  . Vaginal Bleeding  . Hypotension  . Loss of Consciousness     (Consider location/radiation/quality/duration/timing/severity/associated sxs/prior Treatment) Patient is a 38 y.o. female presenting with vaginal bleeding and syncope. The history is provided by the patient.  Vaginal Bleeding Loss of Consciousness  She presents by EMS for evaluation of vaginal bleeding, weakness and altered mental status. She began bleeding earlier this morning, it has been intermittent. He has passed clots and bright red blood. This evening, took a shower, felt weak and EMS was called. She had decreased responsiveness and was hypotensive. She was treated with IV fluids, with improvement of her blood pressure, and mentation. She had cervical conization on 10/08/13. She has not been sexually active since then. She denies chest pain, shortness of breath, abdominal pain, back pain, fever, or chills. There no other known modifying factors  Past Medical History  Diagnosis Date  . Overweight   . History of recurrent UTIs   . Anemia, iron deficiency   . Onychomycosis   . Anxiety   . Proteinuria   . Hypertension   . Asthma   . Menorrhagia   . Nephrolithiasis   . Kidney stones   . Uterine fibroid   . Abnormal pap     lSIL   . Human papilloma virus   . Abnormal Pap smear   . Contraceptive management 07/28/2013  . WCBJSEGB(151.7)    Past Surgical History  Procedure Laterality Date  . Rotary cuff - left shoulder  2005  . Kidney stones  june 2000  . Dilation and curettage of uterus    . Cervical conization w/bx N/A 10/08/2013    Procedure: CONIZATION CERVIX WITH BIOPSY;  Surgeon: Florian Buff, MD;  Location: [facility];  Service: Gynecology;  Laterality: N/A;  . Holmium laser application N/A 02/11/736    Procedure: HOLMIUM LASER APPLICATION;   Surgeon: Florian Buff, MD;  Location: [facility];  Service: Gynecology;  Laterality: N/A;   Family History  Problem Relation Age of Onset  . Fibromyalgia Mother   . Arthritis Mother   . Diabetes Mother   . Hypertension Mother   . Stroke Other   . Diabetes Brother    History  Substance Use Topics  . Smoking status: Never Smoker   . Smokeless tobacco: Never Used  . Alcohol Use: Yes     Comment: rare   OB History   Grav Para Term Preterm Abortions TAB SAB Ect Mult Living   7 4   3  3   4      Review of Systems  Cardiovascular: Positive for syncope.  Genitourinary: Positive for vaginal bleeding.  All other systems reviewed and are negative.      Allergies  Review of patient's allergies indicates no known allergies.  Home Medications   Current Outpatient Rx  Name  Route  Sig  Dispense  Refill  . amLODipine (NORVASC) 10 MG tablet   Oral   Take 1 tablet (10 mg total) by mouth daily.   30 tablet   11   . hydrochlorothiazide (HYDRODIURIL) 25 MG tablet   Oral   Take 12.5 mg by mouth daily.         . Multiple Vitamins-Minerals (MULTIVITAMINS THER. W/MINERALS) TABS tablet   Oral   Take 1 tablet by mouth daily.         Marland Kitchen  Norgestimate-Ethinyl Estradiol Triphasic 0.18/0.215/0.25 MG-35 MCG tablet   Oral   Take 1 tablet by mouth daily.   1 Package   11    BP 132/81  Pulse 68  Temp(Src) 98.3 F (36.8 C) (Oral)  Resp 16  Ht 5\' 4"  (1.626 m)  Wt 176 lb (79.833 kg)  BMI 30.20 kg/m2  SpO2 100%  LMP 09/28/2013 Physical Exam  Nursing note and vitals reviewed. Constitutional: She is oriented to person, place, and time. She appears well-developed and well-nourished.  HENT:  Head: Normocephalic and atraumatic.  Eyes: Conjunctivae and EOM are normal. Pupils are equal, round, and reactive to light.  Neck: Normal range of motion and phonation normal. Neck supple.  Cardiovascular: Normal rate, regular rhythm and intact distal pulses.   Pulmonary/Chest: Effort normal and  breath sounds normal. She exhibits no tenderness.  Abdominal: Soft. She exhibits no distension. There is no tenderness. There is no guarding.  Genitourinary:  Pelvic examination- normal external female genitalia. Bright red blood, and clots in the vagina. Blood clots were largely cleared using 6 Fox Swabs. The cervix was posteriorly, oriented, and was partially visualized. There appears to be some granulation tissue of the cervical os. She has moderate tenderness of the cervical os. The external cervical os appears open. Bimanual examination was deferred.  Musculoskeletal: Normal range of motion.  Neurological: She is alert and oriented to person, place, and time. She exhibits normal muscle tone.  Skin: Skin is warm and dry.  Psychiatric: She has a normal mood and affect. Her behavior is normal. Judgment and thought content normal.    ED Course  Procedures (including critical care time)  Medications - No data to display  No data found.  20:00- Consultation wit GYN- Dr. Elonda Husky- He will come to the ED to see and treat the patient.  8:02 PM Reevaluation with update and discussion. After initial assessment and treatment, an updated evaluation reveals she is comfortable. Repeat vitals normal. Chiyeko Ferre L   Labs Review Labs Reviewed  CBC WITH DIFFERENTIAL - Abnormal; Notable for the following:    WBC 22.0 (*)    RBC 3.81 (*)    Hemoglobin 10.8 (*)    HCT 30.1 (*)    Neutrophils Relative % 86 (*)    Neutro Abs 18.9 (*)    Lymphocytes Relative 10 (*)    All other components within normal limits  BASIC METABOLIC PANEL - Abnormal; Notable for the following:    Glucose, Bld 118 (*)    GFR calc non Af Amer 76 (*)    GFR calc Af Amer 88 (*)    All other components within normal limits   Imaging Review No results found.   EKG Interpretation  Date/Time:    Ventricular Rate:    PR Interval:    QRS Duration:   QT Interval:    QTC Calculation:   R Axis:     Text Interpretation:          MDM   Final diagnoses:  Postoperative vaginal bleeding  Anemia    Vaginal bleeding, post colposcopy. She is at significant drop in hemoglobin. She required evaluation in the emergency department by gynecology to arrange her treatment and disposition.    Nursing Notes Reviewed/ Care Coordinated, and agree without changes. Applicable Imaging Reviewed.  Interpretation of Laboratory Data incorporated into ED treatment  Disposition, as per gynecology    Richarda Blade, MD 10/24/13 2110

## 2013-10-22 NOTE — Progress Notes (Signed)
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  Has not been seen post op yet Missed 2 post op appointments due to work conflicts Pt began to have bleeding this afternoon while in the shower, had only been spotting post op up to this point Did not do anything she knew of to cause the bleeding Probably had vasovagal episode which precipitated the transport to [facility].  Exam Currently no active bleeding Monsel's placed with good paste formation again no bleeding now  Pt to keep her previously scheduled appointment with me on Monday for follow up Other instructions given  Results for orders placed during the hospital encounter of 10/22/13 (from the past 24 hour(s))  CBC WITH DIFFERENTIAL   Collection Time    10/22/13  7:10 PM      Result Value Ref Range   WBC 22.0 (*) 4.0 - 10.5 K/uL   RBC 3.81 (*) 3.87 - 5.11 MIL/uL   Hemoglobin 10.8 (*) 12.0 - 15.0 g/dL   HCT 30.1 (*) 36.0 - 46.0 %   MCV 79.0  78.0 - 100.0 fL   MCH 28.3  26.0 - 34.0 pg   MCHC 35.9  30.0 - 36.0 g/dL   RDW 13.1  11.5 - 15.5 %   Platelets 297  150 - 400 K/uL   Neutrophils Relative % 86 (*) 43 - 77 %   Neutro Abs 18.9 (*) 1.7 - 7.7 K/uL   Lymphocytes Relative 10 (*) 12 - 46 %   Lymphs Abs 2.1  0.7 - 4.0 K/uL   Monocytes Relative 4  3 - 12 %   Monocytes Absolute 0.9  0.1 - 1.0 K/uL   Eosinophils Relative 0  0 - 5 %   Eosinophils Absolute 0.1  0.0 - 0.7 K/uL   Basophils Relative 0  0 - 1 %   Basophils Absolute 0.0  0.0 - 0.1 K/uL  BASIC METABOLIC PANEL   Collection Time    10/22/13  7:10 PM      Result Value Ref Range   Sodium 140  137 - 147 mEq/L   Potassium 4.0  3.7 - 5.3 mEq/L   Chloride 105  96 - 112 mEq/L   CO2 25  19 - 32 mEq/L   Glucose, Bld 118 (*) 70 - 99 mg/dL   BUN 12  6 - 23 mg/dL   Creatinine, Ser 0.95  0.50 - 1.10 mg/dL   Calcium 8.7  8.4 - 10.5 mg/dL   GFR calc non Af Amer 76 (*) >90 mL/min   GFR calc Af Amer 88 (*) >90 mL/min

## 2013-10-24 NOTE — Telephone Encounter (Signed)
Late entry - spoke with Pt on 10/22/2013 in regards to heavy vaginal bleeding, pt states started bleeding at 12 pm on this same day was concerned because pt had colonization procedure on 10/08/2013. After speaking with Dr. Elonda Husky, pt informed to continue monitoring bleeding could be the beginning of a period. Pt also informed if bleeding increased or did not lighten up to call our office back or go to ER. Pt verbalized understanding.

## 2013-10-27 ENCOUNTER — Ambulatory Visit (INDEPENDENT_AMBULATORY_CARE_PROVIDER_SITE_OTHER): Payer: Medicaid Other | Admitting: Obstetrics & Gynecology

## 2013-10-27 ENCOUNTER — Encounter: Payer: Self-pay | Admitting: Obstetrics & Gynecology

## 2013-10-27 VITALS — BP 150/100 | Wt 176.0 lb

## 2013-10-27 DIAGNOSIS — D069 Carcinoma in situ of cervix, unspecified: Secondary | ICD-10-CM

## 2013-10-27 DIAGNOSIS — Z9889 Other specified postprocedural states: Secondary | ICD-10-CM

## 2013-10-27 NOTE — Progress Notes (Signed)
Patient ID: Angel Morris, female   DOB: 1976-01-15, 38 y.o.   MRN: [id number] See ER note from last week  Pt in today with minimal spotting bleeding Malodorous discharge, to be expedted  Exam Good hemostatic cervical cone bed monsel's placed again  Follow up in 6 months for first follow up pap  Past Medical History  Diagnosis Date  . Overweight   . History of recurrent UTIs   . Anemia, iron deficiency   . Onychomycosis   . Anxiety   . Proteinuria   . Hypertension   . Asthma   . Menorrhagia   . Nephrolithiasis   . Kidney stones   . Uterine fibroid   . Abnormal pap     lSIL   . Human papilloma virus   . Abnormal Pap smear   . Contraceptive management 07/28/2013  . LEXNTZGY(174.9)     Past Surgical History  Procedure Laterality Date  . Rotary cuff - left shoulder  2005  . Kidney stones  june 2000  . Dilation and curettage of uterus    . Cervical conization w/bx N/A 10/08/2013    Procedure: CONIZATION CERVIX WITH BIOPSY;  Surgeon: Florian Buff, MD;  Location: [facility];  Service: Gynecology;  Laterality: N/A;  . Holmium laser application N/A 4/49/6759    Procedure: HOLMIUM LASER APPLICATION;  Surgeon: Florian Buff, MD;  Location: [facility];  Service: Gynecology;  Laterality: N/A;    OB History   Grav Para Term Preterm Abortions TAB SAB Ect Mult Living   7 4   3  3   4       No Known Allergies  History   Social History  . Marital Status: Divorced    Spouse Name: N/A    Number of Children: N/A  . Years of Education: N/A   Social History Main Topics  . Smoking status: Never Smoker   . Smokeless tobacco: Never Used  . Alcohol Use: Yes     Comment: rare  . Drug Use: No  . Sexual Activity: Yes    Birth Control/ Protection: Pill   Other Topics Concern  . None   Social History Narrative  . None    Family History  Problem Relation Age of Onset  . Fibromyalgia Mother   . Arthritis Mother   . Diabetes Mother   . Hypertension Mother   . Stroke Other   .  Diabetes Brother

## 2014-04-14 ENCOUNTER — Other Ambulatory Visit (HOSPITAL_COMMUNITY)
Admission: RE | Admit: 2014-04-14 | Discharge: 2014-04-14 | Disposition: A | Discharge status: Home / Self Care | Payer: Medicaid Other | Source: Ambulatory Visit | Attending: Obstetrics & Gynecology | Admitting: Obstetrics & Gynecology

## 2014-04-14 ENCOUNTER — Ambulatory Visit (INDEPENDENT_AMBULATORY_CARE_PROVIDER_SITE_OTHER): Payer: Medicaid Other | Admitting: Obstetrics & Gynecology

## 2014-04-14 ENCOUNTER — Encounter: Payer: Self-pay | Admitting: Obstetrics & Gynecology

## 2014-04-14 VITALS — BP 160/108 | Ht 64.5 in | Wt 174.0 lb

## 2014-04-14 DIAGNOSIS — D069 Carcinoma in situ of cervix, unspecified: Secondary | ICD-10-CM

## 2014-04-14 DIAGNOSIS — Z01419 Encounter for gynecological examination (general) (routine) without abnormal findings: Secondary | ICD-10-CM | POA: Diagnosis present

## 2014-04-14 DIAGNOSIS — Z0142 Encounter for cervical smear to confirm findings of recent normal smear following initial abnormal smear: Secondary | ICD-10-CM

## 2014-04-14 MED ORDER — NORGESTIM-ETH ESTRAD TRIPHASIC 0.18/0.215/0.25 MG-35 MCG PO TABS: 1.0000 | ORAL_TABLET | Freq: Every day | ORAL | Status: DC

## 2014-04-14 NOTE — Progress Notes (Signed)
Patient ID: Angel Morris, female   DOB: 09/12/1975, 38 y.o.   MRN: [id number] Pt is S/P laser conization of the cervix in 09/2013 due to HSIL endocervical extension  Here for 1st follow up Pap  Obtained  Follow up Pap 6 months OCP refilled

## 2014-04-17 LAB — CYTOLOGY - PAP

## 2014-04-27 ENCOUNTER — Telehealth: Payer: Self-pay | Admitting: *Deleted

## 2014-04-27 ENCOUNTER — Other Ambulatory Visit: Payer: Self-pay | Admitting: Adult Health

## 2014-04-27 NOTE — Telephone Encounter (Signed)
Spoke with Angel Morris letting her know she has refills on the Trisprintec. Current Rx is at [facility] in [place], but she can have it transferred to [facility] in [place]. Angel Morris voiced understanding. [place]

## 2014-06-29 ENCOUNTER — Encounter: Payer: Self-pay | Admitting: Obstetrics & Gynecology

## 2014-08-22 ENCOUNTER — Encounter (HOSPITAL_COMMUNITY): Payer: Self-pay | Admitting: Emergency Medicine

## 2014-08-22 ENCOUNTER — Emergency Department (HOSPITAL_COMMUNITY)
Admission: EM | Admit: 2014-08-22 | Discharge: 2014-08-22 | Disposition: A | Discharge status: Home / Self Care | Payer: Medicaid Other | Attending: Emergency Medicine | Admitting: Emergency Medicine

## 2014-08-22 DIAGNOSIS — Z862 Personal history of diseases of the blood and blood-forming organs and certain disorders involving the immune mechanism: Secondary | ICD-10-CM | POA: Insufficient documentation

## 2014-08-22 DIAGNOSIS — Z792 Long term (current) use of antibiotics: Secondary | ICD-10-CM | POA: Diagnosis not present

## 2014-08-22 DIAGNOSIS — Z79899 Other long term (current) drug therapy: Secondary | ICD-10-CM | POA: Diagnosis not present

## 2014-08-22 DIAGNOSIS — F419 Anxiety disorder, unspecified: Secondary | ICD-10-CM | POA: Diagnosis not present

## 2014-08-22 DIAGNOSIS — Z3202 Encounter for pregnancy test, result negative: Secondary | ICD-10-CM | POA: Insufficient documentation

## 2014-08-22 DIAGNOSIS — J029 Acute pharyngitis, unspecified: Secondary | ICD-10-CM | POA: Diagnosis not present

## 2014-08-22 DIAGNOSIS — Z87442 Personal history of urinary calculi: Secondary | ICD-10-CM | POA: Diagnosis not present

## 2014-08-22 DIAGNOSIS — E663 Overweight: Secondary | ICD-10-CM | POA: Diagnosis not present

## 2014-08-22 DIAGNOSIS — I10 Essential (primary) hypertension: Secondary | ICD-10-CM | POA: Insufficient documentation

## 2014-08-22 DIAGNOSIS — Z8619 Personal history of other infectious and parasitic diseases: Secondary | ICD-10-CM | POA: Diagnosis not present

## 2014-08-22 DIAGNOSIS — R197 Diarrhea, unspecified: Secondary | ICD-10-CM | POA: Diagnosis not present

## 2014-08-22 DIAGNOSIS — J45909 Unspecified asthma, uncomplicated: Secondary | ICD-10-CM | POA: Diagnosis not present

## 2014-08-22 DIAGNOSIS — M549 Dorsalgia, unspecified: Secondary | ICD-10-CM | POA: Insufficient documentation

## 2014-08-22 LAB — URINALYSIS, ROUTINE W REFLEX MICROSCOPIC
BILIRUBIN URINE: NEGATIVE
Glucose, UA: NEGATIVE mg/dL
Hgb urine dipstick: NEGATIVE
Ketones, ur: NEGATIVE mg/dL
Leukocytes, UA: NEGATIVE
NITRITE: NEGATIVE
PH: 6 (ref 5.0–8.0)
Protein, ur: NEGATIVE mg/dL
SPECIFIC GRAVITY, URINE: 1.024 (ref 1.005–1.030)
UROBILINOGEN UA: 1 mg/dL (ref 0.0–1.0)

## 2014-08-22 LAB — PREGNANCY, URINE: PREG TEST UR: NEGATIVE

## 2014-08-22 LAB — RAPID STREP SCREEN (MED CTR MEBANE ONLY): STREPTOCOCCUS, GROUP A SCREEN (DIRECT): NEGATIVE

## 2014-08-22 MED ORDER — AMLODIPINE BESYLATE 10 MG PO TABS: 10.0000 mg | ORAL_TABLET | Freq: Every day | ORAL | Status: DC

## 2014-08-22 MED ORDER — AMOXICILLIN 500 MG PO CAPS: 500.0000 mg | ORAL_CAPSULE | Freq: Three times a day (TID) | ORAL | Status: DC

## 2014-08-22 MED ORDER — HYDROCODONE-ACETAMINOPHEN 7.5-325 MG/15ML PO SOLN: 15.0000 mL | ORAL | Status: DC | PRN

## 2014-08-22 MED ORDER — HYDROCHLOROTHIAZIDE 25 MG PO TABS: 12.5000 mg | ORAL_TABLET | Freq: Every day | ORAL | Status: DC

## 2014-08-22 NOTE — Discharge Instructions (Signed)

## 2014-08-22 NOTE — ED Notes (Signed)
Pt c/o sore throat x one week. Throat got better, but is now getting worse. Also c/o back pain. Hx of kidney infections. C/o fever, chills. Has not taken BP meds in 6 months. Pt alert, no acute distress. Skin warm and dry.

## 2014-08-22 NOTE — ED Provider Notes (Signed)
CSN: [id number]     Arrival date & time 08/22/14  1434 History  This chart was scribed for non-physician practitioner Delos Haring, PA-C working with Debby Freiberg, MD by Lora Havens, ED Scribe. This patient was seen in [facility] and the patient's care was started at 4:38 PM.   Chief Complaint  Patient presents with  . Sore Throat  . Back Pain   Patient is a 38 y.o. female presenting with pharyngitis and back pain. The history is provided by the patient. No language interpreter was used.  Sore Throat Associated symptoms include abdominal pain.  Back Pain Associated symptoms: abdominal pain and fever     HPI Comments: Angel Morris is a 38 y.o. female who presents to the Emergency Department complaining of sore throat, onset 1 week ago. Pt states the sore throat subsided and returned 1 day ago. Pt has a cough, subjective fever, chills, diaphoresis, generalized body aches, and voice change as associated symptoms. She has taken Dayquil and Nyquil for relief. She denies abdominal pain, diarrhea, vomiting, nausea, sinus congestion  She also complains of lower back pain, onset 2 weeks ago. Pt notes having a Hx of kidney infections and kidney stones.  Pt is currently on her period and notes excessive bleeding but this has recently subsided  Past Medical History  Diagnosis Date  . Overweight(278.02)   . History of recurrent UTIs   . Anemia, iron deficiency   . Onychomycosis   . Anxiety   . Proteinuria   . Hypertension   . Asthma   . Menorrhagia   . Nephrolithiasis   . Kidney stones   . Uterine fibroid   . Abnormal pap     lSIL   . Human papilloma virus   . Abnormal Pap smear   . Contraceptive management 07/28/2013  . TGPQDIYM(415.8)    Past Surgical History  Procedure Laterality Date  . Rotary cuff - left shoulder  2005  . Kidney stones  june 2000  . Dilation and curettage of uterus    . Cervical conization w/bx N/A 10/08/2013    Procedure: CONIZATION CERVIX WITH  BIOPSY;  Surgeon: Florian Buff, MD;  Location: [facility];  Service: Gynecology;  Laterality: N/A;  . Holmium laser application N/A 11/04/4074    Procedure: HOLMIUM LASER APPLICATION;  Surgeon: Florian Buff, MD;  Location: [facility];  Service: Gynecology;  Laterality: N/A;   Family History  Problem Relation Age of Onset  . Fibromyalgia Mother   . Arthritis Mother   . Diabetes Mother   . Hypertension Mother   . Stroke Other   . Diabetes Brother    History  Substance Use Topics  . Smoking status: Never Smoker   . Smokeless tobacco: Never Used  . Alcohol Use: Yes     Comment: rare   OB History    Gravida Para Term Preterm AB TAB SAB Ectopic Multiple Living   7 4   3  3   4      Review of Systems  Constitutional: Positive for fever, chills and diaphoresis.  HENT: Positive for voice change.   Respiratory: Positive for cough.   Gastrointestinal: Positive for nausea, vomiting, abdominal pain and diarrhea.  Musculoskeletal: Positive for myalgias and back pain.  All other systems reviewed and are negative.   Allergies  Review of patient's allergies indicates no known allergies.  Home Medications   Prior to Admission medications   Medication Sig Start Date End Date Taking? Authorizing Provider  Multiple Vitamins-Minerals (MULTIVITAMINS  THER. W/MINERALS) TABS tablet Take 1 tablet by mouth daily.   Yes Historical Provider, MD  TRI-SPRINTEC 0.18/0.215/0.25 MG-35 MCG tablet TAKE ONE TABLET BY MOUTH ONCE DAILY 04/27/14  Yes Estill Dooms, NP  amLODipine (NORVASC) 10 MG tablet Take 1 tablet (10 mg total) by mouth daily. 08/22/14   Corabelle Marilu Favre, PA-C  amoxicillin (AMOXIL) 500 MG capsule Take 1 capsule (500 mg total) by mouth 3 (three) times daily. 08/22/14   Berlyn Marilu Favre, PA-C  hydrochlorothiazide (HYDRODIURIL) 25 MG tablet Take 0.5 tablets (12.5 mg total) by mouth daily. 08/22/14   Raeanna Marilu Favre, PA-C  HYDROcodone-acetaminophen (HYCET) 7.5-325 mg/15 ml solution Take 15 mLs by  mouth every 4 (four) hours as needed for moderate pain or severe pain. 08/22/14   Niyati Marilu Favre, PA-C   BP 155/113 mmHg  Pulse 81  Temp(Src) 98.6 F (37 C) (Oral)  Resp 16  SpO2 99%  LMP 08/12/2014 Physical Exam  Constitutional: She is oriented to person, place, and time. She appears well-developed and well-nourished. No distress.  HENT:  Head: Normocephalic and atraumatic.  Right Ear: Tympanic membrane, external ear and ear canal normal.  Left Ear: Tympanic membrane, external ear and ear canal normal.  Nose: Nose normal. No rhinorrhea. Right sinus exhibits no maxillary sinus tenderness and no frontal sinus tenderness. Left sinus exhibits no maxillary sinus tenderness and no frontal sinus tenderness.  Mouth/Throat: Uvula is midline and mucous membranes are normal. No trismus in the jaw. Normal dentition. No dental abscesses or uvula swelling. Oropharyngeal exudate and posterior oropharyngeal edema present. No posterior oropharyngeal erythema or tonsillar abscesses.  No submental edema, tongue not elevated, no trismus. No impending airway obstruction; Pt able to speak full sentences, swallow intact, no drooling, stridor, or tonsillar/uvula displacement. No palatal petechia  Eyes: Conjunctivae and EOM are normal.  Neck: Trachea normal, normal range of motion and full passive range of motion without pain. Neck supple. No rigidity. Normal range of motion present. No Brudzinski's sign noted.  Flexion and extension of neck without pain or difficulty. Able to breath without difficulty in extension.  Cardiovascular: Normal rate and regular rhythm.   Pulmonary/Chest: Effort normal and breath sounds normal. No stridor. No respiratory distress. She has no wheezes.  Abdominal: Soft. There is no tenderness.  No obvious evidence of splenomegaly. Non ttp.   Musculoskeletal: Normal range of motion.  Lymphadenopathy:       Head (right side): No preauricular and no posterior auricular adenopathy present.        Head (left side): No preauricular and no posterior auricular adenopathy present.    She has cervical adenopathy.  Neurological: She is alert and oriented to person, place, and time.  Skin: Skin is warm and dry. No rash noted. She is not diaphoretic.  Psychiatric: She has a normal mood and affect. Her behavior is normal.  Nursing note and vitals reviewed.   ED Course  Procedures  DIAGNOSTIC STUDIES: Oxygen Saturation is 99% on room air, normal by my interpretation.    COORDINATION OF CARE: 4:42 PM Discussed treatment plan with pt at bedside and pt agreed to plan.  Labs Review Labs Reviewed  URINALYSIS, ROUTINE W REFLEX MICROSCOPIC - Abnormal; Notable for the following:    APPearance CLOUDY (*)    All other components within normal limits  RAPID STREP SCREEN  CULTURE, GROUP A STREP  PREGNANCY, URINE   Imaging Review No results found.   EKG Interpretation None      MDM   Final  diagnoses:  Pharyngitis    Rx: Hycet and Amoxicillin  Take antibiotic in completion. Continue to stay well-hydrated. Gargle warm salt water and spit it out. Continued to alternate between Tylenol and ibuprofen for pain or prescribed medication  But not both. Followup with your primary care doctor in 5-7 days for recheck of ongoing symptoms that return to emergency department for emergent changing or worsening of symptoms.  BP meds refilled, pt out of refills.  38 y.o.Angel Morris's evaluation in the Emergency Department is complete. It has been determined that no acute conditions requiring further emergency intervention are present at this time. The patient/guardian have been advised of the diagnosis and plan. We have discussed signs and symptoms that warrant return to the ED, such as changes or worsening in symptoms.  Vital signs are stable at discharge. Filed Vitals:   08/22/14 1520  BP: 155/113  Pulse: 81  Temp: 98.6 F (37 C)  Resp: 16    Patient/guardian has voiced  understanding and agreed to follow-up with the PCP or specialist. I personally performed the services described in this documentation, which was scribed in my presence. The recorded information has been reviewed and is accurate.     Linus Mako, PA-C 08/22/14 1702  Linus Mako, PA-C 08/22/14 1702  Debby Freiberg, MD 08/29/14 1047

## 2014-08-24 LAB — CULTURE, GROUP A STREP

## 2014-09-28 ENCOUNTER — Other Ambulatory Visit: Payer: Self-pay | Admitting: Adult Health

## 2014-10-15 ENCOUNTER — Other Ambulatory Visit: Payer: Medicaid Other | Admitting: Obstetrics & Gynecology

## 2015-01-13 ENCOUNTER — Encounter (HOSPITAL_COMMUNITY): Payer: Self-pay

## 2015-01-13 ENCOUNTER — Emergency Department (HOSPITAL_COMMUNITY)
Admission: EM | Admit: 2015-01-13 | Discharge: 2015-01-13 | Disposition: A | Discharge status: Home / Self Care | Payer: Medicaid Other | Attending: Emergency Medicine | Admitting: Emergency Medicine

## 2015-01-13 DIAGNOSIS — R51 Headache: Secondary | ICD-10-CM | POA: Insufficient documentation

## 2015-01-13 DIAGNOSIS — R519 Headache, unspecified: Secondary | ICD-10-CM

## 2015-01-13 DIAGNOSIS — Z79899 Other long term (current) drug therapy: Secondary | ICD-10-CM | POA: Insufficient documentation

## 2015-01-13 DIAGNOSIS — Z8739 Personal history of other diseases of the musculoskeletal system and connective tissue: Secondary | ICD-10-CM | POA: Insufficient documentation

## 2015-01-13 DIAGNOSIS — J45909 Unspecified asthma, uncomplicated: Secondary | ICD-10-CM | POA: Insufficient documentation

## 2015-01-13 DIAGNOSIS — Z8659 Personal history of other mental and behavioral disorders: Secondary | ICD-10-CM | POA: Diagnosis not present

## 2015-01-13 DIAGNOSIS — Z862 Personal history of diseases of the blood and blood-forming organs and certain disorders involving the immune mechanism: Secondary | ICD-10-CM | POA: Diagnosis not present

## 2015-01-13 DIAGNOSIS — Z87442 Personal history of urinary calculi: Secondary | ICD-10-CM | POA: Diagnosis not present

## 2015-01-13 DIAGNOSIS — I10 Essential (primary) hypertension: Secondary | ICD-10-CM

## 2015-01-13 DIAGNOSIS — Z8639 Personal history of other endocrine, nutritional and metabolic disease: Secondary | ICD-10-CM | POA: Insufficient documentation

## 2015-01-13 DIAGNOSIS — Z87440 Personal history of urinary (tract) infections: Secondary | ICD-10-CM | POA: Insufficient documentation

## 2015-01-13 DIAGNOSIS — Z792 Long term (current) use of antibiotics: Secondary | ICD-10-CM | POA: Insufficient documentation

## 2015-01-13 LAB — BASIC METABOLIC PANEL
Anion gap: 13 (ref 5–15)
BUN: 13 mg/dL (ref 6–20)
CHLORIDE: 99 mmol/L — AB (ref 101–111)
CO2: 25 mmol/L (ref 22–32)
CREATININE: 0.8 mg/dL (ref 0.44–1.00)
Calcium: 9.5 mg/dL (ref 8.9–10.3)
GFR calc Af Amer: >60 mL/min (ref >60)
GFR calc non Af Amer: >60 mL/min (ref >60)
Glucose, Bld: 119 mg/dL — ABNORMAL HIGH (ref 65–99)
Potassium: 3.1 mmol/L — ABNORMAL LOW (ref 3.5–5.1)
Sodium: 137 mmol/L (ref 135–145)

## 2015-01-13 LAB — URINALYSIS, ROUTINE W REFLEX MICROSCOPIC
Bilirubin Urine: NEGATIVE
Glucose, UA: NEGATIVE mg/dL
Hgb urine dipstick: NEGATIVE
Ketones, ur: NEGATIVE mg/dL
Leukocytes, UA: NEGATIVE
Nitrite: NEGATIVE
Protein, ur: NEGATIVE mg/dL
Specific Gravity, Urine: 1.016 (ref 1.005–1.030)
Urobilinogen, UA: 0.2 mg/dL (ref 0.0–1.0)
pH: 6 (ref 5.0–8.0)

## 2015-01-13 LAB — CBC
HCT: 40 % (ref 36.0–46.0)
Hemoglobin: 14 g/dL (ref 12.0–15.0)
MCH: 27.9 pg (ref 26.0–34.0)
MCHC: 35 g/dL (ref 30.0–36.0)
MCV: 79.7 fL (ref 78.0–100.0)
PLATELETS: 198 10*3/uL (ref 150–400)
RBC: 5.02 MIL/uL (ref 3.87–5.11)
RDW: 13.3 % (ref 11.5–15.5)
WBC: 10.2 10*3/uL (ref 4.0–10.5)

## 2015-01-13 MED ORDER — SODIUM CHLORIDE 0.9 % IV BOLUS (SEPSIS)
1000.0000 mL | Freq: Once | INTRAVENOUS | Status: AC
  Administered 2015-01-13: 1000 mL via INTRAVENOUS

## 2015-01-13 MED ORDER — HYDROCHLOROTHIAZIDE 25 MG PO TABS
12.5000 mg | ORAL_TABLET | Freq: Every day | ORAL | Status: DC
Start: 2015-01-13 — End: 2015-03-15

## 2015-01-13 MED ORDER — DIPHENHYDRAMINE HCL 25 MG PO CAPS
25.0000 mg | ORAL_CAPSULE | Freq: Once | ORAL | Status: AC
  Administered 2015-01-13: 25 mg via ORAL
  Filled 2015-01-13: qty 1

## 2015-01-13 MED ORDER — AMLODIPINE BESYLATE 10 MG PO TABS: 10.0000 mg | ORAL_TABLET | Freq: Every day | ORAL | Status: DC

## 2015-01-13 MED ORDER — DEXAMETHASONE SODIUM PHOSPHATE 10 MG/ML IJ SOLN
10.0000 mg | Freq: Once | INTRAMUSCULAR | Status: AC
  Administered 2015-01-13: 10 mg via INTRAVENOUS
  Filled 2015-01-13: qty 1

## 2015-01-13 MED ORDER — METOCLOPRAMIDE HCL 5 MG/ML IJ SOLN
5.0000 mg | Freq: Once | INTRAMUSCULAR | Status: AC
Start: 2015-01-13 — End: 2015-01-13
  Administered 2015-01-13: 5 mg via INTRAVENOUS
  Filled 2015-01-13: qty 2

## 2015-01-13 MED ORDER — POTASSIUM CHLORIDE ER 10 MEQ PO TBCR
10.0000 meq | EXTENDED_RELEASE_TABLET | Freq: Every day | ORAL | Status: DC
Start: 2015-01-13 — End: 2015-03-29

## 2015-01-13 NOTE — ED Provider Notes (Signed)
CSN: [id number]     Arrival date & time 01/13/15  1315 History   First MD Initiated Contact with Patient 01/13/15 1529     Chief Complaint  Patient presents with  . Hypertension  . Headache   HPI   39 year old female with a history of hypertension presents today with elevated blood pressure and headache. Patient reports 10 year history of hypertension for which she's received oral medication. She reports that she has been taking his HCTZ and amlodipine but notes that she gets occasional cramps while taking these medications. She reports topics medications approximately 2 months ago, had her blood pressure checked yesterday with a systolic reading in the 017 range, she reports that she does not have a primary care provider as she does not have insurance, medication being supplied by her OB/GYN. Patient reports a long-standing history of daily headaches, described as diffuse; nonfocal. She denies radiation of symptoms, changes in vision, taste, smelling, focal neurological deficits. Patient reports that the symptoms are resolved with ibuprofen therapy. Patient denies chest pain, shortness of breath, abdominal pain, changes in her urinary habits frequencies or characteristics. She denies taking any other medications, drug use. Patient denies any other concerning signs or symptoms at today's evaluation. States that she just started a new job and will have insurance at the end of the month.    Past Medical History  Diagnosis Date  . Overweight(278.02)   . History of recurrent UTIs   . Anemia, iron deficiency   . Onychomycosis   . Anxiety   . Proteinuria   . Hypertension   . Asthma   . Menorrhagia   . Nephrolithiasis   . Kidney stones   . Uterine fibroid   . Abnormal pap     lSIL   . Human papilloma virus   . Abnormal Pap smear   . Contraceptive management 07/28/2013  . BLTJQZES(923.3)    Past Surgical History  Procedure Laterality Date  . Rotary cuff - left shoulder  2005  . Kidney  stones  june 2000  . Dilation and curettage of uterus    . Cervical conization w/bx N/A 10/08/2013    Procedure: CONIZATION CERVIX WITH BIOPSY;  Surgeon: Florian Buff, MD;  Location: [facility];  Service: Gynecology;  Laterality: N/A;  . Holmium laser application N/A 0/02/6225    Procedure: HOLMIUM LASER APPLICATION;  Surgeon: Florian Buff, MD;  Location: [facility];  Service: Gynecology;  Laterality: N/A;   Family History  Problem Relation Age of Onset  . Fibromyalgia Mother   . Arthritis Mother   . Diabetes Mother   . Hypertension Mother   . Stroke Other   . Diabetes Brother    History  Substance Use Topics  . Smoking status: Never Smoker   . Smokeless tobacco: Never Used  . Alcohol Use: Yes     Comment: rare   OB History    Gravida Para Term Preterm AB TAB SAB Ectopic Multiple Living   7 4   3  3   4      Review of Systems  All other systems reviewed and are negative.   Allergies  Review of patient's allergies indicates no known allergies.  Home Medications   Prior to Admission medications   Medication Sig Start Date End Date Taking? Authorizing Provider  amLODipine (NORVASC) 10 MG tablet Take 1 tablet (10 mg total) by mouth daily. 08/22/14  Yes Skya Carlota Raspberry, PA-C  hydrochlorothiazide (HYDRODIURIL) 25 MG tablet Take 0.5 tablets (12.5 mg total)  by mouth daily. 08/22/14  Yes Taneal Carlota Raspberry, PA-C  ibuprofen (ADVIL,MOTRIN) 200 MG tablet Take 800 mg by mouth 3 (three) times daily. For head ache   Yes Historical Provider, MD  Multiple Vitamins-Minerals (MULTIVITAMINS THER. W/MINERALS) TABS tablet Take 1 tablet by mouth daily.   Yes Historical Provider, MD  Norgestimate-Ethinyl Estradiol Triphasic (TRI-SPRINTEC) 0.18/0.215/0.25 MG-35 MCG tablet TAKE ONE TABLET BY MOUTH ONCE DAILY 09/28/14  Yes Estill Dooms, NP  amoxicillin (AMOXIL) 500 MG capsule Take 1 capsule (500 mg total) by mouth 3 (three) times daily. Patient not taking: Reported on 01/13/2015 08/22/14   Delos Haring,  PA-C  HYDROcodone-acetaminophen (HYCET) 7.5-325 mg/15 ml solution Take 15 mLs by mouth every 4 (four) hours as needed for moderate pain or severe pain. Patient not taking: Reported on 01/13/2015 08/22/14   Delos Haring, PA-C   BP 165/110 mmHg  Pulse 95  Temp(Src) 98.6 F (37 C) (Oral)  Resp 20  SpO2 94%  LMP 12/18/2014 Physical Exam  Constitutional: She is oriented to person, place, and time. She appears well-developed and well-nourished.  HENT:  Head: Normocephalic and atraumatic.  Eyes: Pupils are equal, round, and reactive to light.  Neck: Normal range of motion. Neck supple. No JVD present. No tracheal deviation present. No thyromegaly present.  Cardiovascular: Regular rhythm, normal heart sounds and intact distal pulses.  Exam reveals no gallop and no friction rub.   No murmur heard. Pulmonary/Chest: Effort normal and breath sounds normal. No stridor. No respiratory distress. She has no wheezes. She has no rales. She exhibits no tenderness.  Abdominal: Soft. She exhibits no mass. There is no tenderness. There is no rebound and no guarding.  Musculoskeletal: Normal range of motion.  Lymphadenopathy:    She has no cervical adenopathy.  Neurological: She is alert and oriented to person, place, and time. She has normal strength. No cranial nerve deficit or sensory deficit. Coordination normal. GCS eye subscore is 4. GCS verbal subscore is 5. GCS motor subscore is 6.  Reflex Scores:      Patellar reflexes are 2+ on the right side and 2+ on the left side. Skin: Skin is warm and dry.  Psychiatric: She has a normal mood and affect. Her behavior is normal. Judgment and thought content normal.  Nursing note and vitals reviewed.   ED Course  Procedures (including critical care time) Labs Review Labs Reviewed  BASIC METABOLIC PANEL - Abnormal; Notable for the following:    Potassium 3.1 (*)    Chloride 99 (*)    Glucose, Bld 119 (*)    All other components within normal limits  CBC   URINALYSIS, ROUTINE W REFLEX MICROSCOPIC    Imaging Review No results found.   EKG Interpretation None      MDM   Final diagnoses:  Essential hypertension  Headache, unspecified headache type    Labs: BMP, CBC  Imaging: none  Consults: none  Therapeutics: Reglan, Benadryl, Decadron  Assessment: Headache, hypertension  Plan: Patient was treated in the ED with complete resolution of headache symptoms. Reevaluation of blood pressure showed 143/82, no concern for end organ involvement. Kidneys preserved, normal neuro exam. Patient is instructed to continue taking her home hypertension medication, follow up with primary care provider for further evaluation and management of hypertension. She is given strict return precautions the event new or worsening signs or symptoms presented, she verbalized her understanding and agreement for today's plan and had no further questions or concerns at time of discharge.  Okey Regal, PA-C 01/14/15 0144  Pamella Pert, MD 01/14/15 [id number]

## 2015-01-13 NOTE — Discharge Instructions (Signed)
°Emergency Department Resource Guide °1) Find a Doctor and Pay Out of Pocket °Although you won't have to find out who is covered by your insurance plan, it is a good idea to ask around and get recommendations. You will then need to call the office and see if the doctor you have chosen will accept you as a new patient and what types of options they offer for patients who are self-pay. Some doctors offer discounts or will set up payment plans for their patients who do not have insurance, but you will need to ask so you aren't surprised when you get to your appointment. ° °2) Contact Your Local Health Department °Not all health departments have doctors that can see patients for sick visits, but many do, so it is worth a call to see if yours does. If you don't know where your local health department is, you can check in your phone book. The CDC also has a tool to help you locate your state's health department, and many state websites also have listings of all of their local health departments. ° °3) Find a Walk-in Clinic °If your illness is not likely to be very severe or complicated, you may want to try a walk in clinic. These are popping up all over the country in pharmacies, drugstores, and shopping centers. They're usually staffed by nurse practitioners or physician assistants that have been trained to treat common illnesses and complaints. They're usually fairly quick and inexpensive. However, if you have serious medical issues or chronic medical problems, these are probably not your best option. ° °No Primary Care Doctor: °- Call [facility] at  832-8000 - they can help you locate a primary care doctor that  accepts your insurance, provides certain services, etc. °- Physician Referral Service- 1-800-533-3463 ° °Chronic Pain Problems: °Organization         Address  Phone   Notes  °[facility]  (336) 297-2271 Patients need to be referred by their primary care doctor.  ° °Medication  Assistance: °Organization         Address  Phone   Notes  °[place] [facility] [address] °[place], [place] [address] (336) 641-8030 --Must be a resident of [place] °-- Must have NO insurance coverage whatsoever (no Medicaid/ Medicare, etc.) °-- The pt. MUST have a primary care doctor that directs their care regularly and follows them in the community °  °[facility]  (866) 331-1348   °[employer]  (888) 892-1162   ° °Agencies that provide inexpensive medical care: °Organization         Address  Phone   Notes  °[facility]  (336) 832-8035   °[facility]    (336) 832-7272   °[facility] [address] °[place],  [address] (336) 832-4777   °[facility] [address], °[place] (336) 271-4999   °[facility]    (336) 373-0678   °[facility]    (336) 272-1050   °[facility] ° [address], [place] Phone:  (336) 832-4444, Fax:  (336) 832-4440 Hours of Operation:  9 am - 6 pm, M-F.  Also accepts Medicaid/Medicare and self-pay.  °[facility] ° [address], [place] Phone: (336) 832-3150, Fax: (336) 832-3151. Hours of Operation:  8:30 am - 5:30 pm, M-F.  Also accepts Medicaid and self-pay.  °[facility] [place] [address], [place] Phone: (336) 878-6027   °[facility] [address], [place], [place] (336)723-1848, Ext. 123 Mondays & Thursdays: 7-9 AM.  First 15 patients are seen on a first come, first serve basis. °  ° °Medicaid-accepting [place] Providers: ° °Organization         Address  Phone   Notes  °[facility] [address], [place] (336) 641-2100 Also accepts self-pay patients.  °[facility] [address], [place] ° (336) 856-9996   °[facility] [address], [place]  (336) 288-8857   °[facility] [address] [place] [address], [place] (336) 299-7000   °Veita Bland [address], [place]  ° (336) 373-1557 Only accepts [place] Access Medicaid patients after they have their name applied to their card.  ° °Self-Pay (no insurance) in [place]: ° °Organization         Address  Phone   Notes  °Sickle Cell Patients, [facility] [address], [place] (336) 832-1970   °[facility] [address], [place] (336) 832-4400   °[facility] [place] ° [address], [place] (336) 992-4800   °[facility]/Dr. Osei-Bonsu ° [address] [place] [address], [place] or [address], [place] (336) 841-8500 Phone number for both [place] and [place] locations is the same.  °[facility] [address], [place] (336) 299-0000   °[facility] [address] [place] [address], [place] or [address] (336) 852-7530 °(336) 878-2260   °[facility] [address], [place] (336) 350-1642, phone; (336) 294-5005, fax Sees patients 1st and 3rd Saturday of every month.  Must not qualify for public or private insurance (i.e. Medicaid, Medicare, [place] Health Choice, Veterans' Benefits) • Household income should be no more than 200% of the poverty level •The clinic cannot treat you if you are pregnant or think you are pregnant • Sexually transmitted diseases are not treated at the clinic.  ° ° °Dental Care: °Organization         Address  Phone  Notes  °[place] [facility] [address], [place] (336) 641-6152 Accepts children up to age 21 who are enrolled in Medicaid or [place] Health Choice; pregnant women with a Medicaid card; and children who have applied for Medicaid or [place] Health Choice, but were declined, whose parents can pay a reduced fee at time of service.  °[place]  [facility] [place]  [address], [place] (336) 641-7733 Accepts children up to age 21 who are enrolled in Medicaid or [place] Health Choice; pregnant women with a Medicaid card; and children who have applied for Medicaid or  Health Choice, but were declined, whose parents can pay a reduced fee at time of service.  °[facility] ° [address],  (336) 641-4533 Patients are seen by appointment only. Walk-ins are not accepted. [facility] will see patients 18 years of age and older. °Monday - Tuesday (8am-5pm) °Most Wednesdays (8:30-5pm) °$30 per visit, cash only  °[facility] ° [address], [place] (336) 641-4533 Patients are seen by appointment only. Walk-ins are not accepted. [facility] will see patients 18 years of age and older. °One   Wednesday Evening (Monthly: Volunteer Based).  $30 per visit, cash only  °[facility]  (919) 537-3737 for adults; Children under age 4, call Graduate Pediatric Dentistry at ([phone]) [phone]. Children aged 4-14, please call (919) [phone] to request a pediatric application. ° Dental services are provided in all areas of dental care including fillings, crowns and bridges, complete and partial dentures, implants, gum treatment, root canals, and extractions. Preventive care is also provided. Treatment is provided to both adults and children. °Patients are selected via a lottery and there is often a waiting list. °  °[facility] [address], °[place] ° (336) 763-8833 [email or web address].drcivils.[email or web address] °  °[facility] [address], [place], [place] (336)723-1848, Ext. 123 Second and Fourth Thursday of each month, opens at 6:30 AM; Clinic ends at 9 AM.  Patients are seen on a first-come first-served basis, and a limited number are seen during each clinic.  ° °[facility] ° [address], [place], [place] (336) 723-7904    Eligibility Requirements °You must have lived in [place], [place], or [place] counties for at least the last three months. °  You cannot be eligible for state or federal sponsored healthcare insurance, including Veterans Administration, Medicaid, or Medicare. °  You generally cannot be eligible for healthcare insurance through your employer.  °  How to apply: °Eligibility screenings are held every Tuesday and Wednesday afternoon from 1:00 pm until 4:00 pm. You do not need an appointment for the interview!  °[facility] [address], [place], [place] 336-631-2330   °[facility]  336-342-8273   °[place]  336-703-3100   °[facility]  336-570-6415   ° °Behavioral Health Resources in the Community: °Intensive Outpatient Programs °Organization         Address  Phone  Notes  °[facility] [address], [place], [place] 336-878-6098   °[facility] [address], [place], [place] 336-832-9800   °[facility] [address], [place], [place] ° 336-882-2125   °[facility] [address],  °[place], [place] 1-800-853-5163 or 336-641-4981   °Substance Abuse Resources °Organization         Address  Phone  Notes  °[facility]  336-882-2125   °[facility]  336-784-9470   °[facility]  336-285-9073   °[facility]  336-845-3988   °Residential & Outpatient Substance Abuse Program  1-800-659-3381   °Psychological Services °Organization         Address  Phone  Notes  °[facility]  336- 832-9600   °[facility]  336- 378-7881   °[facility] [address], [place] 1-800-853-5163 or 336-641-4981   ° °Mobile Crisis Teams °Organization         Address  Phone  Notes  °[facility], Mobile Crisis Care Unit  1-877-626-1772   °Assertive °[facility] ° [address]  [place], [place] 336-834-9664   °Sharon DeEsch [address] °[place] 336-554-5454   ° °Self-Help/Support Groups °Organization         Address  Phone             Notes  °[facility]  - variety of support groups  336- 373-1402 Call for more information  °Narcotics Anonymous (NA), [facility] [address], °[place]  2 meetings at this location  ° °  Residential Treatment Programs Organization         Address  Phone  Notes  [facility] [address]    [place]  1-(612) 347-1923   [facility]  [address] [place] [address], [place], [place]   [place], [place] (386)665-7846 Admissions: 8am-3pm M-F  [facility] [address],    [place], [place] [phone]   [facility] [address] [place], [place], [place]   [facility] [address]  [place], [place]   [facility] - Intensive Outpatient [address], [place], [place]   [facility] [place]  [place], [place] 1-9301425473 or (916)[phone]   [facility] [address] [place], [place] Accepts Medicaid  [facility] [address]  [place] 1-704-748-2445 Substance Abuse/Addiction Treatment   [facility] Organization         Address  Phone  Notes  [facility]  830-083-7251   Domenic Schwab, PhD [address] [place], [place]   (769) 147-4904 or [phone]   [facility] [place], [place] 954-130-1312   [facility] [address] [place], [place] (628)304-8924 Insurance/Medicaid/sponsorship through [facility] and Families [address] [place]                                    [place], [place] [phone] [facility] [address], [place] (604)[phone]    Dr. Adele Schilder  [phone]   [facility] 1) [address], [place] 2) [place] 3)  [address], [place] [phone]  ([phone]) [phone]   [place] 7873305962 or [phone] (After Hours)     Please follow up with PCP, resource guide attached. Please follow-up with [facility] if you are unable to schedule with another PCP. Please monitor for new or worsening signs or symptoms, return if any present.

## 2015-01-13 NOTE — ED Notes (Signed)
Pt c/o hypertension and headache x unknown amount of time.  Pain score 10/10.  Pt has not taken medication x 6 months.  Pt reports that she recently started a new job that has insurance benefits.

## 2015-03-15 ENCOUNTER — Ambulatory Visit (INDEPENDENT_AMBULATORY_CARE_PROVIDER_SITE_OTHER): Payer: BLUE CROSS/BLUE SHIELD | Admitting: Obstetrics & Gynecology

## 2015-03-15 ENCOUNTER — Encounter: Payer: Self-pay | Admitting: Obstetrics & Gynecology

## 2015-03-15 ENCOUNTER — Other Ambulatory Visit (HOSPITAL_COMMUNITY)
Admission: RE | Admit: 2015-03-15 | Discharge: 2015-03-15 | Disposition: A | Discharge status: Home / Self Care | Payer: Medicaid Other | Source: Ambulatory Visit | Attending: Obstetrics & Gynecology | Admitting: Obstetrics & Gynecology

## 2015-03-15 VITALS — BP 130/80 | HR 76 | Ht 64.2 in | Wt 175.0 lb

## 2015-03-15 DIAGNOSIS — Z01411 Encounter for gynecological examination (general) (routine) with abnormal findings: Secondary | ICD-10-CM | POA: Diagnosis present

## 2015-03-15 DIAGNOSIS — N946 Dysmenorrhea, unspecified: Secondary | ICD-10-CM

## 2015-03-15 DIAGNOSIS — Z01419 Encounter for gynecological examination (general) (routine) without abnormal findings: Secondary | ICD-10-CM

## 2015-03-15 MED ORDER — KETOROLAC TROMETHAMINE 10 MG PO TABS: 10.0000 mg | ORAL_TABLET | Freq: Three times a day (TID) | ORAL | Status: DC | PRN

## 2015-03-15 MED ORDER — NORGESTIM-ETH ESTRAD TRIPHASIC 0.18/0.215/0.25 MG-35 MCG PO TABS: 1.0000 | ORAL_TABLET | Freq: Every day | ORAL | Status: DC

## 2015-03-15 MED ORDER — HYDROCHLOROTHIAZIDE 25 MG PO TABS: 12.5000 mg | ORAL_TABLET | Freq: Every day | ORAL | Status: DC

## 2015-03-15 MED ORDER — AMLODIPINE BESYLATE 10 MG PO TABS: 10.0000 mg | ORAL_TABLET | Freq: Every day | ORAL | Status: DC

## 2015-03-15 NOTE — Progress Notes (Signed)
Patient ID: Angel Morris, female   DOB: 21-Mar-1976, 39 y.o.   MRN: [id number] Subjective:     Angel Morris is a 39 y.o. female here for a routine exam.  Patient's last menstrual period was 02/16/2015. [id number] Birth Control Method:  OCP Menstrual Calendar(currently): regular  Current complaints: dysmenorrhea first 2 days of cycle bleeding is not heavier but much more painful last 7 months or so.   Current acute medical issues:  none   Recent Gynecologic History Patient's last menstrual period was 02/16/2015. 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 Overweight(278.02)   . History of recurrent UTIs   . Anemia, iron deficiency   . Onychomycosis   . Anxiety   . Proteinuria   . Hypertension   . Asthma   . Menorrhagia   . Nephrolithiasis   . Kidney stones   . Uterine fibroid   . Abnormal pap     lSIL   . Human papilloma virus   . Abnormal Pap smear   . Contraceptive management 07/28/2013  . OTLXBWIO(035.5)     Past Surgical History  Procedure Laterality Date  . Rotary cuff - left shoulder  2005  . Kidney stones  june 2000  . Dilation and curettage of uterus    . Cervical conization w/bx N/A 10/08/2013    Procedure: CONIZATION CERVIX WITH BIOPSY;  Surgeon: Florian Buff, MD;  Location: [facility];  Service: Gynecology;  Laterality: N/A;  . Holmium laser application N/A 9/74/1638    Procedure: HOLMIUM LASER APPLICATION;  Surgeon: Florian Buff, MD;  Location: [facility];  Service: Gynecology;  Laterality: N/A;    OB History    Gravida Para Term Preterm AB TAB SAB Ectopic Multiple Living   7 4   3  3   4       History   Social History  . Marital Status: Divorced    Spouse Name: N/A  . Number of Children: N/A  . Years of Education: N/A   Social History Main Topics  . Smoking status: Never Smoker   . Smokeless tobacco: Never Used  . Alcohol Use: Yes     Comment: rare  . Drug Use: No  .  Sexual Activity: Yes    Birth Control/ Protection: Pill   Other Topics Concern  . None   Social History Narrative    Family History  Problem Relation Age of Onset  . Fibromyalgia Mother   . Arthritis Mother   . Diabetes Mother   . Hypertension Mother   . Stroke Other   . Diabetes Brother      Current outpatient prescriptions:  .  amLODipine (NORVASC) 10 MG tablet, Take 1 tablet (10 mg total) by mouth daily., Disp: 30 tablet, Rfl: 0 .  Fish Oil-Cholecalciferol (FISH OIL + D3 PO), Take by mouth., Disp: , Rfl:  .  hydrochlorothiazide (HYDRODIURIL) 25 MG tablet, Take 0.5 tablets (12.5 mg total) by mouth daily., Disp: 30 tablet, Rfl: 0 .  Norgestimate-Ethinyl Estradiol Triphasic (TRI-SPRINTEC) 0.18/0.215/0.25 MG-35 MCG tablet, TAKE ONE TABLET BY MOUTH ONCE DAILY, Disp: 28 tablet, Rfl: 11 .  amoxicillin (AMOXIL) 500 MG capsule, Take 1 capsule (500 mg total) by mouth 3 (three) times daily. (Patient not taking: Reported on 01/13/2015), Disp: 21 capsule, Rfl: 0 .  HYDROcodone-acetaminophen (HYCET) 7.5-325 mg/15 ml solution, Take 15 mLs by mouth every 4 (four) hours as needed for moderate  pain or severe pain. (Patient not taking: Reported on 01/13/2015), Disp: 120 mL, Rfl: 0 .  ibuprofen (ADVIL,MOTRIN) 200 MG tablet, Take 800 mg by mouth 3 (three) times daily. For head ache, Disp: , Rfl:  .  Multiple Vitamins-Minerals (MULTIVITAMINS THER. W/MINERALS) TABS tablet, Take 1 tablet by mouth daily., Disp: , Rfl:  .  potassium chloride (K-DUR) 10 MEQ tablet, Take 1 tablet (10 mEq total) by mouth daily. (Patient not taking: Reported on 03/15/2015), Disp: 7 tablet, Rfl: 0  Review of Systems  Review of Systems  Constitutional: Negative for fever, chills, weight loss, malaise/fatigue and diaphoresis.  HENT: Negative for hearing loss, ear pain, nosebleeds, congestion, sore throat, neck pain, tinnitus and ear discharge.   Eyes: Negative for blurred vision, double vision, photophobia, pain, discharge and  redness.  Respiratory: Negative for cough, hemoptysis, sputum production, shortness of breath, wheezing and stridor.   Cardiovascular: Negative for chest pain, palpitations, orthopnea, claudication, leg swelling and PND.  Gastrointestinal: negative for abdominal pain. Negative for heartburn, nausea, vomiting, diarrhea, constipation, blood in stool and melena.  Genitourinary: Negative for dysuria, urgency, frequency, hematuria and flank pain.  Musculoskeletal: Negative for myalgias, back pain, joint pain and falls.  Skin: Negative for itching and rash.  Neurological: Negative for dizziness, tingling, tremors, sensory change, speech change, focal weakness, seizures, loss of consciousness, weakness and headaches.  Endo/Heme/Allergies: Negative for environmental allergies and polydipsia. Does not bruise/bleed easily.  Psychiatric/Behavioral: Negative for depression, suicidal ideas, hallucinations, memory loss and substance abuse. The patient is not nervous/anxious and does not have insomnia.        Objective:  Blood pressure 130/80, pulse 76, height 5' 4.2" (1.631 m), weight 175 lb (79.379 kg), last menstrual period 02/16/2015.   Physical Exam  Vitals reviewed. Constitutional: She is oriented to person, place, and time. She appears well-developed and well-nourished.  HENT:  Head: Normocephalic and atraumatic.        Right Ear: External ear normal.  Left Ear: External ear normal.  Nose: Nose normal.  Mouth/Throat: Oropharynx is clear and moist.  Eyes: Conjunctivae and EOM are normal. Pupils are equal, round, and reactive to light. Right eye exhibits no discharge. Left eye exhibits no discharge. No scleral icterus.  Neck: Normal range of motion. Neck supple. No tracheal deviation present. No thyromegaly present.  Cardiovascular: Normal rate, regular rhythm, normal heart sounds and intact distal pulses.  Exam reveals no gallop and no friction rub.   No murmur heard. Respiratory: Effort normal  and breath sounds normal. No respiratory distress. She has no wheezes. She has no rales. She exhibits no tenderness.  GI: Soft. Bowel sounds are normal. She exhibits no distension and no mass. There is no tenderness. There is no rebound and no guarding.  Genitourinary:  Breasts no masses skin changes or nipple changes bilaterally      Vulva is normal without lesions Vagina is pink moist without discharge Cervix normal in appearance and pap is done Uterus is normal size shape and contour Adnexa is negative with normal sized ovaries   Musculoskeletal: Normal range of motion. She exhibits no edema and no tenderness.  Neurological: She is alert and oriented to person, place, and time. She has normal reflexes. She displays normal reflexes. No cranial nerve deficit. She exhibits normal muscle tone. Coordination normal.  Skin: Skin is warm and dry. No rash noted. No erythema. No pallor.  Psychiatric: She has a normal mood and affect. Her behavior is normal. Judgment and thought content normal.  Assessment:    Healthy female exam.    Plan:    Contraception: OCP (estrogen/progesterone). Follow up in: 2 weeks. sonogram

## 2015-03-15 NOTE — Addendum Note (Signed)
Addended by: Florian Buff on: 03/15/2015 04:23 PM   Modules accepted: Orders

## 2015-03-17 LAB — CYTOLOGY - PAP

## 2015-03-29 ENCOUNTER — Ambulatory Visit (INDEPENDENT_AMBULATORY_CARE_PROVIDER_SITE_OTHER): Payer: BLUE CROSS/BLUE SHIELD | Admitting: Medical

## 2015-03-29 ENCOUNTER — Other Ambulatory Visit: Payer: BLUE CROSS/BLUE SHIELD

## 2015-03-29 ENCOUNTER — Encounter: Payer: Self-pay | Admitting: Medical

## 2015-03-29 VITALS — BP 124/78 | HR 68 | Ht 66.0 in | Wt 174.4 lb

## 2015-03-29 DIAGNOSIS — R809 Proteinuria, unspecified: Secondary | ICD-10-CM

## 2015-03-29 DIAGNOSIS — R51 Headache: Secondary | ICD-10-CM | POA: Diagnosis not present

## 2015-03-29 DIAGNOSIS — R519 Headache, unspecified: Secondary | ICD-10-CM | POA: Insufficient documentation

## 2015-03-29 DIAGNOSIS — G8929 Other chronic pain: Secondary | ICD-10-CM

## 2015-03-29 DIAGNOSIS — Z0000 Encounter for general adult medical examination without abnormal findings: Secondary | ICD-10-CM | POA: Diagnosis not present

## 2015-03-29 DIAGNOSIS — R9431 Abnormal electrocardiogram [ECG] [EKG]: Secondary | ICD-10-CM

## 2015-03-29 DIAGNOSIS — E876 Hypokalemia: Secondary | ICD-10-CM

## 2015-03-29 DIAGNOSIS — I10 Essential (primary) hypertension: Secondary | ICD-10-CM | POA: Insufficient documentation

## 2015-03-29 LAB — POCT URINALYSIS DIPSTICK
Bilirubin, UA: NEGATIVE
Glucose, UA: NEGATIVE
Ketones, UA: NEGATIVE
Leukocytes, UA: NEGATIVE
Nitrite, UA: NEGATIVE
Protein, UA: NEGATIVE
RBC UA: NEGATIVE
SPEC GRAV UA: 1.02
Urobilinogen, UA: NEGATIVE
pH, UA: 7

## 2015-03-29 MED ORDER — AMLODIPINE BESYLATE 10 MG PO TABS: 10.0000 mg | ORAL_TABLET | Freq: Every day | ORAL | Status: DC

## 2015-03-29 MED ORDER — HYDROCHLOROTHIAZIDE 25 MG PO TABS: 25.0000 mg | ORAL_TABLET | Freq: Every day | ORAL | Status: DC

## 2015-03-29 MED ORDER — POTASSIUM CHLORIDE ER 10 MEQ PO TBCR: 10.0000 meq | EXTENDED_RELEASE_TABLET | Freq: Every day | ORAL | Status: DC

## 2015-03-29 NOTE — Progress Notes (Addendum)
Subjective:   HPI  Angel Morris is a 39 y.o. female who presents for a complete physical.  New patient today.  Had been out of insurance for a while.  Just got new insurance, and just saw gyn who refilled her BP meds for the time being.   Preventative care: Last ophthalmology visit: this month Last dental visit: this month Last colonoscopy: never Last mammogram: few years ago Last gynecological exam: July Last EKG: prior at ED Last labs: nonfasting today, labs recently at the ED  Reviewed their medical, surgical, family, social, medication, and allergy history and updated chart as appropriate.  Past Medical History  Diagnosis Date  . Overweight(278.02)   . History of recurrent UTIs   . Anemia, iron deficiency   . Onychomycosis   . Anxiety   . Proteinuria   . Hypertension   . Menorrhagia   . Nephrolithiasis   . Kidney stones   . Uterine fibroid   . Abnormal pap     lSIL   . Human papilloma virus   . Abnormal Pap smear   . Contraceptive management 07/28/2013  . Headache(784.0)   . Asthma     childhood  . Wears contact lenses   . Hypokalemia     Past Surgical History  Procedure Laterality Date  . Rotary cuff - left shoulder  2005  . Kidney stones  june 2000  . Dilation and curettage of uterus    . Cervical conization w/bx N/A 10/08/2013    Procedure: CONIZATION CERVIX WITH BIOPSY;  Surgeon: Florian Buff, MD;  Location: [facility];  Service: Gynecology;  Laterality: N/A;  . Holmium laser application N/A 2/44/0102    Procedure: HOLMIUM LASER APPLICATION;  Surgeon: Florian Buff, MD;  Location: [facility];  Service: Gynecology;  Laterality: N/A;    History   Social History  . Marital Status: Divorced    Spouse Name: N/A  . Number of Children: N/A  . Years of Education: N/A   Occupational History  . Not on file.   Social History Main Topics  . Smoking status: Never Smoker   . Smokeless tobacco: Never Used  . Alcohol Use: Yes     Comment: rare  . Drug Use: No   . Sexual Activity: Yes    Birth Control/ Protection: Pill   Other Topics Concern  . Not on file   Social History Narrative   Lives with boyfriend x 3 years, 4 children, 3 live with her.   Works at [employer], works in Academic librarian.  Exercise - limited.   As of 03/2015    Family History  Problem Relation Age of Onset  . Fibromyalgia Mother   . Arthritis Mother   . Diabetes Mother   . Hypertension Mother   . Stroke Other   . Diabetes Brother   . Diabetes Father   . Stroke Maternal Grandmother   . Aneurysm Maternal Grandfather      Current outpatient prescriptions:  .  amLODipine (NORVASC) 10 MG tablet, Take 1 tablet (10 mg total) by mouth daily., Disp: 90 tablet, Rfl: 3 .  hydrochlorothiazide (HYDRODIURIL) 25 MG tablet, Take 1 tablet (25 mg total) by mouth daily., Disp: 90 tablet, Rfl: 3 .  ketorolac (TORADOL) 10 MG tablet, Take 1 tablet (10 mg total) by mouth every 8 (eight) hours as needed., Disp: 15 tablet, Rfl: 0 .  Norgestimate-Ethinyl Estradiol Triphasic (TRI-SPRINTEC) 0.18/0.215/0.25 MG-35 MCG tablet, Take 1 tablet by mouth daily., Disp: 28 tablet,  Rfl: 11 .  potassium chloride (K-DUR) 10 MEQ tablet, Take 1 tablet (10 mEq total) by mouth daily., Disp: 90 tablet, Rfl: 3 .  Fish Oil-Cholecalciferol (FISH OIL + D3 PO), Take by mouth., Disp: , Rfl:  .  Multiple Vitamins-Minerals (MULTIVITAMINS THER. W/MINERALS) TABS tablet, Take 1 tablet by mouth daily., Disp: , Rfl:   No Known Allergies   Review of Systems Constitutional: -fever, -chills, -sweats, -unexpected weight change, -decreased appetite, -fatigue Allergy: -sneezing, -itching, -congestion Dermatology: -changing moles, --rash, -lumps ENT: -runny nose, -ear pain, -sore throat, -hoarseness, -sinus pain, -teeth pain, - ringing in ears, -hearing loss, -nosebleeds Cardiology: -chest pain, -palpitations, -swelling, -difficulty breathing when lying flat, -waking up short of breath Respiratory: -cough, -shortness  of breath, -difficulty breathing with exercise or exertion, -wheezing, -coughing up blood Gastroenterology: -abdominal pain, -nausea, -vomiting, -diarrhea, -constipation, -blood in stool, -changes in bowel movement, -difficulty swallowing or eating Hematology: -bleeding, -bruising  Musculoskeletal: -joint aches, -muscle aches, -joint swelling, -back pain, -neck pain, -cramping, -changes in gait Ophthalmology: denies vision changes, eye redness, itching, discharge Urology: -burning with urination, -difficulty urinating, -blood in urine, -urinary frequency, -urgency, -incontinence Neurology: -headache, -weakness, -tingling, -numbness, -memory loss, -falls, -dizziness Psychology: -depressed mood, -agitation, -sleep problems     Objective:   Physical Exam  BP 124/78 mmHg  Pulse 68  Ht 5\' 6"  (1.676 m)  Wt 174 lb 6.4 oz (79.107 kg)  BMI 28.16 kg/m2  LMP 02/16/2015  General appearance: alert, no distress, WD/WN, AA female Skin: scattered macules, no worrisome lesions HEENT: normocephalic, conjunctiva/corneas normal, sclerae anicteric, PERRLA, EOMi, nares patent, no discharge or erythema, pharynx normal Oral cavity: MMM, tongue normal, teeth - left posterior inferior molar with decay, otherwise in good repair Neck: supple, no lymphadenopathy, no thyromegaly, no masses, normal ROM, no bruits Chest: non tender, normal shape and expansion Heart: RRR, normal S1, S2, no murmurs Lungs: CTA bilaterally, no wheezes, rhonchi, or rales Abdomen: +bs, soft, non tender, non distended, no masses, no hepatomegaly, no splenomegaly, no bruits Back: non tender, normal ROM, no scoliosis Musculoskeletal: left shoulder surgical scar, otherwise upper extremities non tender, no obvious deformity, normal ROM throughout, lower extremities non tender, no obvious deformity, normal ROM throughout Extremities: no edema, no cyanosis, no clubbing Pulses: 2+ symmetric, upper and lower extremities, normal cap  refill Neurological: alert, oriented x 3, CN2-12 intact, strength normal upper extremities and lower extremities, sensation normal throughout, DTRs 2+ throughout, no cerebellar signs, gait normal Psychiatric: normal affect, behavior normal, pleasant  Breast/gyn/rectal - deferred to gyn   Adult ECG Report  Indication: Hypertension, physical  Rate: 59 bpm  Rhythm: sinus bradycardia  QRS Axis: -16 degrees  PR Interval: 141ms  QRS Duration: 17ms  QTc: 441ms  Conduction Disturbances: prolonged QT  Other Abnormalities: T wave inversions III, precordial leads  Patient's cardiac risk factors are: hypertension.  EKG comparison: 01/14/15  Narrative Interpretation: prolonged QT, nonspecific T wave abnormality     Assessment and Plan :    Encounter Diagnoses  Name Primary?  . Routine general medical examination at a health care facility   . Essential hypertension   . Hypokalemia   . Nonspecific abnormal electrocardiogram (ECG) (EKG)   . Proteinuria Yes  . Chronic nonintractable headache, unspecified headache type     Physical exam - discussed healthy lifestyle, diet, exercise, preventative care, vaccinations, and addressed their concerns.  Handout given. See your eye doctor yearly for routine vision care. See your dentist yearly for routine dental care including hygiene visits twice yearly.  See your gynecologist yearly for routine gynecological care. EKG abnormal - consider cardiology eval HTN - discussed importance of compliance, not running out of medication.  C/t current medications Proteinuria -pending labs Headaches - improved back on BP medication Return for fasting labs

## 2015-03-30 ENCOUNTER — Other Ambulatory Visit: Payer: BLUE CROSS/BLUE SHIELD

## 2015-03-30 ENCOUNTER — Other Ambulatory Visit: Payer: Self-pay | Admitting: Family Medicine

## 2015-03-30 ENCOUNTER — Telehealth: Payer: Self-pay | Admitting: Medical

## 2015-03-30 DIAGNOSIS — R9431 Abnormal electrocardiogram [ECG] [EKG]: Secondary | ICD-10-CM

## 2015-03-30 DIAGNOSIS — Z0000 Encounter for general adult medical examination without abnormal findings: Secondary | ICD-10-CM

## 2015-03-30 DIAGNOSIS — E876 Hypokalemia: Secondary | ICD-10-CM

## 2015-03-30 DIAGNOSIS — I10 Essential (primary) hypertension: Secondary | ICD-10-CM

## 2015-03-30 LAB — COMPREHENSIVE METABOLIC PANEL
ALBUMIN: 3.6 g/dL (ref 3.6–5.1)
ALT: 11 U/L (ref 6–29)
AST: 15 U/L (ref 10–30)
Alkaline Phosphatase: 50 U/L (ref 33–115)
BILIRUBIN TOTAL: 0.6 mg/dL (ref 0.2–1.2)
BUN: 14 mg/dL (ref 7–25)
CALCIUM: 8.7 mg/dL (ref 8.6–10.2)
CO2: 26 mmol/L (ref 20–31)
CREATININE: 0.78 mg/dL (ref 0.50–1.10)
Chloride: 102 mmol/L (ref 98–110)
GLUCOSE: 90 mg/dL (ref 65–99)
POTASSIUM: 3.3 mmol/L — AB (ref 3.5–5.3)
Sodium: 139 mmol/L (ref 135–146)
TOTAL PROTEIN: 6.5 g/dL (ref 6.1–8.1)

## 2015-03-30 LAB — LIPID PANEL
Cholesterol: 141 mg/dL (ref 125–200)
HDL: 76 mg/dL (ref >=46)
LDL Cholesterol: 50 mg/dL (ref <130)
TRIGLYCERIDES: 77 mg/dL (ref <150)
Total CHOL/HDL Ratio: 1.9 Ratio (ref <=5.0)
VLDL: 15 mg/dL (ref <30)

## 2015-03-30 LAB — TSH: TSH: 0.834 u[IU]/mL (ref 0.350–4.500)

## 2015-03-30 NOTE — Telephone Encounter (Signed)
Orders are in EPIC for the referral to cardioloygy and they will contact her for her appointment

## 2015-03-30 NOTE — Telephone Encounter (Signed)
Return for fasting labs as planned.  I want to refer to cardiology for abnormal EKG.   There is some electrical changes noted that needs evaluation by cardiologist.   No rush, just first available.   C/t current BP meds and return for fasting labs.  I have her biometric form for work pending lab results.

## 2015-03-31 LAB — MICROALBUMIN / CREATININE URINE RATIO
CREATININE, URINE: 206.5 mg/dL
MICROALB UR: 1 mg/dL (ref <2.0)
MICROALB/CREAT RATIO: 4.8 mg/g (ref 0.0–30.0)

## 2015-04-08 ENCOUNTER — Encounter: Payer: Self-pay | Admitting: Obstetrics & Gynecology

## 2015-04-08 ENCOUNTER — Ambulatory Visit (INDEPENDENT_AMBULATORY_CARE_PROVIDER_SITE_OTHER): Payer: BLUE CROSS/BLUE SHIELD

## 2015-04-08 ENCOUNTER — Ambulatory Visit (INDEPENDENT_AMBULATORY_CARE_PROVIDER_SITE_OTHER): Payer: BLUE CROSS/BLUE SHIELD | Admitting: Obstetrics & Gynecology

## 2015-04-08 VITALS — BP 120/80 | HR 80 | Wt 173.0 lb

## 2015-04-08 DIAGNOSIS — N946 Dysmenorrhea, unspecified: Secondary | ICD-10-CM

## 2015-04-08 MED ORDER — KETOROLAC TROMETHAMINE 10 MG PO TABS: 10.0000 mg | ORAL_TABLET | Freq: Three times a day (TID) | ORAL | Status: DC | PRN

## 2015-04-08 NOTE — Progress Notes (Signed)
US PELVIS TA/TV: retroverted uterus, 1.1 x .8 x .8cm intramural fibroid post bdy,EEC 3.21mm,normal ov's bilat (mobile),no free fluid seen,no pain during ultrasound

## 2015-04-08 NOTE — Progress Notes (Signed)
Patient ID: Angel Morris, female   DOB: 1976-07-08, 39 y.o.   MRN: [id number] US Transvaginal Non-ob  04/08/2015   GYNECOLOGIC SONOGRAM   SHANAIA SIEVERS is a 40 y.o. [id number] LMP 03/16/2015. for a pelvic sonogram  for dysmenorrhea.  Uterus                      7.21 x 5.5 x 5 cm, retroverted uterus w a 1.1  x .8 x .8cm post intramural fibroid  Endometrium          3.4 mm, symmetrical,   Right ovary             2.6 x 1.7 x 2.1 cm, wnl  Left ovary                2.4 x 2.1 x 1.1 cm, wnl    Technician Comments:  US PELVIS TA/TV: retroverted uterus, 1.1 x .8 x .8cm intramural fibroid  post bdy,EEC 3.20mm,normal ov's bilat (mobile),no free fluid seen,no pain  during ultrasound     [employer] 04/08/2015 10:54 AM  Clinical Impression and recommendations:  I have reviewed the sonogram results above, combined with the patient's  current clinical course, below are my impressions and any appropriate  recommendations for management based on the sonographic findings.  Normal uterus with small clinically insignificant myoma Endometrium normal Ovaries normal  No anatomical etiology for her dysmenorrhea is seen  [name] 04/08/2015 11:13 AM    US Pelvis Complete  04/08/2015   GYNECOLOGIC SONOGRAM   [name] is a 39 y.o. [id number] LMP 03/16/2015. for a pelvic sonogram  for dysmenorrhea.  Uterus                      7.21 x 5.5 x 5 cm, retroverted uterus w a 1.1  x .8 x .8cm post intramural fibroid  Endometrium          3.4 mm, symmetrical,   Right ovary             2.6 x 1.7 x 2.1 cm, wnl  Left ovary                2.4 x 2.1 x 1.1 cm, wnl    Technician Comments:  US PELVIS TA/TV: retroverted uterus, 1.1 x .8 x .8cm intramural fibroid  post bdy,EEC 3.50mm,normal ov's bilat (mobile),no free fluid seen,no pain  during ultrasound     [employer] 04/08/2015 10:54 AM  Clinical Impression and recommendations:  I have reviewed the sonogram results above, combined with the patient's  current clinical course, below are my  impressions and any appropriate  recommendations for management based on the sonographic findings.  Normal uterus with small clinically insignificant myoma Endometrium normal Ovaries normal  No anatomical etiology for her dysmenorrhea is seen  [name] 04/08/2015 11:13 AM     Result of sonogram were discussed  Her dysmenorrhea has gone from a "10" to a "2-3" on the pre menses toradol, will continue  Recommend trying continuous ocp with withdrawal every 3 months  Discussed endo ablation for the future if she decides to go that direction, ball in her court     Face to face time:  15 minutes  Greater than 50% of the visit time was spent in counseling and coordination of care with the patient.  The summary and outline of the counseling and care coordination is summarized in the note above.  All questions were answered.

## 2015-05-05 ENCOUNTER — Encounter: Payer: Self-pay | Admitting: Internal Medicine

## 2015-05-05 ENCOUNTER — Ambulatory Visit (INDEPENDENT_AMBULATORY_CARE_PROVIDER_SITE_OTHER): Payer: BLUE CROSS/BLUE SHIELD | Admitting: Internal Medicine

## 2015-05-05 VITALS — BP 132/86 | HR 50 | Ht 65.0 in | Wt 173.2 lb

## 2015-05-05 DIAGNOSIS — R9431 Abnormal electrocardiogram [ECG] [EKG]: Secondary | ICD-10-CM | POA: Insufficient documentation

## 2015-05-05 DIAGNOSIS — I10 Essential (primary) hypertension: Secondary | ICD-10-CM

## 2015-05-05 DIAGNOSIS — E876 Hypokalemia: Secondary | ICD-10-CM | POA: Diagnosis not present

## 2015-05-05 DIAGNOSIS — I4581 Long QT syndrome: Secondary | ICD-10-CM

## 2015-05-05 MED ORDER — SPIRONOLACTONE 25 MG PO TABS: 25.0000 mg | ORAL_TABLET | Freq: Every day | ORAL | Status: DC

## 2015-05-05 NOTE — Progress Notes (Signed)
Electrophysiology Office Note   Date:  05/05/2015   ID:  JARROD BODKINS, DOB [birth year], MRN [id number]  PCP:  Crisoforo Oxford, PA-C  Cardiologist:   Primary Electrophysiologist: Dr. Rayann Heman    Chief Complaint  Patient presents with  . Nonspecific EKG abnormalities     History of Present Illness: Angel Morris is a 39 y.o. female who presents today for electrophysiology evaluation secondary to an abnormal EKG. EKG at that time revealed an anteroseptal infarct with prolonged QT.  She had presented with uncontrolled HTN and was also noted to have hypokalemia.  Upon recent follow-up with primary care, her anteroseptal infarct pattern was gone but prolonged QT was again noted.  She is therefore referred for cardiology evaluation. She is clinically asymptomatic.  She denies exertional CP or SOB but is not very active.  She denies FH of long QT or sudden death. She reports a single syncopal episode a couple years ago after a cone biopsy and heavy vaginal bleeding.  Outside of this, no other syncope, no dizzy spells, no near syncope.     Past Medical History  Diagnosis Date  . Overweight(278.02)   . History of recurrent UTIs   . Anemia, iron deficiency   . Onychomycosis   . Anxiety   . Proteinuria   . Hypertension   . Menorrhagia   . Nephrolithiasis   . Kidney stones   . Uterine fibroid   . Abnormal pap     lSIL   . Human papilloma virus   . Abnormal Pap smear   . Contraceptive management 07/28/2013  . Headache(784.0)   . Asthma     childhood  . Wears contact lenses   . Hypokalemia     being addressed with her PMD  . Abnormal EKG     long QT   Past Surgical History  Procedure Laterality Date  . Rotary cuff - left shoulder  2005  . Kidney stones  june 2000  . Dilation and curettage of uterus    . Cervical conization w/bx N/A 10/08/2013    Procedure: CONIZATION CERVIX WITH BIOPSY;  Surgeon: Florian Buff, MD;  Location: [facility];  Service: Gynecology;  Laterality:  N/A;  . Holmium laser application N/A 11/28/4740    Procedure: HOLMIUM LASER APPLICATION;  Surgeon: Florian Buff, MD;  Location: [facility];  Service: Gynecology;  Laterality: N/A;     Current Outpatient Prescriptions  Medication Sig Dispense Refill  . amLODipine (NORVASC) 10 MG tablet Take 1 tablet (10 mg total) by mouth daily. 90 tablet 3  . ketorolac (TORADOL) 10 MG tablet Take 10 mg by mouth every 8 (eight) hours as needed (pain).    . Norgestimate-Ethinyl Estradiol Triphasic (TRI-SPRINTEC) 0.18/0.215/0.25 MG-35 MCG tablet Take 1 tablet by mouth daily. 28 tablet 11  . potassium chloride (K-DUR) 10 MEQ tablet Take 20 mEq by mouth daily.    Marland Kitchen spironolactone (ALDACTONE) 25 MG tablet Take 1 tablet (25 mg total) by mouth daily. 90 tablet 3  . Multiple Vitamins-Minerals (MULTIVITAMINS THER. W/MINERALS) TABS tablet Take 1 tablet by mouth daily.     No current facility-administered medications for this visit.    Allergies:   Review of patient's allergies indicates no known allergies.   Social History:  The patient  reports that she has never smoked. She has never used smokeless tobacco. She reports that she drinks alcohol. She reports that she does not use illicit drugs.   Family History:  The patient's family history includes  Aneurysm in her maternal grandfather; Arthritis in her mother; Diabetes in her brother, father, and mother; Fibromyalgia in her mother; Hypertension in her mother; Stroke in her maternal grandmother and other. Again, no family history of sudden death or Long QT syndrome.   ROS:  Please see the history of present illness.   All other systems are reviewed and negative.    PHYSICAL EXAM: VS:  BP 132/86 mmHg  Pulse 50  Ht 5\' 5"  (1.651 m)  Wt 78.563 kg (173 lb 3.2 oz)  BMI 28.82 kg/m2  LMP 04/14/2015 , BMI Body mass index is 28.82 kg/(m^2). GEN: Well nourished, well developed, in no acute distress HEENT: normal Neck: no JVD, carotid bruits, or masses Cardiac: RRR; no  murmurs, rubs, or gallops,no edema  Respiratory:  clear to auscultation bilaterally, normal work of breathing GI: soft, nontender, nondistended, + BS MS: no deformity or atrophy Skin: warm and dry  Neuro:  Strength and sensation are intact Psych: euthymic mood, full affect  EKG:  EKG is ordered today. The ekg ordered today shows SB, nonspecific T changes   Recent Labs: 01/13/2015: Hemoglobin 14.0; Platelets 198 03/30/2015: ALT 11; BUN 14; Creat 0.78; Potassium 3.3*; Sodium 139; TSH 0.834    Lipid Panel     Component Value Date/Time   CHOL 141 03/30/2015 0738   TRIG 77 03/30/2015 0738   HDL 76 03/30/2015 0738   CHOLHDL 1.9 03/30/2015 0738   VLDL 15 03/30/2015 0738   LDLCALC 50 03/30/2015 0738     Wt Readings from Last 3 Encounters:  05/05/15 78.563 kg (173 lb 3.2 oz)  04/08/15 78.472 kg (173 lb)  03/29/15 79.107 kg (174 lb 6.4 oz)      Other studies Reviewed: Additional studies/ records that were reviewed today include: EKG from May with changes of anterior infarct likely secondary to lead placement and August with prolonged QT   ASSESSMENT AND PLAN:  1. Abnormal EKG      Anteroseptal infarct pattern was likely due to lead position.  No ischemic symptoms.  Given h/o elevated BP and sedentary lifestyle, will obtain plain GXT to evaluate for ischemia.  If low risk, regular exercise is encouraged 2. HTN     Poorly controlled over the years without insurance and medications previously.  Now improved, though with low K on hctz.     Will obtain an echo to evaluate for hypertensive cardiovascular disease.       I have discussed with PCP.  Will stop hctz and start spironolactone 25mg  daily. 3. Hypokalemia     As above     Hopefully can stop supplementation once hctz is washed out and K is replete     Pt instructed to follow-up with PCP in 1-2 weeks for repeat BMET 4. Prolonged QT     Asymptomatic     No FH of prolonged QT     Would keep Mg>1.9 and K>3.9     She should  return if she has any syncopal symptoms     Avoid QT prolonging drugs going forward   Follow-up:  PMD in 2 weeks to check her BP with medicine changes and check BMet panel, this was discussed with the pt as well as her primary care provider via telephone  Current medicines are reviewed at length with the patient today.   The patient does not have concerns regarding her medicines.  The following changes were made today:  Stop HCTZ, start Spironolactone 25mg  daily, cotinue other meds unchanged. If echo and  stress test are low risk results we will see her PRN and she will continue with her PMD.  Labs/ tests ordered today include:  Orders Placed This Encounter  Procedures  . Exercise Tolerance Test  . EKG 12-Lead  . ECHOCARDIOGRAM COMPLETE     Signed, Thompson Grayer, MD  05/05/2015 10:55 PM     [address] [address] [phone] (office) [phone] (fax)

## 2015-05-05 NOTE — Patient Instructions (Addendum)
Medication Instructions:  Your physician has recommended you make the following change in your medication:  1) Stop HCTZ 2) Start Spironolactone 25 mg daily   Labwork: Your physician recommends that you return for lab work in 2 weeks with PCP   Testing/Procedures: Your physician has requested that you have an echocardiogram. Echocardiography is a painless test that uses sound waves to create images of your heart. It provides your doctor with information about the size and shape of your heart and how well your heart's chambers and valves are working. This procedure takes approximately one hour. There are no restrictions for this procedure.  Your physician has requested that you have an exercise tolerance test. For further information please visit [email or web address]. Please also follow instruction sheet, as given.   Follow-Up: Your physician recommends that you schedule a follow-up appointment in: a few weeks with PCP and as needed with Dr Rayann Heman   Any Other Special Instructions Will Be Listed Below (If Applicable).

## 2015-05-10 ENCOUNTER — Telehealth: Payer: Self-pay | Admitting: Obstetrics & Gynecology

## 2015-05-10 NOTE — Telephone Encounter (Signed)
Pt states would like to proceed with Endometrial ablation.   Also, pt c/o vaginal discharge with odor, Hx of BV. Requesting RX.

## 2015-05-11 ENCOUNTER — Telehealth: Payer: Self-pay | Admitting: Obstetrics & Gynecology

## 2015-05-11 ENCOUNTER — Other Ambulatory Visit: Payer: Self-pay

## 2015-05-11 ENCOUNTER — Ambulatory Visit (HOSPITAL_COMMUNITY): Payer: BLUE CROSS/BLUE SHIELD | Attending: Internal Medicine

## 2015-05-11 DIAGNOSIS — I10 Essential (primary) hypertension: Secondary | ICD-10-CM | POA: Insufficient documentation

## 2015-05-11 DIAGNOSIS — I340 Nonrheumatic mitral (valve) insufficiency: Secondary | ICD-10-CM | POA: Diagnosis not present

## 2015-05-11 DIAGNOSIS — I4581 Long QT syndrome: Secondary | ICD-10-CM | POA: Insufficient documentation

## 2015-05-11 DIAGNOSIS — I371 Nonrheumatic pulmonary valve insufficiency: Secondary | ICD-10-CM | POA: Insufficient documentation

## 2015-05-11 DIAGNOSIS — I071 Rheumatic tricuspid insufficiency: Secondary | ICD-10-CM | POA: Insufficient documentation

## 2015-05-11 DIAGNOSIS — I5189 Other ill-defined heart diseases: Secondary | ICD-10-CM | POA: Diagnosis not present

## 2015-05-11 MED ORDER — METRONIDAZOLE 0.75 % VA GEL
VAGINAL | Status: DC
Start: 2015-05-11 — End: 2015-07-02

## 2015-05-11 NOTE — Telephone Encounter (Signed)
Pt informed [employer] e-scribed to [facility]. Amy Drema [place] to contact pt concerning surgery schedule. Pt verbalized understanding.

## 2015-05-14 ENCOUNTER — Telehealth: Payer: Self-pay | Admitting: Internal Medicine

## 2015-05-14 NOTE — Telephone Encounter (Signed)
New message     Pt c/o medication issue:  1. Name of Medication: spironolactone  2. How are you currently taking this medication (dosage and times per day)? 25mg  1 time a day  3. Are you having a reaction (difficulty breathing--STAT)? No  4. What is your medication issue? Pt states she is getting cramps all over her body during the day and at night she gets cramps in legs

## 2015-05-14 NOTE — Telephone Encounter (Signed)
Spoke with patient and have advised her to increase her Potassium intake for the weekend and if she is still having these problems next week will need to obtain BMP if still having cramping

## 2015-05-21 ENCOUNTER — Encounter: Payer: Self-pay | Admitting: Medical

## 2015-05-21 ENCOUNTER — Ambulatory Visit (INDEPENDENT_AMBULATORY_CARE_PROVIDER_SITE_OTHER): Payer: BLUE CROSS/BLUE SHIELD | Admitting: Medical

## 2015-05-21 VITALS — BP 110/70 | HR 80 | Resp 12 | Ht 66.0 in | Wt 171.4 lb

## 2015-05-21 DIAGNOSIS — R202 Paresthesia of skin: Secondary | ICD-10-CM

## 2015-05-21 DIAGNOSIS — R252 Cramp and spasm: Secondary | ICD-10-CM

## 2015-05-21 DIAGNOSIS — I10 Essential (primary) hypertension: Secondary | ICD-10-CM

## 2015-05-21 DIAGNOSIS — E876 Hypokalemia: Secondary | ICD-10-CM | POA: Diagnosis not present

## 2015-05-21 LAB — BASIC METABOLIC PANEL
BUN: 10 mg/dL (ref 7–25)
CALCIUM: 8.6 mg/dL (ref 8.6–10.2)
CHLORIDE: 104 mmol/L (ref 98–110)
CO2: 25 mmol/L (ref 20–31)
CREATININE: 0.71 mg/dL (ref 0.50–1.10)
Glucose, Bld: 89 mg/dL (ref 65–99)
Potassium: 4.4 mmol/L (ref 3.5–5.3)
Sodium: 136 mmol/L (ref 135–146)

## 2015-05-21 LAB — MAGNESIUM: Magnesium: 1.8 mg/dL (ref 1.5–2.5)

## 2015-05-21 MED ORDER — TRIAMCINOLONE ACETONIDE 0.1 % EX CREA: 1.0000 "application " | TOPICAL_CREAM | Freq: Two times a day (BID) | CUTANEOUS | Status: DC

## 2015-05-21 NOTE — Progress Notes (Signed)
Subjective: Here for recheck.  After her recent physical I sent to cardiology for long hx/o uncontrolled hypertension and abnormal EKG.  She has stress test planned, and had echo.  HCTZ was stopped.  She continues on amlodipine, Spironolactone was added, and potassium was cut in half with plan to stop potassium if labs back to normal.  She has been having some muscle cramps though this past week or 2.   Taking OTC potassium.  She also reports irritated skin/rash on both arms.  She denies allergy problems, uses lotion sometimes, no new exposures other than using bug repellant on arms about 2 weeks ago, has had itchy rash 1 wk.   She notes left wrist and hand tingling from time to time. Has pain in her left arm last week, but now jut intermittent tinging.  She is left handed.  Works loading and unloading boxes at work, repetitive motion.   No other aggravating or relieving factors. No other complaint.  Past Medical History  Diagnosis Date  . Overweight(278.02)   . History of recurrent UTIs   . Anemia, iron deficiency   . Onychomycosis   . Anxiety   . Proteinuria   . Hypertension   . Menorrhagia   . Nephrolithiasis   . Kidney stones   . Uterine fibroid   . Abnormal pap     lSIL   . Human papilloma virus   . Abnormal Pap smear   . Contraceptive management 07/28/2013  . Headache(784.0)   . Asthma     childhood  . Wears contact lenses   . Hypokalemia     being addressed with her PMD  . Abnormal EKG     long QT   ROS as in subjective  Objective: BP 110/70 mmHg  Pulse 80  Resp 12  Ht 5\' 6"  (1.676 m)  Wt 171 lb 6.4 oz (77.747 kg)  BMI 27.68 kg/m2  LMP 04/14/2015  Gen: wd, wn, nad Heart RRR, normal s1, s2, no murmurs Lungs clear Neuro: -phalens and tinels, normal arm strength, sensation Pulses normal   assessment  Encounter Diagnoses  Name Primary?  . Essential hypertension Yes  . Hypokalemia   . Muscle cramps   . Paresthesias in left hand     Plan: HTN,  hypokalemia - BMET and Mg lab today.  reviewed Dr. Jackalyn Lombard recent visit notes /cardiology.  I am on vacation next week, so advised she look at My Chart results and hopefully sometime from our office will call.  If BMET normal or high, then she is to stop potassium.    If still low, could theoretically continue potassium to keep >3.9.  Also, goal magnesium is to be >1.9.  C/t amlodipine, spironolactone  Cramps - likely related to change in medication recently.  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   discussed possible diagnosis, treatment, and begin QHS OTC wrist splints.   F/u pending labs.

## 2015-05-24 ENCOUNTER — Telehealth: Payer: Self-pay | Admitting: *Deleted

## 2015-05-24 NOTE — Telephone Encounter (Signed)
-----   Message from Rita Ohara, MD sent at 05/23/2015  4:09 PM EDT ----- Angel Morris-- This is not in my in-basket, so I can't release to MyChart or advise her of results that way.  Please advise that potassium was normal, and as per Shane's last note, she should STOP the potassium supplement.  Her magnesium level was normal at 1.8.  [name] mentioned goal of >1.9 in his note.  She isn't on PPI which could lower the level, and there are no prior Mg levels for comparison.  Probably fine; if having a lot of muscle cramps, could take a magnesium supplement.  Thanks  ----- Message -----    From: Carlena Hurl, PA-C    Sent: 05/21/2015   4:23 PM      To: Denita Lung, MD, Rita Ohara, MD  pls look out for BMET and Mg lab.  See my plan notes for next steps regarding potassium.    Thanks

## 2015-05-24 NOTE — Telephone Encounter (Signed)
Patient advised.

## 2015-05-28 ENCOUNTER — Telehealth (HOSPITAL_COMMUNITY): Payer: Self-pay

## 2015-05-28 NOTE — Telephone Encounter (Signed)
Encounter complete. 

## 2015-06-02 ENCOUNTER — Ambulatory Visit (HOSPITAL_COMMUNITY)
Admission: RE | Admit: 2015-06-02 | Discharge: 2015-06-02 | Disposition: A | Discharge status: Home / Self Care | Payer: BLUE CROSS/BLUE SHIELD | Source: Ambulatory Visit | Attending: Cardiovascular Disease | Admitting: Cardiovascular Disease

## 2015-06-02 DIAGNOSIS — I4581 Long QT syndrome: Secondary | ICD-10-CM

## 2015-06-02 DIAGNOSIS — R9439 Abnormal result of other cardiovascular function study: Secondary | ICD-10-CM | POA: Insufficient documentation

## 2015-06-03 LAB — EXERCISE TOLERANCE TEST
CSEPED: 9 min
CSEPEW: 10.9 METS
CSEPPHR: 146 {beats}/min
Exercise duration (sec): 33 s
MPHR: 181 {beats}/min
Percent HR: 80 %
RPE: 16
Rest HR: 50 {beats}/min

## 2015-06-14 ENCOUNTER — Encounter: Payer: Self-pay | Admitting: Obstetrics & Gynecology

## 2015-06-14 ENCOUNTER — Ambulatory Visit (INDEPENDENT_AMBULATORY_CARE_PROVIDER_SITE_OTHER): Payer: BLUE CROSS/BLUE SHIELD | Admitting: Obstetrics & Gynecology

## 2015-06-14 VITALS — BP 120/70 | HR 68 | Wt 173.0 lb

## 2015-06-14 DIAGNOSIS — N946 Dysmenorrhea, unspecified: Secondary | ICD-10-CM

## 2015-06-14 NOTE — Progress Notes (Signed)
Patient ID: [name], female   DOB: 11-19-1975, 39 y.o.   MRN: [id number] Preoperative History and Physical  Angel Morris is a 39 y.o. [id number] with Patient's last menstrual period was 06/11/2015. admitted for a hysteroscopy D&C and NovaSure endometrial ablation.  This is the attachment from her last note US Transvaginal Non-ob  04/08/2015 GYNECOLOGIC SONOGRAM Angel Morris is a 39 y.o. [id number] LMP 03/16/2015. for a pelvic sonogram for dysmenorrhea. Uterus 7.21 x 5.5 x 5 cm, retroverted uterus w a 1.1 x .8 x .8cm post intramural fibroid Endometrium 3.4 mm, symmetrical, Right ovary 2.6 x 1.7 x 2.1 cm, wnl Left ovary 2.4 x 2.1 x 1.1 cm, wnl Technician Comments: US PELVIS TA/TV: retroverted uterus, 1.1 x .8 x .8cm intramural fibroid post bdy,EEC 3.72mm,normal ov's bilat (mobile),no free fluid seen,no pain during ultrasound Angel Morris 04/08/2015 10:54 AM Clinical Impression and recommendations: I have reviewed the sonogram results above, combined with the patient's current clinical course, below are my impressions and any appropriate recommendations for management based on the sonographic findings. Normal uterus with small clinically insignificant myoma Endometrium normal Ovaries normal No anatomical etiology for her dysmenorrhea is seen Angel Morris H 04/08/2015 11:13 AM   US Pelvis Complete  04/08/2015 GYNECOLOGIC SONOGRAM Angel Morris is a 39 y.o. [id number] LMP 03/16/2015. for a pelvic sonogram for dysmenorrhea. Uterus 7.21 x 5.5 x 5 cm, retroverted uterus w a 1.1 x .8 x .8cm post intramural fibroid Endometrium 3.4 mm, symmetrical, Right ovary 2.6 x 1.7 x 2.1 cm, wnl Left ovary 2.4 x 2.1 x 1.1 cm, wnl Technician Comments: US PELVIS TA/TV: retroverted uterus, 1.1 x .8 x .8cm intramural fibroid post bdy,EEC 3.26mm,normal ov's bilat (mobile),no  free fluid seen,no pain during ultrasound Angel Morris 04/08/2015 10:54 AM Clinical Impression and recommendations: I have reviewed the sonogram results above, combined with the patient's current clinical course, below are my impressions and any appropriate recommendations for management based on the sonographic findings. Normal uterus with small clinically insignificant myoma Endometrium normal Ovaries normal No anatomical etiology for her dysmenorrhea is seen Angel Morris H 04/08/2015 11:13 AM    Result of sonogram were discussed  Her dysmenorrhea has gone from a "10" to a "2-3" on the pre menses toradol, will continue  Recommend trying continuous ocp with withdrawal every 3 months  Discussed endo ablation for the future if she decides to go that direction, ball in her court  She still states that her pain is back up to waited 10 despite premenstrual Toradol at this point she wants to proceed with NovaSure endometrial ablation for management     PMH:    Past Medical History  Diagnosis Date  . Overweight(278.02)   . History of recurrent UTIs   . Anemia, iron deficiency   . Onychomycosis   . Anxiety   . Proteinuria   . Hypertension   . Menorrhagia   . Nephrolithiasis   . Kidney stones   . Uterine fibroid   . Abnormal pap     lSIL   . Human papilloma virus   . Abnormal Pap smear   . Contraceptive management 07/28/2013  . Headache(784.0)   . Asthma     childhood  . Wears contact lenses   . Hypokalemia     being addressed with her PMD  . Abnormal EKG     long QT    PSH:     Past Surgical History  Procedure Laterality Date  . Rotary cuff - left shoulder  2005  . Kidney stones  june 2000  . Dilation and curettage of uterus    . Cervical conization w/bx N/A 10/08/2013    Procedure: CONIZATION CERVIX WITH BIOPSY;  Surgeon: Florian Buff, MD;  Location: [facility];  Service: Gynecology;  Laterality: N/A;  . Holmium laser application N/A 02/18/7627    Procedure:  HOLMIUM LASER APPLICATION;  Surgeon: Florian Buff, MD;  Location: [facility];  Service: Gynecology;  Laterality: N/A;    POb/GynH:      OB History    Gravida Para Term Preterm AB TAB SAB Ectopic Multiple Living   7 4   3  3   4       SH:   Social History  Substance Use Topics  . Smoking status: Never Smoker   . Smokeless tobacco: Never Used  . Alcohol Use: Yes     Comment: rare    FH:    Family History  Problem Relation Age of Onset  . Fibromyalgia Mother   . Arthritis Mother   . Diabetes Mother   . Hypertension Mother   . Stroke Other   . Diabetes Brother   . Diabetes Father   . Stroke Maternal Grandmother   . Aneurysm Maternal Grandfather      Allergies: No Known Allergies  Medications:       Current outpatient prescriptions:  .  amLODipine (NORVASC) 10 MG tablet, Take 1 tablet (10 mg total) by mouth daily., Disp: 90 tablet, Rfl: 3 .  Norgestimate-Ethinyl Estradiol Triphasic (TRI-SPRINTEC) 0.18/0.215/0.25 MG-35 MCG tablet, Take 1 tablet by mouth daily., Disp: 28 tablet, Rfl: 11 .  spironolactone (ALDACTONE) 25 MG tablet, Take 1 tablet (25 mg total) by mouth daily., Disp: 90 tablet, Rfl: 3 .  triamcinolone cream (KENALOG) 0.1 %, Apply 1 application topically 2 (two) times daily., Disp: 45 g, Rfl: 0 .  ketorolac (TORADOL) 10 MG tablet, Take 10 mg by mouth every 8 (eight) hours as needed (pain)., Disp: , Rfl:  .  metroNIDAZOLE (METROGEL VAGINAL) 0.75 % vaginal gel, Nightly x 5 nights (Patient not taking: Reported on 06/14/2015), Disp: 70 g, Rfl: 0 .  Multiple Vitamins-Minerals (MULTIVITAMINS THER. W/MINERALS) TABS tablet, Take 1 tablet by mouth daily., Disp: , Rfl:  .  potassium chloride (K-DUR) 10 MEQ tablet, Take 20 mEq by mouth daily., Disp: , Rfl:   Review of Systems:   Review of Systems  Constitutional: Negative for fever, chills, weight loss, malaise/fatigue and diaphoresis.  HENT: Negative for hearing loss, ear pain, nosebleeds, congestion, sore throat, neck pain,  tinnitus and ear discharge.   Eyes: Negative for blurred vision, double vision, photophobia, pain, discharge and redness.  Respiratory: Negative for cough, hemoptysis, sputum production, shortness of breath, wheezing and stridor.   Cardiovascular: Negative for chest pain, palpitations, orthopnea, claudication, leg swelling and PND.  Gastrointestinal: Positive for abdominal pain. Negative for heartburn, nausea, vomiting, diarrhea, constipation, blood in stool and melena.  Genitourinary: Negative for dysuria, urgency, frequency, hematuria and flank pain.  Musculoskeletal: Negative for myalgias, back pain, joint pain and falls.  Skin: Negative for itching and rash.  Neurological: Negative for dizziness, tingling, tremors, sensory change, speech change, focal weakness, seizures, loss of consciousness, weakness and headaches.  Endo/Heme/Allergies: Negative for environmental allergies and polydipsia. Does not bruise/bleed easily.  Psychiatric/Behavioral: Negative for depression, suicidal ideas, hallucinations, memory loss and substance abuse. The patient is not nervous/anxious and does not have insomnia.      PHYSICAL EXAM:  Blood pressure 120/70, pulse 68, weight 173  lb (78.472 kg), last menstrual period 06/11/2015.    Vitals reviewed. Constitutional: She is oriented to person, place, and time. She appears well-developed and well-nourished.  HENT:  Head: Normocephalic and atraumatic.  Right Ear: External ear normal.  Left Ear: External ear normal.  Nose: Nose normal.  Mouth/Throat: Oropharynx is clear and moist.  Eyes: Conjunctivae and EOM are normal. Pupils are equal, round, and reactive to light. Right eye exhibits no discharge. Left eye exhibits no discharge. No scleral icterus.  Neck: Normal range of motion. Neck supple. No tracheal deviation present. No thyromegaly present.  Cardiovascular: Normal rate, regular rhythm, normal heart sounds and intact distal pulses.  Exam reveals no gallop  and no friction rub.   No murmur heard. Respiratory: Effort normal and breath sounds normal. No respiratory distress. She has no wheezes. She has no rales. She exhibits no tenderness.  GI: Soft. Bowel sounds are normal. She exhibits no distension and no mass. There is tenderness. There is no rebound and no guarding.  Genitourinary:       Vulva is normal without lesions Vagina is pink moist without discharge Cervix normal in appearance and pap is normal Uterus is normal size, contour, position, consistency, mobility, non-tender Adnexa is negative with normal sized ovaries by sonogram  Musculoskeletal: Normal range of motion. She exhibits no edema and no tenderness.  Neurological: She is alert and oriented to person, place, and time. She has normal reflexes. She displays normal reflexes. No cranial nerve deficit. She exhibits normal muscle tone. Coordination normal.  Skin: Skin is warm and dry. No rash noted. No erythema. No pallor.  Psychiatric: She has a normal mood and affect. Her behavior is normal. Judgment and thought content normal.    Labs: Results for orders placed or performed during the hospital encounter of 06/02/15 (from the past 336 hour(s))  Exercise Tolerance Test   Collection Time: 06/02/15 10:36 AM  Result Value Ref Range   Rest HR 50 bpm   Rest BP 119/74 mmHg   Exercise duration (min) 9 min   Exercise duration (sec) 33 sec   Estimated workload 10.9 METS   Peak HR 146 bpm   Peak BP 134/96 mmHg   MPHR 181 bpm   Percent HR 80 %   RPE 16     EKG: Orders placed or performed in visit on 05/05/15  . EKG 12-Lead    Imaging Studies: No results found.    Assessment: Patient Active Problem List   Diagnosis Date Noted  . Prolonged Q-T interval on ECG 05/05/2015  . Nonspecific abnormal electrocardiogram (ECG) (EKG) 03/29/2015  . Essential hypertension 03/29/2015  . Routine general medical examination at a health care facility 03/29/2015  . Hypokalemia 03/29/2015   . 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 Contraceptive management 07/28/2013    Plan: Hysteroscopy D&C endometrial ablation with NovaSure  Santiago Stenzel H 06/14/2015 12:15 PM

## 2015-06-15 ENCOUNTER — Other Ambulatory Visit: Payer: Self-pay | Admitting: Obstetrics & Gynecology

## 2015-06-21 ENCOUNTER — Encounter (HOSPITAL_COMMUNITY)
Admission: RE | Admit: 2015-06-21 | Discharge: 2015-06-21 | Disposition: A | Discharge status: Home / Self Care | Payer: BLUE CROSS/BLUE SHIELD | Source: Ambulatory Visit | Attending: Obstetrics & Gynecology | Admitting: Obstetrics & Gynecology

## 2015-06-21 ENCOUNTER — Encounter (HOSPITAL_COMMUNITY): Payer: Self-pay

## 2015-06-21 DIAGNOSIS — N946 Dysmenorrhea, unspecified: Secondary | ICD-10-CM | POA: Diagnosis not present

## 2015-06-21 DIAGNOSIS — Z01818 Encounter for other preprocedural examination: Secondary | ICD-10-CM | POA: Insufficient documentation

## 2015-06-21 DIAGNOSIS — N921 Excessive and frequent menstruation with irregular cycle: Secondary | ICD-10-CM | POA: Diagnosis not present

## 2015-06-21 HISTORY — DX: Adverse effect of unspecified anesthetic, initial encounter: T41.45XA

## 2015-06-21 HISTORY — DX: Other complications of anesthesia, initial encounter: T88.59XA

## 2015-06-21 LAB — COMPREHENSIVE METABOLIC PANEL
ALT: 14 U/L (ref 14–54)
ANION GAP: 5 (ref 5–15)
AST: 18 U/L (ref 15–41)
Albumin: 3.2 g/dL — ABNORMAL LOW (ref 3.5–5.0)
Alkaline Phosphatase: 53 U/L (ref 38–126)
BILIRUBIN TOTAL: 0.6 mg/dL (ref 0.3–1.2)
BUN: 14 mg/dL (ref 6–20)
CHLORIDE: 105 mmol/L (ref 101–111)
CO2: 27 mmol/L (ref 22–32)
Calcium: 9.2 mg/dL (ref 8.9–10.3)
Creatinine, Ser: 0.72 mg/dL (ref 0.44–1.00)
Glucose, Bld: 92 mg/dL (ref 65–99)
POTASSIUM: 4.7 mmol/L (ref 3.5–5.1)
Sodium: 137 mmol/L (ref 135–145)
TOTAL PROTEIN: 6.4 g/dL — AB (ref 6.5–8.1)

## 2015-06-21 LAB — HCG, QUANTITATIVE, PREGNANCY

## 2015-06-21 LAB — CBC
HCT: 36.9 % (ref 36.0–46.0)
Hemoglobin: 12.9 g/dL (ref 12.0–15.0)
MCH: 28 pg (ref 26.0–34.0)
MCHC: 35 g/dL (ref 30.0–36.0)
MCV: 80.2 fL (ref 78.0–100.0)
PLATELETS: 255 10*3/uL (ref 150–400)
RBC: 4.6 MIL/uL (ref 3.87–5.11)
RDW: 13.4 % (ref 11.5–15.5)
WBC: 8.5 10*3/uL (ref 4.0–10.5)

## 2015-06-21 NOTE — Patient Instructions (Signed)
Angel Morris  06/21/2015     @PREFPERIOPPHARMACY @   Your procedure is scheduled on  06/25/2015  Report to [facility] at  1130  A.M.  Call this number if you have problems the morning of surgery:  [phone]   Remember:  Do not eat food or drink liquids after midnight.  Take these medicines the morning of surgery with A SIP OF WATER  Amlodipine, toradol.   Do not wear jewelry, make-up or nail polish.  Do not wear lotions, powders, or perfumes.  You may wear deodorant.  Do not shave 48 hours prior to surgery.  Men may shave face and neck.  Do not bring valuables to the hospital.  [facility] is not responsible for any belongings or valuables.  Contacts, dentures or bridgework may not be worn into surgery.  Leave your suitcase in the car.  After surgery it may be brought to your room.  For patients admitted to the hospital, discharge time will be determined by your treatment team.  Patients discharged the day of surgery will not be allowed to drive home.   Name and phone number of your driver:   family Special instructions:  none  Please read over the following fact sheets that you were given. Pain Booklet, Coughing and Deep Breathing, Surgical Site Infection Prevention, Anesthesia Post-op Instructions and Care and Recovery After Surgery      Endometrial Ablation Endometrial ablation removes the lining of the uterus (endometrium). It is usually a same-day, outpatient treatment. Ablation helps avoid major surgery, such as surgery to remove the cervix and uterus (hysterectomy). After endometrial ablation, you will have little or no menstrual bleeding and may not be able to have children. However, if you are premenopausal, you will need to use a reliable method of birth control following the procedure because of the small chance that pregnancy can occur. There are different reasons to have this procedure. These reasons include:  Heavy periods.  Bleeding  that is causing anemia.  Irregular bleeding.  Bleeding fibroids on the lining inside the uterus if they are smaller than 3 centimeters. This procedure may not be possible for you if:   You want to have children in the future.   You have severe cramps with your menstrual period.   You have precancerous or cancerous cells in your uterus.   You were recently pregnant.   You have gone through menopause.   You have had major surgery on your uterus, resulting in thinning of the uterine wall. Surgeries may include:  The removal of one or more uterine fibroids (myomectomy).  A cesarean section with a classic (vertical) incision on your uterus. Ask your health care provider what type of cesarean you had. Sometimes the scar on your skin is different than the scar on your uterus. Even if you have had surgery on your uterus, certain types of ablation may still be safe for you. Talk with your health care provider. LET [facility] CARE PROVIDER KNOW ABOUT:  Any allergies you have.  All medicines you are taking, including vitamins, herbs, eye drops, creams, and over-the-counter medicines.  Previous problems you or members of your family have had with the use of anesthetics.  Any blood disorders you have.  Previous surgeries you have had.  Medical conditions you have. RISKS AND COMPLICATIONS  Generally, this is a safe procedure. However, as with any procedure, complications can occur. Possible complications include:  Perforation of the  uterus.  Bleeding.  Infection of the uterus, bladder, or vagina.  Injury to surrounding organs.  An air bubble to the lung (air embolus).  Pregnancy following the procedure.  Failure of the procedure to help the problem, requiring hysterectomy.  Decreased ability to diagnose cancer in the lining of the uterus. BEFORE THE PROCEDURE  The lining of the uterus must be tested to make sure there is no pre-cancerous or cancer cells present.  An  ultrasound may be performed to look at the size of the uterus and to check for abnormalities.  Medicines may be given to thin the lining of the uterus. PROCEDURE  During the procedure, your health care provider will use a tool called a resectoscope to help see inside your uterus. There are different ways to remove the lining of your uterus.   Radiofrequency - This method uses a radiofrequency-alternating electric current to remove the lining of the uterus.  Cryotherapy - This method uses extreme cold to freeze the lining of the uterus.  Heated-Free Liquid - This method uses heated salt (saline) solution to remove the lining of the uterus.  Microwave - This method uses high-energy microwaves to heat up the lining of the uterus to remove it.  Thermal balloon - This method involves inserting a catheter with a balloon tip into the uterus. The balloon tip is filled with heated fluid to remove the lining of the uterus. AFTER THE PROCEDURE  After your procedure, do not have sexual intercourse or insert anything into your vagina until permitted by your health care provider. After the procedure, you may experience:  Cramps.  Vaginal discharge.  Frequent urination.   This information is not intended to replace advice given to you by your health care provider. Make sure you discuss any questions you have with your health care provider.   Document Released: 06/23/2004 Document Revised: 05/05/2015 Document Reviewed: 01/15/2013 Elsevier Interactive Patient Education 2016 Elsevier [employer] Dilation and Curettage or Vacuum Curettage Dilation and curettage (D&C) and vacuum curettage are minor procedures. A D&C involves stretching (dilation) the cervix and scraping (curettage) the inside lining of the womb (uterus). During a D&C, tissue is gently scraped from the inside lining of the uterus. During a vacuum curettage, the lining and tissue in the uterus are removed with the use of gentle suction.  Curettage  may be performed to either diagnose or treat a problem. As a diagnostic procedure, curettage is performed to examine tissues from the uterus. A diagnostic curettage may be performed for the following symptoms:   Irregular bleeding in the uterus.   Bleeding with the development of clots.   Spotting between menstrual periods.   Prolonged menstrual periods.   Bleeding after menopause.   No menstrual period (amenorrhea).   A change in size and shape of the uterus.  As a treatment procedure, curettage may be performed for the following reasons:   Removal of an IUD (intrauterine device).   Removal of retained placenta after giving birth. Retained placenta can cause an infection or bleeding severe enough to require transfusions.   Abortion.   Miscarriage.   Removal of polyps inside the uterus.   Removal of uncommon types of noncancerous lumps (fibroids).  LET [facility] CARE PROVIDER KNOW ABOUT:   Any allergies you have.   All medicines you are taking, including vitamins, herbs, eye drops, creams, and over-the-counter medicines.   Previous problems you or members of your family have had with the use of anesthetics.   Any blood disorders  you have.   Previous surgeries you have had.   Medical conditions you have. RISKS AND COMPLICATIONS  Generally, this is a safe procedure. However, as with any procedure, complications can occur. Possible complications include:  Excessive bleeding.   Infection of the uterus.   Damage to the cervix.   Development of scar tissue (adhesions) inside the uterus, later causing abnormal amounts of menstrual bleeding.   Complications from the general anesthetic, if a general anesthetic is used.   Putting a hole (perforation) in the uterus. This is rare.  BEFORE THE PROCEDURE   Eat and drink before the procedure only as directed by your health care provider.   Arrange for someone to take you home.  PROCEDURE  This  procedure usually takes about 15-30 minutes.  You will be given one of the following:  A medicine that numbs the area in and around the cervix (local anesthetic).   A medicine to make you sleep through the procedure (general anesthetic).  You will lie on your back with your legs in stirrups.   A warm metal or plastic instrument (speculum) will be placed in your vagina to keep it open and to allow the health care provider to see the cervix.  There are two ways in which your cervix can be softened and dilated. These include:   Taking a medicine.   Having thin rods (laminaria) inserted into your cervix.   A curved tool (curette) will be used to scrape cells from the inside lining of the uterus. In some cases, gentle suction is applied with the curette. The curette will then be removed.  AFTER THE PROCEDURE   You will rest in the recovery area until you are stable and are ready to go home.   You may feel sick to your stomach (nauseous) or throw up (vomit) if you were given a general anesthetic.   You may have a sore throat if a tube was placed in your throat during general anesthesia.   You may have light cramping and bleeding. This may last for 2 days to 2 weeks after the procedure.   Your uterus needs to make a new lining after the procedure. This may make your next period late.   This information is not intended to replace advice given to you by your health care provider. Make sure you discuss any questions you have with your health care provider.   Document Released: 08/14/2005 Document Revised: 04/16/2013 Document Reviewed: 03/13/2013 Elsevier Interactive Patient Education [employer]. Hysteroscopy Hysteroscopy is a procedure used for looking inside the womb (uterus). It may be done for various reasons, including:  To evaluate abnormal bleeding, fibroid (benign, noncancerous) tumors, polyps, scar tissue (adhesions), and possibly cancer of the uterus.  To look  for lumps (tumors) and other uterine growths.  To look for causes of why a woman cannot get pregnant (infertility), causes of recurrent loss of pregnancy (miscarriages), or a lost intrauterine device (IUD).  To perform a sterilization by blocking the fallopian tubes from inside the uterus. In this procedure, a thin, flexible tube with a tiny light and camera on the end of it (hysteroscope) is used to look inside the uterus. A hysteroscopy should be done right after a menstrual period to be sure you are not pregnant. LET [facility] CARE PROVIDER KNOW ABOUT:   Any allergies you have.  All medicines you are taking, including vitamins, herbs, eye drops, creams, and over-the-counter medicines.  Previous problems you or members of your family  have had with the use of anesthetics.  Any blood disorders you have.  Previous surgeries you have had.  Medical conditions you have. RISKS AND COMPLICATIONS  Generally, this is a safe procedure. However, as with any procedure, complications can occur. Possible complications include:  Putting a hole in the uterus.  Excessive bleeding.  Infection.  Damage to the cervix.  Injury to other organs.  Allergic reaction to medicines.  Too much fluid used in the uterus for the procedure. BEFORE THE PROCEDURE   Ask your health care provider about changing or stopping any regular medicines.  Do not take aspirin or blood thinners for 1 week before the procedure, or as directed by your health care provider. These can cause bleeding.  If you smoke, do not smoke for 2 weeks before the procedure.  In some cases, a medicine is placed in the cervix the day before the procedure. This medicine makes the cervix have a larger opening (dilate). This makes it easier for the instrument to be inserted into the uterus during the procedure.  Do not eat or drink anything for at least 8 hours before the surgery.  Arrange for someone to take you home after the  procedure. PROCEDURE   You may be given a medicine to relax you (sedative). You may also be given one of the following:  A medicine that numbs the area around the cervix (local anesthetic).  A medicine that makes you sleep through the procedure (general anesthetic).  The hysteroscope is inserted through the vagina into the uterus. The camera on the hysteroscope sends a picture to a TV screen. This gives the surgeon a good view inside the uterus.  During the procedure, air or a liquid is put into the uterus, which allows the surgeon to see better.  Sometimes, tissue is gently scraped from inside the uterus. These tissue samples are sent to a lab for testing. AFTER THE PROCEDURE   If you had a general anesthetic, you may be groggy for a couple hours after the procedure.  If you had a local anesthetic, you will be able to go home as soon as you are stable and feel ready.  You may have some cramping. This normally lasts for a couple days.  You may have bleeding, which varies from light spotting for a few days to menstrual-like bleeding for 3-7 days. This is normal.  If your test results are not back during the visit, make an appointment with your health care provider to find out the results.   This information is not intended to replace advice given to you by your health care provider. Make sure you discuss any questions you have with your health care provider.   Document Released: 11/20/2000 Document Revised: 06/04/2013 Document Reviewed: 03/13/2013 Elsevier Interactive Patient Education 2016 Elsevier Inc. PATIENT INSTRUCTIONS POST-ANESTHESIA  IMMEDIATELY FOLLOWING SURGERY:  Do not drive or operate machinery for the first twenty four hours after surgery.  Do not make any important decisions for twenty four hours after surgery or while taking narcotic pain medications or sedatives.  If you develop intractable nausea and vomiting or a severe headache please notify your doctor  immediately.  FOLLOW-UP:  Please make an appointment with your surgeon as instructed. You do not need to follow up with anesthesia unless specifically instructed to do so.  WOUND CARE INSTRUCTIONS (if applicable):  Keep a dry clean dressing on the anesthesia/puncture wound site if there is drainage.  Once the wound has quit draining you may leave  it open to air.  Generally you should leave the bandage intact for twenty four hours unless there is drainage.  If the epidural site drains for more than 36-48 hours please call the anesthesia department.  QUESTIONS?:  Please feel free to call your physician or the hospital operator if you have any questions, and they will be happy to assist you.

## 2015-06-23 ENCOUNTER — Inpatient Hospital Stay (HOSPITAL_COMMUNITY): Admission: RE | Admit: 2015-06-23 | Payer: BLUE CROSS/BLUE SHIELD | Source: Ambulatory Visit

## 2015-06-23 ENCOUNTER — Ambulatory Visit: Payer: BLUE CROSS/BLUE SHIELD | Admitting: Internal Medicine

## 2015-06-30 ENCOUNTER — Encounter (HOSPITAL_COMMUNITY)
Admission: RE | Admit: 2015-06-30 | Discharge: 2015-06-30 | Disposition: A | Discharge status: Home / Self Care | Payer: BLUE CROSS/BLUE SHIELD | Source: Ambulatory Visit | Attending: Obstetrics & Gynecology | Admitting: Obstetrics & Gynecology

## 2015-06-30 ENCOUNTER — Encounter (HOSPITAL_COMMUNITY): Payer: Self-pay

## 2015-07-02 ENCOUNTER — Ambulatory Visit (HOSPITAL_COMMUNITY): Payer: BLUE CROSS/BLUE SHIELD | Admitting: Anesthesiology

## 2015-07-02 ENCOUNTER — Encounter (HOSPITAL_COMMUNITY)
Admission: RE | Disposition: A | Discharge status: Home / Self Care | Payer: Self-pay | Source: Ambulatory Visit | Attending: Obstetrics & Gynecology

## 2015-07-02 ENCOUNTER — Ambulatory Visit (HOSPITAL_COMMUNITY)
Admission: RE | Admit: 2015-07-02 | Discharge: 2015-07-02 | Disposition: A | Discharge status: Home / Self Care | Payer: BLUE CROSS/BLUE SHIELD | Source: Ambulatory Visit | Attending: Obstetrics & Gynecology | Admitting: Obstetrics & Gynecology

## 2015-07-02 ENCOUNTER — Encounter (HOSPITAL_COMMUNITY): Payer: Self-pay | Admitting: *Deleted

## 2015-07-02 ENCOUNTER — Encounter: Payer: BLUE CROSS/BLUE SHIELD | Admitting: Obstetrics & Gynecology

## 2015-07-02 DIAGNOSIS — F419 Anxiety disorder, unspecified: Secondary | ICD-10-CM | POA: Insufficient documentation

## 2015-07-02 DIAGNOSIS — E663 Overweight: Secondary | ICD-10-CM | POA: Insufficient documentation

## 2015-07-02 DIAGNOSIS — Z6829 Body mass index (BMI) 29.0-29.9, adult: Secondary | ICD-10-CM | POA: Diagnosis not present

## 2015-07-02 DIAGNOSIS — Z79899 Other long term (current) drug therapy: Secondary | ICD-10-CM | POA: Insufficient documentation

## 2015-07-02 DIAGNOSIS — N946 Dysmenorrhea, unspecified: Secondary | ICD-10-CM | POA: Insufficient documentation

## 2015-07-02 DIAGNOSIS — N840 Polyp of corpus uteri: Secondary | ICD-10-CM

## 2015-07-02 DIAGNOSIS — I10 Essential (primary) hypertension: Secondary | ICD-10-CM | POA: Diagnosis not present

## 2015-07-02 DIAGNOSIS — E876 Hypokalemia: Secondary | ICD-10-CM | POA: Insufficient documentation

## 2015-07-02 DIAGNOSIS — N944 Primary dysmenorrhea: Secondary | ICD-10-CM | POA: Diagnosis not present

## 2015-07-02 HISTORY — PX: DILITATION & CURRETTAGE/HYSTROSCOPY WITH NOVASURE ABLATION: SHX5568

## 2015-07-02 LAB — PREGNANCY, URINE: Preg Test, Ur: NEGATIVE

## 2015-07-02 SURGERY — DILATATION & CURETTAGE/HYSTEROSCOPY WITH NOVASURE ABLATION
Anesthesia: General

## 2015-07-02 MED ORDER — FENTANYL CITRATE (PF) 100 MCG/2ML IJ SOLN
INTRAMUSCULAR | Status: AC
  Filled 2015-07-02: qty 4

## 2015-07-02 MED ORDER — SODIUM CHLORIDE 0.9 % IR SOLN
Status: DC | PRN
Start: 2015-07-02 — End: 2015-07-02
  Administered 2015-07-02: 3000 mL

## 2015-07-02 MED ORDER — MIDAZOLAM HCL 5 MG/5ML IJ SOLN
INTRAMUSCULAR | Status: DC | PRN
  Administered 2015-07-02: 2 mg via INTRAVENOUS

## 2015-07-02 MED ORDER — FENTANYL CITRATE (PF) 100 MCG/2ML IJ SOLN
INTRAMUSCULAR | Status: AC
  Filled 2015-07-02: qty 2

## 2015-07-02 MED ORDER — HYDROCODONE-ACETAMINOPHEN 5-325 MG PO TABS: 1.0000 | ORAL_TABLET | Freq: Four times a day (QID) | ORAL | Status: DC | PRN

## 2015-07-02 MED ORDER — KETOROLAC TROMETHAMINE 10 MG PO TABS: 10.0000 mg | ORAL_TABLET | Freq: Three times a day (TID) | ORAL | Status: DC | PRN

## 2015-07-02 MED ORDER — KETOROLAC TROMETHAMINE 30 MG/ML IJ SOLN
30.0000 mg | Freq: Once | INTRAMUSCULAR | Status: AC
Start: 2015-07-02 — End: 2015-07-02
  Administered 2015-07-02: 30 mg via INTRAVENOUS
  Filled 2015-07-02: qty 1

## 2015-07-02 MED ORDER — PHENYLEPHRINE-KETOROLAC 1-0.3 % IO SOLN
INTRAOCULAR | Status: AC
  Filled 2015-07-02: qty 4

## 2015-07-02 MED ORDER — MIDAZOLAM HCL 2 MG/2ML IJ SOLN
1.0000 mg | INTRAMUSCULAR | Status: DC | PRN
  Administered 2015-07-02: 2 mg via INTRAVENOUS

## 2015-07-02 MED ORDER — LACTATED RINGERS IV SOLN
INTRAVENOUS | Status: DC
  Administered 2015-07-02: 1000 mL via INTRAVENOUS

## 2015-07-02 MED ORDER — MIDAZOLAM HCL 2 MG/2ML IJ SOLN
INTRAMUSCULAR | Status: AC
  Filled 2015-07-02: qty 4

## 2015-07-02 MED ORDER — PROPOFOL 10 MG/ML IV BOLUS
INTRAVENOUS | Status: AC
  Filled 2015-07-02: qty 20

## 2015-07-02 MED ORDER — ONDANSETRON HCL 8 MG PO TABS: 8.0000 mg | ORAL_TABLET | Freq: Three times a day (TID) | ORAL | Status: DC | PRN

## 2015-07-02 MED ORDER — LIDOCAINE HCL 1 % IJ SOLN
INTRAMUSCULAR | Status: DC | PRN
  Administered 2015-07-02: 30 mg via INTRADERMAL

## 2015-07-02 MED ORDER — FENTANYL CITRATE (PF) 100 MCG/2ML IJ SOLN
25.0000 ug | INTRAMUSCULAR | Status: AC
  Administered 2015-07-02 (×2): 25 ug via INTRAVENOUS

## 2015-07-02 MED ORDER — ONDANSETRON HCL 4 MG/2ML IJ SOLN
4.0000 mg | Freq: Once | INTRAMUSCULAR | Status: AC | PRN
  Administered 2015-07-02: 4 mg via INTRAVENOUS
  Filled 2015-07-02: qty 2

## 2015-07-02 MED ORDER — FENTANYL CITRATE (PF) 100 MCG/2ML IJ SOLN
INTRAMUSCULAR | Status: DC | PRN
  Administered 2015-07-02 (×2): 50 ug via INTRAVENOUS

## 2015-07-02 MED ORDER — FENTANYL CITRATE (PF) 100 MCG/2ML IJ SOLN
25.0000 ug | INTRAMUSCULAR | Status: DC | PRN
  Administered 2015-07-02 (×2): 50 ug via INTRAVENOUS
  Filled 2015-07-02: qty 2

## 2015-07-02 MED ORDER — ONDANSETRON HCL 4 MG/2ML IJ SOLN
4.0000 mg | Freq: Once | INTRAMUSCULAR | Status: AC
  Administered 2015-07-02: 4 mg via INTRAVENOUS

## 2015-07-02 MED ORDER — CEFAZOLIN SODIUM-DEXTROSE 2-3 GM-% IV SOLR
2.0000 g | INTRAVENOUS | Status: AC
  Administered 2015-07-02: 2 g via INTRAVENOUS
  Filled 2015-07-02: qty 50

## 2015-07-02 MED ORDER — PROPOFOL 10 MG/ML IV BOLUS
INTRAVENOUS | Status: DC | PRN
  Administered 2015-07-02: 150 mg via INTRAVENOUS

## 2015-07-02 MED ORDER — MIDAZOLAM HCL 2 MG/2ML IJ SOLN
INTRAMUSCULAR | Status: AC
  Filled 2015-07-02: qty 2

## 2015-07-02 MED ORDER — ONDANSETRON HCL 4 MG/2ML IJ SOLN
INTRAMUSCULAR | Status: AC
  Filled 2015-07-02: qty 2

## 2015-07-02 SURGICAL SUPPLY — 27 items
ABLATOR ENDOMETRIAL BIPOLAR (ABLATOR) ×2 IMPLANT
BAG HAMPER (MISCELLANEOUS) ×2 IMPLANT
CLOTH BEACON ORANGE TIMEOUT ST (SAFETY) ×2 IMPLANT
COVER LIGHT HANDLE STERIS (MISCELLANEOUS) ×4 IMPLANT
FORMALIN 10 PREFIL 120ML (MISCELLANEOUS) ×2 IMPLANT
GLOVE BIOGEL PI IND STRL 7.0 (GLOVE) ×1 IMPLANT
GLOVE BIOGEL PI IND STRL 7.5 (GLOVE) IMPLANT
GLOVE BIOGEL PI IND STRL 8 (GLOVE) ×1 IMPLANT
GLOVE BIOGEL PI INDICATOR 7.0 (GLOVE) ×1
GLOVE BIOGEL PI INDICATOR 7.5 (GLOVE) ×1
GLOVE BIOGEL PI INDICATOR 8 (GLOVE) ×1
GLOVE ECLIPSE 6.5 STRL STRAW (GLOVE) ×1 IMPLANT
GLOVE ECLIPSE 8.0 STRL XLNG CF (GLOVE) ×2 IMPLANT
GOWN STRL REUS W/TWL LRG LVL3 (GOWN DISPOSABLE) ×2 IMPLANT
GOWN STRL REUS W/TWL XL LVL3 (GOWN DISPOSABLE) ×2 IMPLANT
INST SET HYSTEROSCOPY (KITS) ×2 IMPLANT
IV NS 1000ML (IV SOLUTION) ×2
IV NS 1000ML BAXH (IV SOLUTION) ×1 IMPLANT
IV NS IRRIG 3000ML ARTHROMATIC (IV SOLUTION) ×1 IMPLANT
KIT ROOM TURNOVER AP CYSTO (KITS) ×2 IMPLANT
MANIFOLD NEPTUNE II (INSTRUMENTS) ×2 IMPLANT
NS IRRIG 1000ML POUR BTL (IV SOLUTION) ×1 IMPLANT
PACK PERI GYN (CUSTOM PROCEDURE TRAY) ×2 IMPLANT
PAD ARMBOARD 7.5X6 YLW CONV (MISCELLANEOUS) ×2 IMPLANT
PAD TELFA 3X4 1S STER (GAUZE/BANDAGES/DRESSINGS) ×2 IMPLANT
SET BASIN LINEN APH (SET/KITS/TRAYS/PACK) ×2 IMPLANT
SET IRRIG Y TYPE TUR BLADDER L (SET/KITS/TRAYS/PACK) ×2 IMPLANT

## 2015-07-02 NOTE — Anesthesia Postprocedure Evaluation (Signed)
  Anesthesia Post-op Note  Patient: Angel Morris  Procedure(s) Performed: Procedure(s) with comments: DILATATION & CURETTAGE/HYSTEROSCOPY WITH NOVASURE ABLATION (N/A) - Uterine Cavity Length 5.5 Uterine Cavity Width 3.5 Power 106 Time 1 minute 4 seconds  Patient Location: PACU  Anesthesia Type:General  Level of Consciousness: awake, alert , oriented and patient cooperative  Airway and Oxygen Therapy: Patient Spontanous Breathing  Post-op Pain: 2 /10, mild  Post-op Assessment: Post-op Vital signs reviewed, Patient's Cardiovascular Status Stable, Respiratory Function Stable, Patent Airway and No signs of Nausea or vomiting              Post-op Vital Signs: Reviewed and stable  Last Vitals:  Filed Vitals:   07/02/15 1422  BP: 123/78  Pulse: 75  Temp:   Resp: 20    Complications: No apparent anesthesia complications

## 2015-07-02 NOTE — Transfer of Care (Signed)
Immediate Anesthesia Transfer of Care Note  Patient: Angel Morris  Procedure(s) Performed: Procedure(s) with comments: DILATATION & CURETTAGE/HYSTEROSCOPY WITH NOVASURE ABLATION (N/A) - Uterine Cavity Length 5.5 Uterine Cavity Width 3.5 Power 106 Time 1 minute 4 seconds  Patient Location: PACU  Anesthesia Type:General  Level of Consciousness: awake and patient cooperative  Airway & Oxygen Therapy: Patient Spontanous Breathing and Patient connected to face mask oxygen  Post-op Assessment: Report given to RN, Post -op Vital signs reviewed and stable and Patient moving all extremities  Post vital signs: Reviewed and stable  Last Vitals:  Filed Vitals:   07/02/15 1300  BP: 105/72  Pulse:   Temp:   Resp: 17    Complications: No apparent anesthesia complications

## 2015-07-02 NOTE — Op Note (Signed)
Preoperative diagnosis:                                         Dysmenorrhea                                          Postoperative diagnoses: Same as above   Procedure: Hysteroscopy, uterine curettage, endometrial ablation using Novasure  Surgeon: Florian Buff   Anesthesia: Laryngeal mask airway  Findings: The endometrium was significant for small endometrial polyp. There were no fibroid or other abnormalities.  Description of operation: The patient was taken to the operating room and placed in the supine position. She underwent general anesthesia using the laryngeal mask airway. She was placed in the dorsal lithotomy position and prepped and draped in the usual sterile fashion. A Graves speculum was placed and the anterior cervical lip was grasped with a single-tooth tenaculum. The cervix was dilated serially to allow passage of the hysteroscope. Diagnostic hysteroscopy was performed and was found to be normal. A vigorous uterine curettage was then performed and all tissue sent to pathology for evaluation.  I then proceeded to perform the Novasure endometrial ablation.  The cervical length was 3.0. The uterus sounded to  8.5 cm yielding a net length of 5.5 cm.  The endometrial cavity was 3.5 cm wide. The power was 106 watts.  The total time of therapy was 1 min 04 seconds. The array was evaluated after the procedure and tissue was adherent on all the dimensions of the surface, confirming fundal treatment as well.    All of the equipment worked well throughout the procedure.  The patient was awakened from anesthesia and taken to the recovery room in good stable condition all counts were correct. She received 2 g of Ancef and 30 mg of Toradol preoperatively. She will be discharged from the recovery room and followed up in the office in 1- 2 weeks.  Bertil Brickey H 07/02/2015 2:13 PM

## 2015-07-02 NOTE — Interval H&P Note (Signed)
History and Physical Interval Note:  07/02/2015 1:15 PM  Angel Morris  has presented today for surgery, with the diagnosis of dysmenorrhea, menometorrhagia  The various methods of treatment have been discussed with the patient and family. After consideration of risks, benefits and other options for treatment, the patient has consented to  Procedure(s) with comments: [place] (N/A) - Uterine Cavity Length Uterine Cavity Width Power Time as a surgical intervention .  The patient's history has been reviewed, patient examined, no change in status, stable for surgery.  I have reviewed the patient's chart and labs.  Questions were answered to the patient's satisfaction.     Vega Stare H

## 2015-07-02 NOTE — H&P (Signed)
Preoperative History and Physical  Angel Morris is a 39 y.o. [id number] with Patient's last menstrual period was 06/11/2015. admitted for a hysteroscopy D&C and NovaSure endometrial ablation.  This is the attachment from her last note US Transvaginal Non-ob  04/08/2015 GYNECOLOGIC SONOGRAM Angel Morris is a 39 y.o. [id number] LMP 03/16/2015. for a pelvic sonogram for dysmenorrhea. Uterus 7.21 x 5.5 x 5 cm, retroverted uterus w a 1.1 x .8 x .8cm post intramural fibroid Endometrium 3.4 mm, symmetrical, Right ovary 2.6 x 1.7 x 2.1 cm, wnl Left ovary 2.4 x 2.1 x 1.1 cm, wnl Technician Comments: US PELVIS TA/TV: retroverted uterus, 1.1 x .8 x .8cm intramural fibroid post bdy,EEC 3.59mm,normal ov's bilat (mobile),no free fluid seen,no pain during ultrasound [employer] 04/08/2015 10:54 AM Clinical Impression and recommendations: I have reviewed the sonogram results above, combined with the patient's current clinical course, below are my impressions and any appropriate recommendations for management based on the sonographic findings. Normal uterus with small clinically insignificant myoma Endometrium normal Ovaries normal No anatomical etiology for her dysmenorrhea is seen Angel Morris 04/08/2015 11:13 AM   US Pelvis Complete  04/08/2015 GYNECOLOGIC SONOGRAM Angel Morris is a 39 y.o. [id number] LMP 03/16/2015. for a pelvic sonogram for dysmenorrhea. Uterus 7.21 x 5.5 x 5 cm, retroverted uterus w a 1.1 x .8 x .8cm post intramural fibroid Endometrium 3.4 mm, symmetrical, Right ovary 2.6 x 1.7 x 2.1 cm, wnl Left ovary 2.4 x 2.1 x 1.1 cm, wnl Technician Comments: US PELVIS TA/TV: retroverted uterus, 1.1 x .8 x .8cm intramural fibroid post bdy,EEC 3.17mm,normal ov's bilat (mobile),no free fluid seen,no pain during ultrasound [employer] 04/08/2015 10:54 AM  Clinical Impression and recommendations: I have reviewed the sonogram results above, combined with the patient's current clinical course, below are my impressions and any appropriate recommendations for management based on the sonographic findings. Normal uterus with small clinically insignificant myoma Endometrium normal Ovaries normal No anatomical etiology for her dysmenorrhea is seen Angel Morris 04/08/2015 11:13 AM    Result of sonogram were discussed  Her dysmenorrhea has gone from a "10" to a "2-3" on the pre menses toradol, will continue  Recommend trying continuous ocp with withdrawal every 3 months  Discussed endo ablation for the future if she decides to go that direction, ball in her court  She still states that her pain is back up to waited 10 despite premenstrual Toradol at this point she wants to proceed with NovaSure endometrial ablation for management     PMH:  Past Medical History  Diagnosis Date  . Overweight(278.02)   . History of recurrent UTIs   . Anemia, iron deficiency   . Onychomycosis   . Anxiety   . Proteinuria   . Hypertension   . Menorrhagia   . Nephrolithiasis   . Kidney stones   . Uterine fibroid   . Abnormal pap     lSIL   . Human papilloma virus   . Abnormal Pap smear   . Contraceptive management 07/28/2013  . Headache(784.0)   . Asthma     childhood  . Wears contact lenses   . Hypokalemia     being addressed with her PMD  . Abnormal EKG     long QT    PSH:  Past Surgical History  Procedure Laterality Date  . Rotary cuff - left shoulder  2005  . Kidney stones  june 2000  . Dilation and curettage of uterus    . Cervical  conization w/bx N/A 10/08/2013    Procedure: CONIZATION CERVIX WITH BIOPSY; Surgeon: Florian Buff, MD; Location: [facility]; Service: Gynecology; Laterality: N/A;  . Holmium laser application N/A  1/61/0960    Procedure: HOLMIUM LASER APPLICATION; Surgeon: Florian Buff, MD; Location: [facility]; Service: Gynecology; Laterality: N/A;    POb/GynH:  OB History    Gravida Para Term Preterm AB TAB SAB Ectopic Multiple Living   7 4   3  3   4       SH:  Social History  Substance Use Topics  . Smoking status: Never Smoker   . Smokeless tobacco: Never Used  . Alcohol Use: Yes     Comment: rare    FH:  Family History  Problem Relation Age of Onset  . Fibromyalgia Mother   . Arthritis Mother   . Diabetes Mother   . Hypertension Mother   . Stroke Other   . Diabetes Brother   . Diabetes Father   . Stroke Maternal Grandmother   . Aneurysm Maternal Grandfather      Allergies: No Known Allergies  Medications:  Current outpatient prescriptions:  . amLODipine (NORVASC) 10 MG tablet, Take 1 tablet (10 mg total) by mouth daily., Disp: 90 tablet, Rfl: 3 . Norgestimate-Ethinyl Estradiol Triphasic (TRI-SPRINTEC) 0.18/0.215/0.25 MG-35 MCG tablet, Take 1 tablet by mouth daily., Disp: 28 tablet, Rfl: 11 . spironolactone (ALDACTONE) 25 MG tablet, Take 1 tablet (25 mg total) by mouth daily., Disp: 90 tablet, Rfl: 3 . triamcinolone cream (KENALOG) 0.1 %, Apply 1 application topically 2 (two) times daily., Disp: 45 g, Rfl: 0 . ketorolac (TORADOL) 10 MG tablet, Take 10 mg by mouth every 8 (eight) hours as needed (pain)., Disp: , Rfl:  . metroNIDAZOLE (METROGEL VAGINAL) 0.75 % vaginal gel, Nightly x 5 nights (Patient not taking: Reported on 06/14/2015), Disp: 70 g, Rfl: 0 . Multiple Vitamins-Minerals (MULTIVITAMINS THER. W/MINERALS) TABS tablet, Take 1 tablet by mouth daily., Disp: , Rfl:  . potassium chloride (K-DUR) 10 MEQ tablet, Take 20 mEq by mouth daily., Disp: , Rfl:   Review of Systems:   Review of Systems  Constitutional: Negative for fever, chills, weight loss,  malaise/fatigue and diaphoresis.  HENT: Negative for hearing loss, ear pain, nosebleeds, congestion, sore throat, neck pain, tinnitus and ear discharge.  Eyes: Negative for blurred vision, double vision, photophobia, pain, discharge and redness.  Respiratory: Negative for cough, hemoptysis, sputum production, shortness of breath, wheezing and stridor.  Cardiovascular: Negative for chest pain, palpitations, orthopnea, claudication, leg swelling and PND.  Gastrointestinal: Positive for abdominal pain. Negative for heartburn, nausea, vomiting, diarrhea, constipation, blood in stool and melena.  Genitourinary: Negative for dysuria, urgency, frequency, hematuria and flank pain.  Musculoskeletal: Negative for myalgias, back pain, joint pain and falls.  Skin: Negative for itching and rash.  Neurological: Negative for dizziness, tingling, tremors, sensory change, speech change, focal weakness, seizures, loss of consciousness, weakness and headaches.  Endo/Heme/Allergies: Negative for environmental allergies and polydipsia. Does not bruise/bleed easily.  Psychiatric/Behavioral: Negative for depression, suicidal ideas, hallucinations, memory loss and substance abuse. The patient is not nervous/anxious and does not have insomnia.     PHYSICAL EXAM:  Blood pressure 120/70, pulse 68, weight 173 lb (78.472 kg), last menstrual period 06/11/2015.   Vitals reviewed. Constitutional: She is oriented to person, place, and time. She appears well-developed and well-nourished.  HENT:  Head: Normocephalic and atraumatic.  Right Ear: External ear normal.  Left Ear: External ear normal.  Nose: Nose normal.  Mouth/Throat: Oropharynx  is clear and moist.  Eyes: Conjunctivae and EOM are normal. Pupils are equal, round, and reactive to light. Right eye exhibits no discharge. Left eye exhibits no discharge. No scleral icterus.  Neck: Normal range of motion. Neck supple. No tracheal deviation present. No  thyromegaly present.  Cardiovascular: Normal rate, regular rhythm, normal heart sounds and intact distal pulses. Exam reveals no gallop and no friction rub.  No murmur heard. Respiratory: Effort normal and breath sounds normal. No respiratory distress. She has no wheezes. She has no rales. She exhibits no tenderness.  GI: Soft. Bowel sounds are normal. She exhibits no distension and no mass. There is tenderness. There is no rebound and no guarding.  Genitourinary:   Vulva is normal without lesions Vagina is pink moist without discharge Cervix normal in appearance and pap is normal Uterus is normal size, contour, position, consistency, mobility, non-tender Adnexa is negative with normal sized ovaries by sonogram  Musculoskeletal: Normal range of motion. She exhibits no edema and no tenderness.  Neurological: She is alert and oriented to person, place, and time. She has normal reflexes. She displays normal reflexes. No cranial nerve deficit. She exhibits normal muscle tone. Coordination normal.  Skin: Skin is warm and dry. No rash noted. No erythema. No pallor.  Psychiatric: She has a normal mood and affect. Her behavior is normal. Judgment and thought content normal.    Labs: Results for orders placed or performed during the hospital encounter of 06/02/15 (from the past 336 hour(s))  Exercise Tolerance Test   Collection Time: 06/02/15 10:36 AM  Result Value Ref Range   Rest HR 50 bpm   Rest BP 119/74 mmHg   Exercise duration (min) 9 min   Exercise duration (sec) 33 sec   Estimated workload 10.9 METS   Peak HR 146 bpm   Peak BP 134/96 mmHg   MPHR 181 bpm   Percent HR 80 %   RPE 16     EKG: Orders placed or performed in visit on 05/05/15  . EKG 12-Lead    Imaging Studies:  Imaging Results    No results found.      Assessment: Patient Active Problem List   Diagnosis Date Noted  . Prolonged Q-T interval on ECG  05/05/2015  . Nonspecific abnormal electrocardiogram (ECG) (EKG) 03/29/2015  . Essential hypertension 03/29/2015  . Routine general medical examination at a health care facility 03/29/2015  . Hypokalemia 03/29/2015  . 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 Contraceptive management 07/28/2013    Plan: Hysteroscopy D&C endometrial ablation with NovaSure  Laurey Salser Morris 06/14/2015 12:15 PM

## 2015-07-02 NOTE — Discharge Instructions (Signed)
Endometrial Ablation °Endometrial ablation removes the lining of the uterus (endometrium). It is usually a same-day, outpatient treatment. Ablation helps avoid major surgery, such as surgery to remove the cervix and uterus (hysterectomy). After endometrial ablation, you will have little or no menstrual bleeding and may not be able to have children. However, if you are premenopausal, you will need to use a reliable method of birth control following the procedure because of the small chance that pregnancy can occur. °There are different reasons to have this procedure. These reasons include: °· Heavy periods. °· Bleeding that is causing anemia. °· Irregular bleeding. °· Bleeding fibroids on the lining inside the uterus if they are smaller than 3 centimeters. °This procedure may not be possible for you if:  °· You want to have children in the future.   °· You have severe cramps with your menstrual period.   °· You have precancerous or cancerous cells in your uterus.   °· You were recently pregnant.   °· You have gone through menopause.   °· You have had major surgery on your uterus, resulting in thinning of the uterine wall. Surgeries may include: °¨ The removal of one or more uterine fibroids (myomectomy). °¨ A cesarean section with a classic (vertical) incision on your uterus. Ask your health care provider what type of cesarean you had. Sometimes the scar on your skin is different than the scar on your uterus. °Even if you have had surgery on your uterus, certain types of ablation may still be safe for you. Talk with your health care provider. °LET YOUR HEALTH CARE PROVIDER KNOW ABOUT: °· Any allergies you have. °· All medicines you are taking, including vitamins, herbs, eye drops, creams, and over-the-counter medicines. °· Previous problems you or members of your family have had with the use of anesthetics. °· Any blood disorders you have. °· Previous surgeries you have had. °· Medical conditions you have. °RISKS AND  COMPLICATIONS  °Generally, this is a safe procedure. However, as with any procedure, complications can occur. Possible complications include: °· Perforation of the uterus. °· Bleeding. °· Infection of the uterus, bladder, or vagina. °· Injury to surrounding organs. °· An air bubble to the lung (air embolus). °· Pregnancy following the procedure. °· Failure of the procedure to help the problem, requiring hysterectomy. °· Decreased ability to diagnose cancer in the lining of the uterus. °BEFORE THE PROCEDURE °· The lining of the uterus must be tested to make sure there is no pre-cancerous or cancer cells present. °· An ultrasound may be performed to look at the size of the uterus and to check for abnormalities. °· Medicines may be given to thin the lining of the uterus. °PROCEDURE  °During the procedure, your health care provider will use a tool called a resectoscope to help see inside your uterus. There are different ways to remove the lining of your uterus.  °· Radiofrequency - This method uses a radiofrequency-alternating electric current to remove the lining of the uterus. °· Cryotherapy - This method uses extreme cold to freeze the lining of the uterus. °· Heated-Free Liquid - This method uses heated salt (saline) solution to remove the lining of the uterus. °· Microwave - This method uses high-energy microwaves to heat up the lining of the uterus to remove it. °· Thermal balloon - This method involves inserting a catheter with a balloon tip into the uterus. The balloon tip is filled with heated fluid to remove the lining of the uterus. °AFTER THE PROCEDURE  °After your procedure, do   not have sexual intercourse or insert anything into your vagina until permitted by your health care provider. After the procedure, you may experience: °· Cramps. °· Vaginal discharge. °· Frequent urination. °  °This information is not intended to replace advice given to you by your health care provider. Make sure you discuss any  questions you have with your health care provider. °  °Document Released: 06/23/2004 Document Revised: 05/05/2015 Document Reviewed: 01/15/2013 °Elsevier Interactive Patient Education ©2016 [employer] ° °

## 2015-07-02 NOTE — Anesthesia Preprocedure Evaluation (Signed)
Anesthesia Evaluation  Patient identified by MRN, date of birth, ID band Patient awake    Reviewed: Allergy & Precautions, H&P , NPO status , Patient's Chart, lab work & pertinent test results  History of Anesthesia Complications (+) history of anesthetic complications  Airway Mallampati: I  TM Distance: >3 FB     Dental  (+) Teeth Intact   Pulmonary asthma ,    breath sounds clear to auscultation       Cardiovascular hypertension, Pt. on medications  Rhythm:Regular Rate:Normal     Neuro/Psych  Headaches, Anxiety    GI/Hepatic negative GI ROS,   Endo/Other    Renal/GU Renal disease     Musculoskeletal   Abdominal   Peds  Hematology  (+) anemia ,   Anesthesia Other Findings   Reproductive/Obstetrics                             Anesthesia Physical Anesthesia Plan  ASA: II  Anesthesia Plan: General   Post-op Pain Management:    Induction: Intravenous  Airway Management Planned: LMA  Additional Equipment:   Intra-op Plan:   Post-operative Plan: Extubation in OR  Informed Consent: I have reviewed the patients History and Physical, chart, labs and discussed the procedure including the risks, benefits and alternatives for the proposed anesthesia with the patient or authorized representative who has indicated his/her understanding and acceptance.     Plan Discussed with:   Anesthesia Plan Comments:         Anesthesia Quick Evaluation

## 2015-07-02 NOTE — Anesthesia Procedure Notes (Signed)
Procedure Name: LMA Insertion Date/Time: 07/02/2015 1:33 PM Performed by: Charmaine Downs Pre-anesthesia Checklist: Patient identified, Emergency Drugs available, Patient being monitored and Suction available Patient Re-evaluated:Patient Re-evaluated prior to inductionOxygen Delivery Method: Circle system utilized Preoxygenation: Pre-oxygenation with 100% oxygen Intubation Type: IV induction Ventilation: Mask ventilation without difficulty LMA: LMA inserted LMA Size: 4.0 Grade View: Grade I Number of attempts: 1 Placement Confirmation: positive ETCO2 and breath sounds checked- equal and bilateral Tube secured with: Tape Dental Injury: Teeth and Oropharynx as per pre-operative assessment

## 2015-07-05 ENCOUNTER — Encounter (HOSPITAL_COMMUNITY): Payer: Self-pay | Admitting: Obstetrics & Gynecology

## 2015-07-09 ENCOUNTER — Encounter: Payer: BLUE CROSS/BLUE SHIELD | Admitting: Obstetrics & Gynecology

## 2015-07-16 ENCOUNTER — Encounter: Payer: Self-pay | Admitting: Obstetrics & Gynecology

## 2015-07-16 ENCOUNTER — Ambulatory Visit (INDEPENDENT_AMBULATORY_CARE_PROVIDER_SITE_OTHER): Payer: BLUE CROSS/BLUE SHIELD | Admitting: Obstetrics & Gynecology

## 2015-07-16 VITALS — BP 138/80 | HR 64 | Ht 65.5 in | Wt 178.0 lb

## 2015-07-16 DIAGNOSIS — Z9889 Other specified postprocedural states: Secondary | ICD-10-CM

## 2015-07-16 NOTE — Progress Notes (Signed)
Patient ID: Angel Morris, female   DOB: 27-Dec-1975, 39 y.o.   MRN: [id number]    HPI: Patient returns for routine postoperative follow-up having undergone hysteroscopy uterine curettage endometrial ablation on 07/02/2015.  The patient's immediate postoperative recovery has been unremarkable. Since hospital discharge the patient reports unremarkable.   Current Outpatient Prescriptions: amLODipine (NORVASC) 10 MG tablet, Take 1 tablet (10 mg total) by mouth daily., Disp: 90 tablet, Rfl: 3 HYDROcodone-acetaminophen (NORCO/VICODIN) 5-325 MG tablet, Take 1 tablet by mouth every 6 (six) hours as needed., Disp: 30 tablet, Rfl: 0 IRON PO, Take 1 tablet by mouth 2 (two) times a week., Disp: , Rfl:  MAGNESIUM PO, Take 1 tablet by mouth daily., Disp: , Rfl:  Multiple Vitamins-Minerals (MULTIVITAMINS THER. W/MINERALS) TABS tablet, Take 1 tablet by mouth daily., Disp: , Rfl:  Norgestimate-Ethinyl Estradiol Triphasic (TRI-SPRINTEC) 0.18/0.215/0.25 MG-35 MCG tablet, Take 1 tablet by mouth daily., Disp: 28 tablet, Rfl: 11 ondansetron (ZOFRAN) 8 MG tablet, Take 1 tablet (8 mg total) by mouth every 8 (eight) hours as needed for nausea., Disp: 12 tablet, Rfl: 0 spironolactone (ALDACTONE) 25 MG tablet, Take 1 tablet (25 mg total) by mouth daily., Disp: 90 tablet, Rfl: 3 ketorolac (TORADOL) 10 MG tablet, Take 1 tablet (10 mg total) by mouth every 8 (eight) hours as needed., Disp: 15 tablet, Rfl: 0  No current facility-administered medications for this visit.    Blood pressure 138/80, pulse 64, height 5' 5.5" (1.664 m), weight 178 lb (80.74 kg).  Physical Exam: Normal post ablation, watery discharge No odor, uterus non tender  Diagnostic Tests:   Pathology: benign  Impression:   ICD-9-CM ICD-10-CM   1. S/P endometrial ablation V45.89 Z98.890   2. Postoperative state V45.89 Z98.890     Plan: No sex til all discharge stops  Follow up: prn    Florian Buff, MD

## 2015-07-26 ENCOUNTER — Encounter: Payer: Self-pay | Admitting: Internal Medicine

## 2015-07-26 ENCOUNTER — Telehealth: Payer: Self-pay | Admitting: *Deleted

## 2015-07-26 ENCOUNTER — Ambulatory Visit (INDEPENDENT_AMBULATORY_CARE_PROVIDER_SITE_OTHER): Payer: BLUE CROSS/BLUE SHIELD | Admitting: Internal Medicine

## 2015-07-26 VITALS — BP 124/84 | HR 54 | Ht 65.0 in | Wt 175.8 lb

## 2015-07-26 DIAGNOSIS — E876 Hypokalemia: Secondary | ICD-10-CM

## 2015-07-26 DIAGNOSIS — I4581 Long QT syndrome: Secondary | ICD-10-CM | POA: Diagnosis not present

## 2015-07-26 DIAGNOSIS — I10 Essential (primary) hypertension: Secondary | ICD-10-CM | POA: Diagnosis not present

## 2015-07-26 DIAGNOSIS — R9431 Abnormal electrocardiogram [ECG] [EKG]: Secondary | ICD-10-CM

## 2015-07-26 LAB — BASIC METABOLIC PANEL
BUN: 13 mg/dL (ref 7–25)
CHLORIDE: 101 mmol/L (ref 98–110)
CO2: 25 mmol/L (ref 20–31)
Calcium: 8.7 mg/dL (ref 8.6–10.2)
Creat: 0.81 mg/dL (ref 0.50–1.10)
GLUCOSE: 90 mg/dL (ref 65–99)
POTASSIUM: 3.8 mmol/L (ref 3.5–5.3)
Sodium: 135 mmol/L (ref 135–146)

## 2015-07-26 LAB — MAGNESIUM: Magnesium: 2 mg/dL (ref 1.5–2.5)

## 2015-07-26 NOTE — Patient Instructions (Signed)
Medication Instructions:  Your physician recommends that you continue on your current medications as directed. Please refer to the Current Medication list given to you today.   Labwork: Your physician recommends that you return for lab work today: BMP/MAG   Testing/Procedures: None ordered   Follow-Up: Your physician recommends that you schedule a follow-up appointment as needed   Any Other Special Instructions Will Be Listed Below (If Applicable).     If you need a refill on your cardiac medications before your next appointment, please call your pharmacy.

## 2015-07-26 NOTE — Progress Notes (Signed)
Electrophysiology Office Note   Date:  07/26/2015   ID:  SAHITI COLLAZO, DOB October 15, 1975, MRN [id number]  PCP:  Crisoforo Oxford, PA-C  Cardiologist:   Primary Electrophysiologist: Dr. Rayann Heman    Chief Complaint  Patient presents with  . Long Q-T syndrome     History of Present Illness: Angel Morris is a 39 y.o. female who presents today for follow-up.  Doing well presently.  She had muscle cramps with spironolactone and thus stopped this medicine.  Her cramps have resolved.  BP remains normal.  She is clinically asymptomatic.  She denies exertional CP or SOB but is not very active.  She denies FH of long QT or sudden death. Denies syncope,  spells, no near syncope.     Past Medical History  Diagnosis Date  . Overweight(278.02)   . History of recurrent UTIs   . Anemia, iron deficiency   . Onychomycosis   . Anxiety   . Proteinuria   . Hypertension   . Menorrhagia   . Nephrolithiasis   . Kidney stones   . Uterine fibroid   . Abnormal pap     lSIL   . Human papilloma virus   . Abnormal Pap smear   . Contraceptive management 07/28/2013  . Headache(784.0)   . Asthma     childhood  . Wears contact lenses   . Hypokalemia     being addressed with her PMD  . Abnormal EKG     long QT  . Complication of anesthesia     States "when I had kidney stones removed(Open); I had a build up of fluid in my lungs. But I dont really remember much about it otherwise".   Past Surgical History  Procedure Laterality Date  . Rotary cuff - left shoulder  2005  . Kidney stones  june 2000    open removal  . Dilation and curettage of uterus    . Cervical conization w/bx N/A 10/08/2013    Procedure: CONIZATION CERVIX WITH BIOPSY;  Surgeon: Florian Buff, MD;  Location: [facility];  Service: Gynecology;  Laterality: N/A;  . Holmium laser application N/A [id number]    Procedure: HOLMIUM LASER APPLICATION;  Surgeon: Florian Buff, MD;  Location: [facility];  Service: Gynecology;  Laterality:  N/A;  . Rotator cuff repair Left   . Dilitation & currettage/hystroscopy with novasure ablation N/A 07/02/2015    Procedure: DILATATION & CURETTAGE/HYSTEROSCOPY WITH NOVASURE ABLATION;  Surgeon: Florian Buff, MD;  Location: [facility];  Service: Gynecology;  Laterality: N/A;  Uterine Cavity Length 5.5 Uterine Cavity Width 3.5 Power 106 Time 1 minute 4 seconds     Current Outpatient Prescriptions  Medication Sig Dispense Refill  . amLODipine (NORVASC) 10 MG tablet Take 1 tablet (10 mg total) by mouth daily. 90 tablet 3  . HYDROcodone-acetaminophen (NORCO/VICODIN) 5-325 MG tablet Take 1 tablet by mouth every 6 (six) hours as needed (pain).    . IRON PO Take 1 tablet by mouth 2 (two) times a week.    Marland Kitchen ketorolac (TORADOL) 10 MG tablet Take 1 tablet (10 mg total) by mouth every 8 (eight) hours as needed. 15 tablet 0  . MAGNESIUM PO Take 1 tablet by mouth daily.    . Norgestimate-Ethinyl Estradiol Triphasic (TRI-SPRINTEC) 0.18/0.215/0.25 MG-35 MCG tablet Take 1 tablet by mouth daily. 28 tablet 11  . ondansetron (ZOFRAN) 8 MG tablet Take 1 tablet (8 mg total) by mouth every 8 (eight) hours as needed for nausea. 12 tablet  0   No current facility-administered medications for this visit.    Allergies:   Review of patient's allergies indicates no known allergies.   Social History:  The patient  reports that she has never smoked. She has never used smokeless tobacco. She reports that she drinks alcohol. She reports that she does not use illicit drugs.   Family History:  The patient's family history includes Aneurysm in her maternal grandfather; Arthritis in her mother; Diabetes in her brother, father, and mother; Fibromyalgia in her mother; Hypertension in her mother; Stroke in her maternal grandmother and other. Again, no family history of sudden death or Long QT syndrome.   ROS:  Please see the history of present illness.   All other systems are reviewed and negative.    PHYSICAL EXAM: VS:  BP  124/84 mmHg  Pulse 54  Ht 5\' 5"  (1.651 m)  Wt 175 lb 12.8 oz (79.742 kg)  BMI 29.25 kg/m2 , BMI Body mass index is 29.25 kg/(m^2). GEN: Well nourished, well developed, in no acute distress HEENT: normal Neck: no JVD, carotid bruits, or masses Cardiac: RRR; no murmurs, rubs, or gallops,no edema  Respiratory:  clear to auscultation bilaterally, normal work of breathing GI: soft, nontender, nondistended, + BS MS: no deformity or atrophy Skin: warm and dry  Neuro:  Strength and sensation are intact Psych: euthymic mood, full affect  EKG:  EKG is ordered today. The ekg ordered today shows sinus rhythm 54 bpm, QTc 415 msec   Recent Labs: 03/30/2015: TSH 0.834 05/21/2015: Magnesium 1.8 06/21/2015: ALT 14; BUN 14; Creatinine, Ser 0.72; Hemoglobin 12.9; Platelets 255; Potassium 4.7; Sodium 137    Lipid Panel     Component Value Date/Time   CHOL 141 03/30/2015 0738   TRIG 77 03/30/2015 0738   HDL 76 03/30/2015 0738   CHOLHDL 1.9 03/30/2015 0738   VLDL 15 03/30/2015 0738   LDLCALC 50 03/30/2015 0738     Wt Readings from Last 3 Encounters:  07/26/15 175 lb 12.8 oz (79.742 kg)  07/16/15 178 lb (80.74 kg)  07/02/15 175 lb (79.379 kg)      Other studies Reviewed: Additional studies: echo and ETT discussed with patient today   ASSESSMENT AND PLAN:  1.  HTN Improved Echo reviewed with patient today Repeat bmet, mg off of spironolactone  2. Hypokalemia Resolved off of hctz Bmet, mg today  3.  Prolonged QT     Asymptomatic     No FH of prolonged QT     Would keep Mg>1.9 and K>3.9     She should return if she has any syncopal symptoms     Avoid QT prolonging drugs going forward  4. SOB- minimal per patient Regular exercise encouraged Hypotensive response noted during ETT.  Likely due to spironolactone which she did not tolerate at that time. She does not wish to pursue any further testing at this time.  Given absence of chest pain, will observe conservatively.  She will  contact my office if ischemic symptoms occur  Follow-up with primary care I will see as needed going forward  Signed, Thompson Grayer, MD  07/26/2015 1:07 PM     [place] [address] [place] [address] [phone] (office) [phone] (fax)

## 2015-07-26 NOTE — Telephone Encounter (Signed)
Pt states had Endometrial Ablation on 07/02/2015 is now having light bleeding after sex, no odor, no fever.. Pt informed to continue to monitor if bleeding increase or continues for days to call our office back, no sex for several days. Pt verbalized understanding.

## 2015-09-03 ENCOUNTER — Encounter: Payer: Self-pay | Admitting: Medical

## 2015-09-03 ENCOUNTER — Ambulatory Visit (INDEPENDENT_AMBULATORY_CARE_PROVIDER_SITE_OTHER): Payer: BLUE CROSS/BLUE SHIELD | Admitting: Medical

## 2015-09-03 VITALS — BP 120/90 | HR 62 | Wt 179.0 lb

## 2015-09-03 DIAGNOSIS — I10 Essential (primary) hypertension: Secondary | ICD-10-CM | POA: Diagnosis not present

## 2015-09-03 DIAGNOSIS — L299 Pruritus, unspecified: Secondary | ICD-10-CM

## 2015-09-03 MED ORDER — HYDROXYZINE HCL 10 MG PO TABS: 10.0000 mg | ORAL_TABLET | Freq: Three times a day (TID) | ORAL | Status: DC | PRN

## 2015-09-03 MED ORDER — NALTREXONE-BUPROPION HCL ER 8-90 MG PO TB12: 1.0000 | ORAL_TABLET | Freq: Two times a day (BID) | ORAL | Status: DC

## 2015-09-03 NOTE — Progress Notes (Signed)
Subjective: Chief Complaint  Patient presents with  . Pruritis    all over. no rashes or bumps. started mainly at work now it is carrying into her everyday life.    Started about a month ago with itching, and it is persistent.   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   Been there since May, hasn't had a problem until a month ago.  Thinks the itching is related to the water or the tape.   Changed to different type of gloves but that didn't help.   Was primarily an issue at work but then seemed to be a problem at home too of late.  Once she starts itching, can't stop.   Now itches at home or work. Some friends thinks it her nerves.   Drinks alcohol every now and then, not regularly.  Denies jaundice or yellowing of eyes.   No belly pain.  Uses cocoa butter Vaseline daily, no recent soap or detergent changes, changed sheets .  Has no pets at homes.   Compliant with amlodipine for BP.  Was taken off Spironolactone dueto muscle cramps and BP running low.  Saw cardiology since last visit here.   Past Medical History  Diagnosis Date  . Overweight(278.02)   . History of recurrent UTIs   . Anemia, iron deficiency   . Onychomycosis   . Anxiety   . Proteinuria   . Hypertension   . Menorrhagia   . Nephrolithiasis   . Kidney stones   . Uterine fibroid   . Abnormal pap     lSIL   . Human papilloma virus   . Abnormal Pap smear   . Contraceptive management 07/28/2013  . Headache(784.0)   . Asthma     childhood  . Wears contact lenses   . Hypokalemia     being addressed with her PMD  . Abnormal EKG     long QT  . Complication of anesthesia     States "when I had kidney stones removed(Open); I had a build up of fluid in my lungs. But I dont really remember much about it otherwise".   ROS as in subjective   Objective: BP 120/90 mmHg  Pulse 62  Wt 179 lb (81.194 kg)  Gen: wd,  Wn, nad No rash Lungs clear No oral  lesions   Assessment: Encounter Diagnoses  Name Primary?  . Pruritic condition Yes  . Essential hypertension     Plan: Likely due to trigger/allergen at work.  Advised she begin Hydroxyzine BID, ask supervisor about working in different area for the next 2 weeks to see if the issue resolves.    If not can pursue allergy testing, other treatment and eval.   She agrees with plan.  C/t amlodipine, c/t to monitor BPs which are normal at home.

## 2015-09-05 ENCOUNTER — Encounter: Payer: Self-pay | Admitting: Medical

## 2015-09-06 ENCOUNTER — Telehealth: Payer: Self-pay | Admitting: Medical

## 2015-09-06 MED ORDER — NALTREXONE-BUPROPION HCL ER 8-90 MG PO TB12: 2.0000 | ORAL_TABLET | Freq: Two times a day (BID) | ORAL | Status: DC

## 2015-09-06 NOTE — Telephone Encounter (Signed)
Called pt & made her aware of approval and also the correct dosing of 2 tablets twice a day per [place].  Called pharmacy & advised of dosing change and P.A. Approval & went thru ins for $60

## 2015-09-06 NOTE — Telephone Encounter (Signed)
P.A. CONTRAVE on Cover my  meds, states drug not covered.  Called [phone] & was able to get approval for 9 weeks til 11/29/15 & pt must achieve 4% weight loss to continue

## 2015-09-16 ENCOUNTER — Telehealth: Payer: Self-pay | Admitting: Medical

## 2015-09-16 ENCOUNTER — Telehealth: Payer: Self-pay | Admitting: Obstetrics & Gynecology

## 2015-09-16 NOTE — Telephone Encounter (Signed)
Pt called stating that she would to for the doctor to change her birth control prescription from 30 to 60 days. Please contact pt

## 2015-09-16 NOTE — Telephone Encounter (Signed)
Pt states that Contrave is causing nausea, lightheadedness, and dizziness every since being on this med about the 4th or 5th day. She was instructed to take 1 pill then move up to 2 pills at a time but after all of these side effects started she went back down to taking 1 pill twice a day but still having all the same side effects. Should pt expect this all to go away with time?

## 2015-09-17 NOTE — Telephone Encounter (Signed)
Pt is aware stated she will do this for 2 weeks

## 2015-09-17 NOTE — Telephone Encounter (Signed)
Maybe have her just use 1 tablet daily for now and give it time to resolve.

## 2015-09-17 NOTE — Telephone Encounter (Signed)
LMTCB

## 2015-09-22 ENCOUNTER — Telehealth: Payer: Self-pay | Admitting: Obstetrics & Gynecology

## 2015-09-22 ENCOUNTER — Telehealth: Payer: Self-pay | Admitting: *Deleted

## 2015-09-22 MED ORDER — NORGESTIM-ETH ESTRAD TRIPHASIC 0.18/0.215/0.25 MG-35 MCG PO TABS: 1.0000 | ORAL_TABLET | Freq: Every day | ORAL | Status: DC

## 2015-09-28 NOTE — Telephone Encounter (Signed)
RX sent

## 2015-12-31 ENCOUNTER — Other Ambulatory Visit: Payer: Self-pay

## 2015-12-31 DIAGNOSIS — Z1231 Encounter for screening mammogram for malignant neoplasm of breast: Secondary | ICD-10-CM

## 2016-02-04 ENCOUNTER — Ambulatory Visit (INDEPENDENT_AMBULATORY_CARE_PROVIDER_SITE_OTHER): Payer: BLUE CROSS/BLUE SHIELD | Admitting: Family Medicine

## 2016-02-04 ENCOUNTER — Encounter: Payer: Self-pay | Admitting: Family Medicine

## 2016-02-04 VITALS — BP 120/80 | HR 72 | Wt 179.0 lb

## 2016-02-04 DIAGNOSIS — M76821 Posterior tibial tendinitis, right leg: Secondary | ICD-10-CM | POA: Diagnosis not present

## 2016-02-04 NOTE — Progress Notes (Signed)
   Subjective:    Patient ID: Angel Morris, female    DOB: [birth year], 40 y.o.   MRN: [id number]  HPI She complains of a 3 month history of right medial foot pain that has been off and on but recently has been causing more trouble. She feels that she has some swelling on the medial aspect of the foot. No history of injury or overuse. She has been using an Ace wrap with minimal success. Her job requires her to stand for long periods of time.   Review of Systems     Objective:   Physical Exam Exam of the right ankle shows full motion. There is some pain on dorsiflexion and pain on palpation to the posterior aspect of the medial malleolus.       Assessment & Plan:  Tibialis posterior tendinitis, right She has exquisite tenderness over the tibialis posterior tendon. I explained this to her in detail. Recommend to leave twice per day, heat for 20 minutes 3 times per day and heel lifts. She will let me know how this works.

## 2016-02-04 NOTE — Patient Instructions (Signed)
2 Aleve twice per day for the next 10 days to 2 weeks. Heat to the area 20 minutes 3 times per day. Heel cups

## 2016-02-08 ENCOUNTER — Ambulatory Visit
Admission: RE | Admit: 2016-02-08 | Discharge: 2016-02-08 | Disposition: A | Discharge status: Home / Self Care | Payer: BLUE CROSS/BLUE SHIELD | Source: Ambulatory Visit

## 2016-02-08 DIAGNOSIS — Z1231 Encounter for screening mammogram for malignant neoplasm of breast: Secondary | ICD-10-CM

## 2016-02-09 ENCOUNTER — Telehealth: Payer: Self-pay | Admitting: Medical

## 2016-02-09 NOTE — Telephone Encounter (Signed)
Have her switch to ibuprofen 4 pills 3 times per day. Have her get in to see Erskin Burnet [id number]

## 2016-02-09 NOTE — Telephone Encounter (Signed)
Pt says she has not had much improvement with foot pain and she has been doing the things that she was asked to do including wearing heel cuffs in her shoes. Pt said her toes are starting to go numb now with the pain. She called [facility] to make an appt but can not bee seen until 6/28. What else should she do?

## 2016-02-09 NOTE — Telephone Encounter (Signed)
Pt informed and verbalized understanding

## 2016-02-10 ENCOUNTER — Other Ambulatory Visit: Payer: Self-pay | Admitting: Medical

## 2016-02-10 DIAGNOSIS — R928 Other abnormal and inconclusive findings on diagnostic imaging of breast: Secondary | ICD-10-CM

## 2016-02-14 DIAGNOSIS — M2480 Other specific joint derangements of unspecified joint, not elsewhere classified: Secondary | ICD-10-CM | POA: Diagnosis not present

## 2016-02-14 DIAGNOSIS — M357 Hypermobility syndrome: Secondary | ICD-10-CM | POA: Diagnosis not present

## 2016-02-14 DIAGNOSIS — M79671 Pain in right foot: Secondary | ICD-10-CM | POA: Diagnosis not present

## 2016-02-18 ENCOUNTER — Ambulatory Visit
Admission: RE | Admit: 2016-02-18 | Discharge: 2016-02-18 | Disposition: A | Discharge status: Home / Self Care | Payer: BLUE CROSS/BLUE SHIELD | Source: Ambulatory Visit | Attending: Medical | Admitting: Medical

## 2016-02-18 DIAGNOSIS — R928 Other abnormal and inconclusive findings on diagnostic imaging of breast: Secondary | ICD-10-CM

## 2016-02-18 DIAGNOSIS — R921 Mammographic calcification found on diagnostic imaging of breast: Secondary | ICD-10-CM | POA: Diagnosis not present

## 2016-02-23 ENCOUNTER — Ambulatory Visit (INDEPENDENT_AMBULATORY_CARE_PROVIDER_SITE_OTHER): Payer: BLUE CROSS/BLUE SHIELD | Admitting: Podiatry

## 2016-02-23 ENCOUNTER — Ambulatory Visit (INDEPENDENT_AMBULATORY_CARE_PROVIDER_SITE_OTHER): Payer: BLUE CROSS/BLUE SHIELD

## 2016-02-23 ENCOUNTER — Encounter: Payer: Self-pay | Admitting: Podiatry

## 2016-02-23 VITALS — BP 145/88 | HR 78 | Resp 16

## 2016-02-23 DIAGNOSIS — M2140 Flat foot [pes planus] (acquired), unspecified foot: Secondary | ICD-10-CM

## 2016-02-23 DIAGNOSIS — M79672 Pain in left foot: Secondary | ICD-10-CM

## 2016-02-23 DIAGNOSIS — M79671 Pain in right foot: Secondary | ICD-10-CM | POA: Diagnosis not present

## 2016-02-23 DIAGNOSIS — M779 Enthesopathy, unspecified: Secondary | ICD-10-CM | POA: Diagnosis not present

## 2016-02-23 MED ORDER — TRIAMCINOLONE ACETONIDE 10 MG/ML IJ SUSP
10.0000 mg | Freq: Once | INTRAMUSCULAR | Status: AC
  Administered 2016-02-23: 10 mg

## 2016-02-23 NOTE — Progress Notes (Signed)
   Subjective:    Patient ID: Angel Morris, female    DOB: [birth year], 40 y.o.   MRN: [id number]  HPI    Review of Systems  All other systems reviewed and are negative.      Objective:   Physical Exam        Assessment & Plan:

## 2016-02-24 ENCOUNTER — Telehealth: Payer: Self-pay | Admitting: *Deleted

## 2016-02-24 MED ORDER — TRAMADOL HCL 50 MG PO TABS: 50.0000 mg | ORAL_TABLET | Freq: Three times a day (TID) | ORAL | Status: DC | PRN

## 2016-02-24 NOTE — Progress Notes (Signed)
Subjective:     Patient ID: Angel Morris, female   DOB: Sep 04, 1975, 40 y.o.   MRN: [id number]  HPI patient presents with 4 month history of pain in the right medial ankle and states that she no she has flatfeet. She was doing quite a bit of walking at the time and did not have a mat when she was at work   Review of Systems  All other systems reviewed and are negative.      Objective:   Physical Exam  Constitutional: She is oriented to person, place, and time.  Cardiovascular: Intact distal pulses.   Musculoskeletal: Normal range of motion.  Neurological: She is oriented to person, place, and time.  Skin: Skin is warm.  Nursing note and vitals reviewed.  neurovascular status intact with muscle strength adequate and range of motion within normal limits. Patient's noted to have acute inflammation and pain in the medial aspect of the right ankle with fluid buildup along the tendon complex and significant flatfoot deformity noted. Patient's found to have range of motion that was adequate except for the pain on the medial side and I did check muscle strength of posterior tib and found to be intact at the current time. 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 Today I went ahead and did a careful sheath injection right and applied brace to lift the arch up. I discussed about the possibility for cast immobilization and if improvement is not occurring that we may need to get an MRI of this. I also discussed long-term orthotics which will be necessary due to the chronic nature of this condition and the arch structure  X-ray report indicated significant flattening of the arch bilateral with depression both right and left foot

## 2016-02-24 NOTE — Telephone Encounter (Addendum)
Pt states she was seen yesterday and got a shot and is in a lot of pain. I instructed pt to ice 3-4 times daily for 10-15 mins each time, and I would ask Dr. Paulla Dolly for pain medication. Dr. Paulla Dolly ordered Tramadol 50mg  #30 one tablet every 8 hours prn foot pain. Orders called to [facility]. 03/09/2016-[facility] [place] AUTHORIZED MRI [id number] RIGHT ANKLE, ORDER ID:  [id number], VALID 03/09/2016 - 05/07/2016. FAXED TO [facility]. 04/24/2016-Pt request MRI results.

## 2016-02-26 ENCOUNTER — Telehealth: Payer: Self-pay | Admitting: Medical

## 2016-02-26 NOTE — Telephone Encounter (Signed)
P.A. CONTRAVE  

## 2016-03-01 ENCOUNTER — Ambulatory Visit: Payer: Self-pay | Admitting: Adult Health

## 2016-03-02 NOTE — Telephone Encounter (Signed)
P.A. Approved by phone at [phone] for 12 more weeks, pt informed

## 2016-03-06 ENCOUNTER — Encounter: Payer: Self-pay | Admitting: Adult Health

## 2016-03-06 ENCOUNTER — Ambulatory Visit (INDEPENDENT_AMBULATORY_CARE_PROVIDER_SITE_OTHER): Payer: BLUE CROSS/BLUE SHIELD | Admitting: Adult Health

## 2016-03-06 VITALS — BP 142/90 | HR 56 | Ht 64.5 in | Wt 176.0 lb

## 2016-03-06 DIAGNOSIS — B9689 Other specified bacterial agents as the cause of diseases classified elsewhere: Secondary | ICD-10-CM | POA: Insufficient documentation

## 2016-03-06 DIAGNOSIS — R3915 Urgency of urination: Secondary | ICD-10-CM | POA: Diagnosis not present

## 2016-03-06 DIAGNOSIS — N760 Acute vaginitis: Secondary | ICD-10-CM

## 2016-03-06 DIAGNOSIS — N898 Other specified noninflammatory disorders of vagina: Secondary | ICD-10-CM | POA: Diagnosis not present

## 2016-03-06 DIAGNOSIS — A499 Bacterial infection, unspecified: Secondary | ICD-10-CM | POA: Diagnosis not present

## 2016-03-06 LAB — POCT WET PREP (WET MOUNT): Clue Cells Wet Prep Whiff POC: NEGATIVE

## 2016-03-06 LAB — POCT URINALYSIS DIPSTICK
NITRITE UA: NEGATIVE
Protein, UA: NEGATIVE
RBC UA: NEGATIVE

## 2016-03-06 MED ORDER — METRONIDAZOLE 500 MG PO TABS: 500.0000 mg | ORAL_TABLET | Freq: Three times a day (TID) | ORAL | Status: DC

## 2016-03-06 NOTE — Patient Instructions (Signed)
Bacterial Vaginosis Bacterial vaginosis is a vaginal infection that occurs when the normal balance of bacteria in the vagina is disrupted. It results from an overgrowth of certain bacteria. This is the most common vaginal infection in women of childbearing age. Treatment is important to prevent complications, especially in pregnant women, as it can cause a premature delivery. CAUSES  Bacterial vaginosis is caused by an increase in harmful bacteria that are normally present in smaller amounts in the vagina. Several different kinds of bacteria can cause bacterial vaginosis. However, the reason that the condition develops is not fully understood. RISK FACTORS Certain activities or behaviors can put you at an increased risk of developing bacterial vaginosis, including:  Having a new sex partner or multiple sex partners.  Douching.  Using an intrauterine device (IUD) for contraception. Women do not get bacterial vaginosis from toilet seats, bedding, swimming pools, or contact with objects around them. SIGNS AND SYMPTOMS  Some women with bacterial vaginosis have no signs or symptoms. Common symptoms include:  Grey vaginal discharge.  A fishlike odor with discharge, especially after sexual intercourse.  Itching or burning of the vagina and vulva.  Burning or pain with urination. DIAGNOSIS  Your health care provider will take a medical history and examine the vagina for signs of bacterial vaginosis. A sample of vaginal fluid may be taken. Your health care provider will look at this sample under a microscope to check for bacteria and abnormal cells. A vaginal pH test may also be done.  TREATMENT  Bacterial vaginosis may be treated with antibiotic medicines. These may be given in the form of a pill or a vaginal cream. A second round of antibiotics may be prescribed if the condition comes back after treatment. Because bacterial vaginosis increases your risk for sexually transmitted diseases, getting  treated can help reduce your risk for chlamydia, gonorrhea, HIV, and herpes. HOME CARE INSTRUCTIONS   Only take over-the-counter or prescription medicines as directed by your health care provider.  If antibiotic medicine was prescribed, take it as directed. Make sure you finish it even if you start to feel better.  Tell all sexual partners that you have a vaginal infection. They should see their health care provider and be treated if they have problems, such as a mild rash or itching.  During treatment, it is important that you follow these instructions:  Avoid sexual activity or use condoms correctly.  Do not douche.  Avoid alcohol as directed by your health care provider.  Avoid breastfeeding as directed by your health care provider. SEEK MEDICAL CARE IF:   Your symptoms are not improving after 3 days of treatment.  You have increased discharge or pain.  You have a fever. MAKE SURE YOU:   Understand these instructions.  Will watch your condition.  Will get help right away if you are not doing well or get worse. FOR MORE INFORMATION  Centers for Disease Control and Prevention, Division of STD Prevention: Appraiser[employer]raud.[employer] American Sexual Health Association (ASHA): www.ashastd.org    This information is not intended to replace advice given to you by your health care provider. Make sure you discuss any questions you have with your health care provider.   Document Released: 08/14/2005 Document Revised: 09/04/2014 Document Reviewed: 03/26/2013 Elsevier Interactive Patient Education 2016 [employer]. No alcohol No sex during treatment Return in 4 weeks for pap and physical

## 2016-03-06 NOTE — Progress Notes (Signed)
Subjective:     Patient ID: Angel Morris, female   DOB: 04-30-1976, 40 y.o.   MRN: [id number]  HPI Angel Morris is a 40 year old black female in complaining of vaginal irritation and urinary urgency.Has musky odor in vagina too. Denies any new products, or partners, has been in pool, and working 12 hours.   Review of Systems Patient denies any headaches, hearing loss, fatigue, blurred vision, shortness of breath, chest pain, abdominal pain, problems with bowel movements,or intercourse. No joint pain or mood swings.See HPI for positives. Reviewed past medical,surgical, social and family history. Reviewed medications and allergies.     Objective:   Physical Exam BP 142/90 mmHg  Pulse 56  Ht 5' 4.5" (1.638 m)  Wt 176 lb (79.833 kg)  BMI 29.75 kg/m2urine dipstick trace leuks, Skin warm and dry.Pelvic: external genitalia is normal in appearance no lesions, vagina: gray discharge with odor,urethra has no lesions or masses noted, cervix:smooth and bulbous, uterus: normal size, shape and contour, non tender, no masses felt, adnexa: no masses or tenderness noted. Bladder is non tender and no masses felt. Abdomen is soft and non tender. Wet prep: + for clue cells and +WBCs. She declines GC/CHL.    Assessment:     Vaginal irritation Vaginal discharge BV Urinary urgency     Plan:    Eat with flagyl and push fluids   Rx flagyl 500 mg 1 tid x 7 days, no alcohol, review handout on BV No sex during treatment  Return in 4 weeks for pap and physical

## 2016-03-08 ENCOUNTER — Ambulatory Visit (INDEPENDENT_AMBULATORY_CARE_PROVIDER_SITE_OTHER): Payer: BLUE CROSS/BLUE SHIELD | Admitting: Podiatry

## 2016-03-08 ENCOUNTER — Encounter: Payer: Self-pay | Admitting: Podiatry

## 2016-03-08 DIAGNOSIS — T148 Other injury of unspecified body region: Secondary | ICD-10-CM

## 2016-03-08 DIAGNOSIS — T148XXA Other injury of unspecified body region, initial encounter: Secondary | ICD-10-CM

## 2016-03-08 DIAGNOSIS — M779 Enthesopathy, unspecified: Secondary | ICD-10-CM

## 2016-03-08 NOTE — Progress Notes (Signed)
Subjective:     Patient ID: Angel Morris, female   DOB: May 16, 1976, 40 y.o.   MRN: [id number]  HPI patient presents stating she still in a lot of pain in her right ankle and the medication did not seem to help and the brace is not making a big difference   Review of Systems     Objective:   Physical Exam Neurovascular status intact muscle strength adequate with continued discomfort around posterior tibial tendon as it inserts into the navicular and slightly proximal. Patient still has fusiform swelling around this area and was difficult to gauge the function of the tendon due to pain    Assessment:     Posterior tibial tendinitis right with possibility for tendon tear or interstitial tear with flatfoot deformity    Plan:     H&P condition reviewed and went ahead today and placed in an air fracture walker to immobilize. We are getting a MRI to rule out tear of the tendon

## 2016-03-21 ENCOUNTER — Other Ambulatory Visit: Payer: Self-pay | Admitting: Medical

## 2016-03-26 ENCOUNTER — Ambulatory Visit
Admission: RE | Admit: 2016-03-26 | Discharge: 2016-03-26 | Disposition: A | Discharge status: Home / Self Care | Payer: BLUE CROSS/BLUE SHIELD | Source: Ambulatory Visit | Attending: Podiatry | Admitting: Podiatry

## 2016-03-26 DIAGNOSIS — M779 Enthesopathy, unspecified: Secondary | ICD-10-CM

## 2016-03-26 DIAGNOSIS — T148XXA Other injury of unspecified body region, initial encounter: Secondary | ICD-10-CM

## 2016-03-26 DIAGNOSIS — M65871 Other synovitis and tenosynovitis, right ankle and foot: Secondary | ICD-10-CM | POA: Diagnosis not present

## 2016-03-29 ENCOUNTER — Telehealth: Payer: Self-pay

## 2016-03-29 ENCOUNTER — Encounter: Payer: BLUE CROSS/BLUE SHIELD | Admitting: Medical

## 2016-03-29 NOTE — Telephone Encounter (Signed)
Note in chart that pt overslept and rescheduled

## 2016-03-29 NOTE — Telephone Encounter (Signed)
D

## 2016-03-29 NOTE — Telephone Encounter (Signed)
This patient no showed for their appointment today.Which of the following is necessary for this patient.   A) No follow-up necessary   B) Follow-up urgent. Locate Patient Immediately.   C) Follow-up necessary. Contact patient and Schedule visit in ____ Days.   D) Follow-up Advised. Contact patient and Schedule visit in ____ Days. 

## 2016-04-04 ENCOUNTER — Ambulatory Visit (INDEPENDENT_AMBULATORY_CARE_PROVIDER_SITE_OTHER): Payer: BLUE CROSS/BLUE SHIELD | Admitting: Adult Health

## 2016-04-04 ENCOUNTER — Encounter: Payer: Self-pay | Admitting: Adult Health

## 2016-04-04 VITALS — BP 146/90 | HR 61 | Ht 65.0 in | Wt 176.0 lb

## 2016-04-04 DIAGNOSIS — Z01411 Encounter for gynecological examination (general) (routine) with abnormal findings: Secondary | ICD-10-CM | POA: Diagnosis not present

## 2016-04-04 DIAGNOSIS — Z1211 Encounter for screening for malignant neoplasm of colon: Secondary | ICD-10-CM | POA: Diagnosis not present

## 2016-04-04 DIAGNOSIS — N898 Other specified noninflammatory disorders of vagina: Secondary | ICD-10-CM

## 2016-04-04 DIAGNOSIS — Z3009 Encounter for other general counseling and advice on contraception: Secondary | ICD-10-CM | POA: Diagnosis not present

## 2016-04-04 DIAGNOSIS — I10 Essential (primary) hypertension: Secondary | ICD-10-CM | POA: Diagnosis not present

## 2016-04-04 DIAGNOSIS — Z3041 Encounter for surveillance of contraceptive pills: Secondary | ICD-10-CM

## 2016-04-04 DIAGNOSIS — Z01419 Encounter for gynecological examination (general) (routine) without abnormal findings: Secondary | ICD-10-CM

## 2016-04-04 LAB — HEMOCCULT GUIAC POC 1CARD (OFFICE): Fecal Occult Blood, POC: NEGATIVE

## 2016-04-04 MED ORDER — NORETHIN-ETH ESTRAD-FE BIPHAS 1 MG-10 MCG / 10 MCG PO TABS: 1.0000 | ORAL_TABLET | Freq: Every day | ORAL | 11 refills | Status: DC

## 2016-04-04 NOTE — Progress Notes (Signed)
Patient ID: Angel Morris, female   DOB: 1976-04-13, 40 y.o.   MRN: [id number] History of Present Illness:  Angel Morris is a 40 year old black female in for a well woman gyn exam, she had a normal pap 03/15/15,she was treated for BV 7/10 and still notices some vaginal irritation and and musky smell at times. She is working 12 hours and has not taken BP meds yet. PCP is Dr Dorothea Ogle.  Current Medications, Allergies, Past Medical History, Past Surgical History, Family History and Social History were reviewed in [employer] record.     Review of Systems: Patient denies any headaches, hearing loss, fatigue, blurred vision, shortness of breath, chest pain, abdominal pain, problems with bowel movements, urination, or intercourse. No joint pain or mood swings. +vaginal irritation and odor   Physical Exam:BP (!) 146/90 (BP Location: Left Arm, Patient Position: Sitting, Cuff Size: Normal)   Pulse 61   Ht 5\' 5"  (1.651 m)   Wt 176 lb (79.8 kg)   BMI 29.29 kg/m  General:  Well developed, well nourished, no acute distress Skin:  Warm and dry Neck:  Midline trachea, normal thyroid, good ROM, no lymphadenopathy Lungs; Clear to auscultation bilaterally Breast:  No dominant palpable mass, retraction, or nipple discharge Cardiovascular: Regular rate and rhythm Abdomen:  Soft, non tender, no hepatosplenomegaly Pelvic:  External genitalia is normal in appearance, no lesions.  The vagina is normal in appearance,has white normal appearing discharge without odor. Urethra has no lesions or masses. The cervix is bulbous. GC/CHL obtained. Uterus is felt to be normal size, shape, and contour.  No adnexal masses or tenderness noted.Bladder is non tender, no masses felt. Rectal: Good sphincter tone, no polyps, or hemorrhoids felt.  Hemoccult negative. Extremities/musculoskeletal:  No swelling or varicosities noted, no clubbing or cyanosis Psych:  No mood changes, alert and cooperative,seems  happy   Impression: Well woman gyn exam no pap Family planning Contraceptive management Vaginal irritation Hypertension     Plan: Will change OCs to lower dose,Rx lo loestrin dips 1 pack take 1 daily with 11 refills, finish current pack then start, use condoms Check HIV, RPR and HSV 2 Physical in 1 year, pap in 2019 Mammogram yearly Take BP meds Bath with bar soap and water and can use RePhresh if desired  Follow up with Dr Glade Lloyd for labs and BP

## 2016-04-04 NOTE — Patient Instructions (Signed)
Finish current pills Start lo loestrin  and use condoms Physical in 1 year  Take BP meds

## 2016-04-05 ENCOUNTER — Telehealth: Payer: Self-pay | Admitting: Adult Health

## 2016-04-05 ENCOUNTER — Encounter: Payer: Self-pay | Admitting: Adult Health

## 2016-04-05 DIAGNOSIS — B009 Herpesviral infection, unspecified: Secondary | ICD-10-CM

## 2016-04-05 HISTORY — DX: Herpesviral infection, unspecified: B00.9

## 2016-04-05 LAB — RPR: RPR Ser Ql: NONREACTIVE

## 2016-04-05 LAB — HIV ANTIBODY (ROUTINE TESTING W REFLEX): HIV Screen 4th Generation wRfx: NONREACTIVE

## 2016-04-05 LAB — HSV 2 ANTIBODY, IGG: HSV 2 Glycoprotein G Ab, IgG: 7.09 index — ABNORMAL HIGH (ref 0.00–0.90)

## 2016-04-05 NOTE — Telephone Encounter (Signed)
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

## 2016-04-06 LAB — GC/CHLAMYDIA PROBE AMP
Chlamydia trachomatis, NAA: NEGATIVE
NEISSERIA GONORRHOEAE BY PCR: NEGATIVE

## 2016-05-12 ENCOUNTER — Ambulatory Visit (INDEPENDENT_AMBULATORY_CARE_PROVIDER_SITE_OTHER): Payer: BLUE CROSS/BLUE SHIELD | Admitting: Medical

## 2016-05-12 VITALS — BP 106/64 | HR 72 | Temp 97.8°F | Wt 175.6 lb

## 2016-05-12 DIAGNOSIS — R110 Nausea: Secondary | ICD-10-CM

## 2016-05-12 DIAGNOSIS — R300 Dysuria: Secondary | ICD-10-CM | POA: Diagnosis not present

## 2016-05-12 DIAGNOSIS — R509 Fever, unspecified: Secondary | ICD-10-CM

## 2016-05-12 DIAGNOSIS — N10 Acute tubulo-interstitial nephritis: Secondary | ICD-10-CM

## 2016-05-12 LAB — BASIC METABOLIC PANEL
BUN: 9 mg/dL (ref 7–25)
CALCIUM: 9 mg/dL (ref 8.6–10.2)
CO2: 25 mmol/L (ref 20–31)
Chloride: 101 mmol/L (ref 98–110)
Creat: 0.95 mg/dL (ref 0.50–1.10)
GLUCOSE: 130 mg/dL — AB (ref 65–99)
Potassium: 4.1 mmol/L (ref 3.5–5.3)
Sodium: 134 mmol/L — ABNORMAL LOW (ref 135–146)

## 2016-05-12 LAB — CBC WITH DIFFERENTIAL/PLATELET
BASOS PCT: 0 %
Basophils Absolute: 0 cells/uL (ref 0–200)
Eosinophils Absolute: 0 cells/uL — ABNORMAL LOW (ref 15–500)
Eosinophils Relative: 0 %
HEMATOCRIT: 36.1 % (ref 35.0–45.0)
HEMOGLOBIN: 12.8 g/dL (ref 11.7–15.5)
Lymphocytes Relative: 4 %
Lymphs Abs: 972 cells/uL (ref 850–3900)
MCH: 28.3 pg (ref 27.0–33.0)
MCHC: 35.5 g/dL (ref 32.0–36.0)
MCV: 79.9 fL — ABNORMAL LOW (ref 80.0–100.0)
MPV: 11.2 fL (ref 7.5–12.5)
Monocytes Absolute: 1458 cells/uL — ABNORMAL HIGH (ref 200–950)
Monocytes Relative: 6 %
Neutro Abs: 21870 cells/uL — ABNORMAL HIGH (ref 1500–7800)
Neutrophils Relative %: 90 %
Platelets: 207 10*3/uL (ref 140–400)
RBC: 4.52 MIL/uL (ref 3.80–5.10)
RDW: 13 % (ref 11.0–15.0)
WBC: 24.3 10*3/uL — AB (ref 4.0–10.5)

## 2016-05-12 LAB — POCT URINALYSIS DIPSTICK
Bilirubin, UA: NEGATIVE
GLUCOSE UA: NEGATIVE
Ketones, UA: NEGATIVE
NITRITE UA: NEGATIVE
Spec Grav, UA: 1.02
UROBILINOGEN UA: 1
pH, UA: 6

## 2016-05-12 LAB — URINALYSIS, MICROSCOPIC ONLY
CRYSTALS: NONE SEEN [HPF]
Casts: NONE SEEN [LPF]
Yeast: NONE SEEN [HPF]

## 2016-05-12 MED ORDER — OXYCODONE-ACETAMINOPHEN 5-325 MG PO TABS: 1.0000 | ORAL_TABLET | Freq: Four times a day (QID) | ORAL | 0 refills | Status: DC | PRN

## 2016-05-12 MED ORDER — CEFTRIAXONE SODIUM 1 G IJ SOLR
1.0000 g | Freq: Once | INTRAMUSCULAR | Status: AC
  Administered 2016-05-12: 1 g via INTRAMUSCULAR

## 2016-05-12 MED ORDER — CIPROFLOXACIN HCL 500 MG PO TABS: 500.0000 mg | ORAL_TABLET | Freq: Two times a day (BID) | ORAL | 0 refills | Status: DC

## 2016-05-12 NOTE — Progress Notes (Signed)
Subjective:  Angel Morris is a 40 y.o. female who complains of possible urinary tract infection.  Accompanied by her adult son.  She has had symptoms for 4 days.  She notes hx/o pyelonephritis in the past, UTI in the past, and has had hospitalization a few times in the past for pyelo.   Hasn't had any problems with urinary tract since 2011.   Has seen Dr. Evans/urology in the past.  She denies any recent precursory issues to get a UTI.  She notes fever, nausea, back and abdominal pain, odor in urine, urinary frequency and urgency.  No vaginal bleeding, no discharge, no vomiting,no diarrhea, no bowel issues, had normal BM this morning.  Gradually the urinary symptoms have worsened in last few days.   Drinking water, cranberry juice.  No recent antibiotics.  No other aggravating or relieving factors. No other complaint.  Past Medical History:  Diagnosis Date  . Abnormal EKG    long QT  . Abnormal pap    lSIL   . Abnormal Pap smear   . Anemia, iron deficiency   . Anxiety   . Asthma    childhood  . BV (bacterial vaginosis) 03/06/2016  . Complication of anesthesia    States "when I had kidney stones removed(Open); I had a build up of fluid in my lungs. But I dont really remember much about it otherwise".  . Contraceptive management 07/28/2013  . Headache(784.0)   . Herpes 04/05/2016  . History of recurrent UTIs   . Human papilloma virus   . Hypertension   . Hypokalemia    being addressed with her PMD  . Kidney stones   . Menorrhagia   . Nephrolithiasis   . Onychomycosis   . Overweight(278.02)   . Proteinuria   . Urinary urgency 03/06/2016  . Uterine fibroid   . Vaginal discharge 03/06/2016  . Vaginal irritation 03/06/2016  . Wears contact lenses     ROS as in subjective  Reviewed allergies, medications, past medical, surgical, and social history.    Objective: Vitals:   05/12/16 1124  BP: 106/64  Pulse: 72  Temp: 97.8 F (36.6 C)    General appearance: alert, no distress,  WD/WN,AA female, lying on the exam table, ill appearing Skin: warm, dry Abdomen: +bs, soft, tender lower abdomen somewhat, otherwise non tender, non distended, no masses, no hepatomegaly, no splenomegaly, no bruits Back: +bilat CVA tenderness GU: deferred Heart RRR, normal s1, s2, no murmurs Lungs clear Ext: no edema Pulses WNL      Assessment: Encounter Diagnoses  Name Primary?  . Acute pyelonephritis Yes  . Burning with urination   . Nausea without vomiting   . Fever, unspecified      Plan: Discussed symptoms, diagnosis, possible complications.  Currently afebrile, vitals ok, and she is hydrating ok.   STAT labs today.  Gave 1 g Rocephin IM in office.  She has Zofran at home she can use.  Begin Cipro right away, pain medication below as needed, rest, continue to push clear fluids.  Diet as tolerated.   Advised if she is worse in the next 48 hours to go to the emergency dept.  If labs come back abnormal, that could change our plan.  For now we will treat as acute uncomplicated pyelonephritis.    She voices understanding and agreement with plan.

## 2016-05-13 ENCOUNTER — Inpatient Hospital Stay (HOSPITAL_COMMUNITY)
Admission: EM | Admit: 2016-05-13 | Discharge: 2016-05-17 | DRG: 690 | Disposition: A | Discharge status: Home / Self Care | Payer: BLUE CROSS/BLUE SHIELD | Attending: Family Medicine | Admitting: Family Medicine

## 2016-05-13 ENCOUNTER — Emergency Department (HOSPITAL_COMMUNITY): Payer: BLUE CROSS/BLUE SHIELD

## 2016-05-13 ENCOUNTER — Encounter (HOSPITAL_COMMUNITY): Payer: Self-pay

## 2016-05-13 DIAGNOSIS — Z79899 Other long term (current) drug therapy: Secondary | ICD-10-CM

## 2016-05-13 DIAGNOSIS — I10 Essential (primary) hypertension: Secondary | ICD-10-CM | POA: Diagnosis not present

## 2016-05-13 DIAGNOSIS — N12 Tubulo-interstitial nephritis, not specified as acute or chronic: Secondary | ICD-10-CM | POA: Diagnosis present

## 2016-05-13 DIAGNOSIS — N136 Pyonephrosis: Principal | ICD-10-CM | POA: Diagnosis present

## 2016-05-13 DIAGNOSIS — Z87440 Personal history of urinary (tract) infections: Secondary | ICD-10-CM

## 2016-05-13 DIAGNOSIS — N132 Hydronephrosis with renal and ureteral calculous obstruction: Secondary | ICD-10-CM | POA: Diagnosis not present

## 2016-05-13 DIAGNOSIS — I959 Hypotension, unspecified: Secondary | ICD-10-CM | POA: Diagnosis not present

## 2016-05-13 DIAGNOSIS — F419 Anxiety disorder, unspecified: Secondary | ICD-10-CM | POA: Diagnosis present

## 2016-05-13 DIAGNOSIS — D509 Iron deficiency anemia, unspecified: Secondary | ICD-10-CM | POA: Diagnosis not present

## 2016-05-13 DIAGNOSIS — E876 Hypokalemia: Secondary | ICD-10-CM | POA: Diagnosis not present

## 2016-05-13 DIAGNOSIS — R509 Fever, unspecified: Secondary | ICD-10-CM | POA: Diagnosis not present

## 2016-05-13 DIAGNOSIS — B9620 Unspecified Escherichia coli [E. coli] as the cause of diseases classified elsewhere: Secondary | ICD-10-CM | POA: Diagnosis present

## 2016-05-13 DIAGNOSIS — N200 Calculus of kidney: Secondary | ICD-10-CM | POA: Diagnosis not present

## 2016-05-13 DIAGNOSIS — Z87442 Personal history of urinary calculi: Secondary | ICD-10-CM

## 2016-05-13 DIAGNOSIS — A4151 Sepsis due to Escherichia coli [E. coli]: Secondary | ICD-10-CM | POA: Diagnosis not present

## 2016-05-13 DIAGNOSIS — Z86001 Personal history of in-situ neoplasm of cervix uteri: Secondary | ICD-10-CM | POA: Diagnosis not present

## 2016-05-13 DIAGNOSIS — R112 Nausea with vomiting, unspecified: Secondary | ICD-10-CM | POA: Diagnosis present

## 2016-05-13 DIAGNOSIS — N281 Cyst of kidney, acquired: Secondary | ICD-10-CM | POA: Diagnosis present

## 2016-05-13 DIAGNOSIS — N179 Acute kidney failure, unspecified: Secondary | ICD-10-CM | POA: Diagnosis not present

## 2016-05-13 DIAGNOSIS — K5900 Constipation, unspecified: Secondary | ICD-10-CM | POA: Diagnosis not present

## 2016-05-13 LAB — URINE MICROSCOPIC-ADD ON

## 2016-05-13 LAB — URINALYSIS, ROUTINE W REFLEX MICROSCOPIC
Bilirubin Urine: NEGATIVE
GLUCOSE, UA: NEGATIVE mg/dL
KETONES UR: NEGATIVE mg/dL
Nitrite: NEGATIVE
PH: 5.5 (ref 5.0–8.0)
Protein, ur: 100 mg/dL — AB
SPECIFIC GRAVITY, URINE: 1.025 (ref 1.005–1.030)

## 2016-05-13 LAB — COMPREHENSIVE METABOLIC PANEL
ALBUMIN: 3.1 g/dL — AB (ref 3.5–5.0)
ALK PHOS: 89 U/L (ref 38–126)
ALT: 16 U/L (ref 14–54)
AST: 22 U/L (ref 15–41)
Anion gap: 11 (ref 5–15)
BUN: 17 mg/dL (ref 6–20)
CALCIUM: 9 mg/dL (ref 8.9–10.3)
CO2: 22 mmol/L (ref 22–32)
CREATININE: 1.32 mg/dL — AB (ref 0.44–1.00)
Chloride: 102 mmol/L (ref 101–111)
GFR calc Af Amer: 58 mL/min — ABNORMAL LOW (ref >60)
GFR calc non Af Amer: 50 mL/min — ABNORMAL LOW (ref >60)
GLUCOSE: 156 mg/dL — AB (ref 65–99)
Potassium: 3.3 mmol/L — ABNORMAL LOW (ref 3.5–5.1)
SODIUM: 135 mmol/L (ref 135–145)
Total Bilirubin: 0.4 mg/dL (ref 0.3–1.2)
Total Protein: 7.2 g/dL (ref 6.5–8.1)

## 2016-05-13 LAB — CBC WITH DIFFERENTIAL/PLATELET
Basophils Absolute: 0 10*3/uL (ref 0.0–0.1)
Basophils Relative: 0 %
EOS PCT: 0 %
Eosinophils Absolute: 0 10*3/uL (ref 0.0–0.7)
HEMATOCRIT: 36.7 % (ref 36.0–46.0)
HEMOGLOBIN: 13 g/dL (ref 12.0–15.0)
LYMPHS ABS: 0.8 10*3/uL (ref 0.7–4.0)
LYMPHS PCT: 3 %
MCH: 28.1 pg (ref 26.0–34.0)
MCHC: 35.4 g/dL (ref 30.0–36.0)
MCV: 79.4 fL (ref 78.0–100.0)
MONOS PCT: 4 %
Monocytes Absolute: 1.1 10*3/uL — ABNORMAL HIGH (ref 0.1–1.0)
NEUTROS ABS: 25 10*3/uL — AB (ref 1.7–7.7)
Neutrophils Relative %: 93 %
Platelets: 167 10*3/uL (ref 150–400)
RBC: 4.62 MIL/uL (ref 3.87–5.11)
RDW: 13.7 % (ref 11.5–15.5)
WBC Morphology: INCREASED
WBC: 26.9 10*3/uL — ABNORMAL HIGH (ref 4.0–10.5)

## 2016-05-13 LAB — I-STAT CG4 LACTIC ACID, ED
Lactic Acid, Venous: 3.24 mmol/L (ref 0.5–1.9)
Lactic Acid, Venous: 1.11 mmol/L (ref 0.5–1.9)

## 2016-05-13 LAB — I-STAT BETA HCG BLOOD, ED (MC, WL, AP ONLY): I-stat hCG, quantitative: <5 m[IU]/mL (ref <5)

## 2016-05-13 MED ORDER — ACETAMINOPHEN 500 MG PO TABS
500.0000 mg | ORAL_TABLET | Freq: Four times a day (QID) | ORAL | Status: DC | PRN
  Administered 2016-05-13 – 2016-05-16 (×6): 500 mg via ORAL
  Filled 2016-05-13 (×7): qty 1

## 2016-05-13 MED ORDER — SODIUM CHLORIDE 0.9 % IV BOLUS (SEPSIS)
1000.0000 mL | Freq: Once | INTRAVENOUS | Status: AC
  Administered 2016-05-13: 1000 mL via INTRAVENOUS

## 2016-05-13 MED ORDER — SODIUM CHLORIDE 0.9 % IV SOLN
INTRAVENOUS | Status: AC
  Administered 2016-05-13: 19:00:00 via INTRAVENOUS

## 2016-05-13 MED ORDER — ACETAMINOPHEN 325 MG PO TABS
650.0000 mg | ORAL_TABLET | Freq: Once | ORAL | Status: AC
  Administered 2016-05-13: 650 mg via ORAL
  Filled 2016-05-13: qty 2

## 2016-05-13 MED ORDER — METOCLOPRAMIDE HCL 5 MG/ML IJ SOLN: 5.0000 mg | Freq: Three times a day (TID) | INTRAMUSCULAR | Status: DC | PRN

## 2016-05-13 MED ORDER — IBUPROFEN 400 MG PO TABS
600.0000 mg | ORAL_TABLET | Freq: Once | ORAL | Status: AC
  Administered 2016-05-13: 600 mg via ORAL
  Filled 2016-05-13: qty 1

## 2016-05-13 MED ORDER — MORPHINE SULFATE (PF) 4 MG/ML IV SOLN
4.0000 mg | Freq: Once | INTRAVENOUS | Status: AC
  Administered 2016-05-13: 4 mg via INTRAVENOUS
  Filled 2016-05-13: qty 1

## 2016-05-13 MED ORDER — SODIUM CHLORIDE 0.9 % IV BOLUS (SEPSIS)
500.0000 mL | Freq: Once | INTRAVENOUS | Status: AC
  Administered 2016-05-13: 500 mL via INTRAVENOUS

## 2016-05-13 MED ORDER — SODIUM CHLORIDE 0.9 % IV BOLUS (SEPSIS): 1000.0000 mL | Freq: Once | INTRAVENOUS | Status: DC

## 2016-05-13 MED ORDER — METOCLOPRAMIDE HCL 5 MG PO TABS: 5.0000 mg | ORAL_TABLET | Freq: Three times a day (TID) | ORAL | Status: DC | PRN

## 2016-05-13 MED ORDER — DEXTROSE 5 % IV SOLN
2.0000 g | Freq: Two times a day (BID) | INTRAVENOUS | Status: DC
  Administered 2016-05-13 – 2016-05-14 (×4): 2 g via INTRAVENOUS
  Filled 2016-05-13 (×6): qty 2

## 2016-05-13 MED ORDER — DEXTROSE 5 % IV SOLN
1.0000 g | Freq: Once | INTRAVENOUS | Status: DC
  Filled 2016-05-13: qty 10

## 2016-05-13 MED ORDER — ENOXAPARIN SODIUM 40 MG/0.4ML ~~LOC~~ SOLN
40.0000 mg | SUBCUTANEOUS | Status: DC
  Administered 2016-05-13 – 2016-05-16 (×4): 40 mg via SUBCUTANEOUS
  Filled 2016-05-13 (×4): qty 0.4

## 2016-05-13 MED ORDER — ONDANSETRON HCL 4 MG/2ML IJ SOLN
4.0000 mg | Freq: Once | INTRAMUSCULAR | Status: AC
  Administered 2016-05-13: 4 mg via INTRAVENOUS
  Filled 2016-05-13: qty 2

## 2016-05-13 MED ORDER — OXYCODONE HCL 5 MG PO TABS
5.0000 mg | ORAL_TABLET | ORAL | Status: DC | PRN
  Administered 2016-05-13 – 2016-05-16 (×10): 5 mg via ORAL
  Filled 2016-05-13 (×10): qty 1

## 2016-05-13 MED ORDER — NORGESTIM-ETH ESTRAD TRIPHASIC 0.18/0.215/0.25 MG-35 MCG PO TABS: 1.0000 | ORAL_TABLET | Freq: Every day | ORAL | Status: DC

## 2016-05-13 MED ORDER — ACETAMINOPHEN 650 MG RE SUPP: 650.0000 mg | Freq: Four times a day (QID) | RECTAL | Status: DC | PRN

## 2016-05-13 NOTE — ED Provider Notes (Signed)
[facility] DEPT Provider Note   CSN: [id number] Arrival date & time: 05/13/16  [id number]     History   Chief Complaint Chief Complaint  Patient presents with  . Fever  . Urinary Tract Infection    HPI Angel Morris is a 40 y.o. female.  HPI   Angel Morris is a 40 y.o. female, with a history of Renal stone, presenting to the ED with right lower back pain and dysuria for the past 6 days. Patient was seen at her PCP office yesterday, diagnosed with acute pyelonephritis, received IM Rocephin, and discharged with Cipro. Patient has had 2 doses of Cipro prior to arrival. Patient states the pain has increased, it is sharp, now present in the bilateral lower back, severe. Also endorses nausea with one episode of vomiting a few days ago along with a fever and chills. Denies abnormal vaginal discharge/bleeding, abdominal pain, diarrhea, neuro deficits, or any other complaints.  Patient used to have a history of recurrent UTIs and renal stones. Patient was seen at that time by Dr. Amalia Hailey, urologist. Patient has a history of open kidney stone removal in 2000. Patient states she has not had a problem with UTIs since 2011.   Past Medical History:  Diagnosis Date  . Abnormal EKG    long QT  . Abnormal pap    lSIL   . Abnormal Pap smear   . Anemia, iron deficiency   . Anxiety   . Asthma    childhood  . BV (bacterial vaginosis) 03/06/2016  . Complication of anesthesia    States "when I had kidney stones removed(Open); I had a build up of fluid in my lungs. But I dont really remember much about it otherwise".  . Contraceptive management 07/28/2013  . Headache(784.0)   . Herpes 04/05/2016  . History of recurrent UTIs   . Human papilloma virus   . Hypertension   . Hypokalemia    being addressed with her PMD  . Kidney stones   . Menorrhagia   . Nephrolithiasis   . Onychomycosis   . Overweight(278.02)   . Proteinuria   . Urinary urgency 03/06/2016  . Uterine fibroid   . Vaginal  discharge 03/06/2016  . Vaginal irritation 03/06/2016  . Wears contact lenses     Patient Active Problem List   Diagnosis Date Noted  . Pyelonephritis 05/13/2016  . Herpes 04/05/2016  . Vaginal irritation 03/06/2016  . Vaginal discharge 03/06/2016  . BV (bacterial vaginosis) 03/06/2016  . Urinary urgency 03/06/2016  . Prolonged Q-T interval on ECG 05/05/2015  . Nonspecific abnormal electrocardiogram (ECG) (EKG) 03/29/2015  . Essential hypertension 03/29/2015  . Routine general medical examination at a health care facility 03/29/2015  . Hypokalemia 03/29/2015  . 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 Contraceptive management 07/28/2013    Past Surgical History:  Procedure Laterality Date  . CERVICAL CONIZATION W/BX N/A 10/08/2013   Procedure: CONIZATION CERVIX WITH BIOPSY;  Surgeon: Florian Buff, MD;  Location: [facility];  Service: Gynecology;  Laterality: N/A;  . DILATION AND CURETTAGE OF UTERUS    . DILITATION & CURRETTAGE/HYSTROSCOPY WITH NOVASURE ABLATION N/A 07/02/2015   Procedure: DILATATION & CURETTAGE/HYSTEROSCOPY WITH NOVASURE ABLATION;  Surgeon: Florian Buff, MD;  Location: [facility];  Service: Gynecology;  Laterality: N/A;  Uterine Cavity Length 5.5 Uterine Cavity Width 3.5 Power 106 Time 1 minute 4 seconds  . HOLMIUM LASER APPLICATION N/A [id number]   Procedure: HOLMIUM LASER APPLICATION;  Surgeon: Florian Buff, MD;  Location: [facility];  Service: Gynecology;  Laterality: N/A;  . kidney stones  june 2000   open removal  . rotary cuff - left shoulder  2005  . ROTATOR CUFF REPAIR Left     OB History    Gravida Para Term Preterm AB Living   7 4     3 4    SAB TAB Ectopic Multiple Live Births   3               Home Medications    Prior to Admission medications   Medication Sig Start Date End Date Taking? Authorizing Provider  acetaminophen (TYLENOL) 325 MG tablet Take 650 mg by mouth every 6 (six) hours as needed for fever (pain).   Yes Historical Provider, MD   ciprofloxacin (CIPRO) 500 MG tablet Take 1 tablet (500 mg total) by mouth 2 (two) times daily. Patient taking differently: Take 500 mg by mouth 2 (two) times daily. 10 day course started 05/12/16 05/12/16  Yes Camelia Eng Tysinger, PA-C  diclofenac (VOLTAREN) 75 MG EC tablet Take 75 mg by mouth 2 (two) times daily.   Yes Historical Provider, MD  ferrous sulfate 325 (65 FE) MG tablet Take 325 mg by mouth 2 (two) times daily as needed (weakness).   Yes Historical Provider, MD  ibuprofen (ADVIL,MOTRIN) 200 MG tablet Take 600 mg by mouth every 6 (six) hours as needed for fever (pain).   Yes Historical Provider, MD  MAGNESIUM PO Take 1 tablet by mouth daily.   Yes Historical Provider, MD  Norgestimate-Ethinyl Estradiol Triphasic (TRI-PREVIFEM) 0.18/0.215/0.25 MG-35 MCG tablet Take 1 tablet by mouth daily.   Yes Historical Provider, MD  oxyCODONE-acetaminophen (ROXICET) 5-325 MG tablet Take 1 tablet by mouth every 6 (six) hours as needed for severe pain. 05/12/16  Yes Camelia Eng Tysinger, PA-C  spironolactone (ALDACTONE) 25 MG tablet Take 25 mg by mouth daily.  02/16/16  Yes Historical Provider, MD  amLODipine (NORVASC) 10 MG tablet TAKE 1 TABLET BY MOUTH DAILY 03/21/16   Camelia Eng Tysinger, PA-C  Norethindrone-Ethinyl Estradiol-Fe Biphas (LO LOESTRIN FE) 1 MG-10 MCG / 10 MCG tablet Take 1 tablet by mouth daily. Take 1 daily by mouth Patient not taking: Reported on 05/13/2016 04/04/16   Estill Dooms, NP    Family History Family History  Problem Relation Age of Onset  . Fibromyalgia Mother   . Arthritis Mother   . Diabetes Mother   . Hypertension Mother   . Diabetes Brother   . Diabetes Father   . Stroke Maternal Grandmother   . Aneurysm Maternal Grandfather   . Stroke Other     Social History Social History  Substance Use Topics  . Smoking status: Never Smoker  . Smokeless tobacco: Never Used  . Alcohol use Yes     Comment: rare     Allergies   Review of patient's allergies indicates no known  allergies.   Review of Systems Review of Systems  Constitutional: Positive for chills, fatigue and fever.  Respiratory: Negative for shortness of breath.   Cardiovascular: Negative for chest pain.  Gastrointestinal: Positive for nausea. Negative for abdominal pain, blood in stool, constipation, diarrhea and vomiting.  Genitourinary: Positive for dysuria.  Musculoskeletal: Positive for back pain.  Neurological: Negative for weakness and numbness.  All other systems reviewed and are negative.    Physical Exam Updated Vital Signs BP 143/87   Pulse (!) 127   Temp 102.8 F (39.3 C) (Oral)   Resp 24   SpO2 97%  Physical Exam  Constitutional: She appears well-developed and well-nourished. No distress.  HENT:  Head: Normocephalic and atraumatic.  Eyes: Conjunctivae are normal.  Neck: Neck supple.  Cardiovascular: Regular rhythm, normal heart sounds and intact distal pulses.  Tachycardia present.   Pulmonary/Chest: Effort normal and breath sounds normal. Tachypnea noted. No respiratory distress.  Abdominal: Soft. There is no tenderness. There is CVA tenderness (bilateral, but much worse on right). There is no guarding.  Musculoskeletal: She exhibits no edema or tenderness.  Lymphadenopathy:    She has no cervical adenopathy.  Neurological: She is alert.  Skin: Skin is warm and dry. She is not diaphoretic.  Psychiatric: She has a normal mood and affect. Her behavior is normal.  Nursing note and vitals reviewed.    ED Treatments / Results  Labs (all labs ordered are listed, but only abnormal results are displayed) Labs Reviewed  COMPREHENSIVE METABOLIC PANEL - Abnormal; Notable for the following:       Result Value   Potassium 3.3 (*)    Glucose, Bld 156 (*)    Creatinine, Ser 1.32 (*)    Albumin 3.1 (*)    GFR calc non Af Amer 50 (*)    GFR calc Af Amer 58 (*)    All other components within normal limits  CBC WITH DIFFERENTIAL/PLATELET - Abnormal; Notable for the  following:    WBC 26.9 (*)    Neutro Abs 25.0 (*)    Monocytes Absolute 1.1 (*)    All other components within normal limits  URINALYSIS, ROUTINE W REFLEX MICROSCOPIC (NOT AT [facility]) - Abnormal; Notable for the following:    Color, Urine AMBER (*)    APPearance TURBID (*)    Hgb urine dipstick MODERATE (*)    Protein, ur 100 (*)    Leukocytes, UA LARGE (*)    All other components within normal limits  URINE MICROSCOPIC-ADD ON - Abnormal; Notable for the following:    Squamous Epithelial / LPF 6-30 (*)    Bacteria, UA RARE (*)    All other components within normal limits  I-STAT CG4 LACTIC ACID, ED - Abnormal; Notable for the following:    Lactic Acid, Venous 3.24 (*)    All other components within normal limits  CULTURE, BLOOD (ROUTINE X 2)  CULTURE, BLOOD (ROUTINE X 2)  URINE CULTURE  I-STAT BETA HCG BLOOD, ED ([facility], [facility], [facility] ONLY)  I-STAT CG4 LACTIC ACID, ED    EKG  EKG Interpretation  Date/Time:  Saturday May 13 2016 09:44:08 EDT Ventricular Rate:  120 PR Interval:    QRS Duration: 88 QT Interval:  332 QTC Calculation: 470 R Axis:   53 Text Interpretation:  Sinus tachycardia Otherwise no significant change Abnormal ekg Confirmed by Audie Pinto  MD, ROBERT ([id number]) on 05/13/2016 10:26:31 AM       Radiology Ct Renal Stone Study  Result Date: 05/13/2016 CLINICAL DATA:  40 year old female with bilateral flank and abdominal pain for 1 week, right greater than left. EXAM: CT ABDOMEN AND PELVIS WITHOUT CONTRAST TECHNIQUE: Multidetector CT imaging of the abdomen and pelvis was performed following the standard protocol without IV contrast. COMPARISON:  07/26/2006 CT FINDINGS: Please note that parenchymal abnormalities may be missed without intravenous contrast. Lower chest: Mild bibasilar atelectasis identified. Hepatobiliary: The liver is unremarkable. Multiple calcified gallstones are noted without CT evidence of acute cholecystitis. There is no evidence of biliary dilatation.  Pancreas: Unremarkable Spleen: Unremarkable Adrenals/Urinary Tract: A duplicated right renal collecting system is noted with hydronephrosis of the  upper moiety, slightly increased since 2007. The right ureter is duplicated at least proximally. No definite obstructing calculus or cause identified. Mild enhancement of the right ureteral wall may represent inflammation or infection. Multiple nonobstructing bilateral renal calculi are identified. A probable left renal cyst is present. The adrenal glands and bladder are unremarkable. Stomach/Bowel: Unremarkable. No bowel obstruction or definite bowel wall thickening. Vascular/Lymphatic: Unremarkable. No abdominal aortic aneurysm or enlarged lymph nodes. Reproductive: Unremarkable except for a 3.5 cm benign-appearing left ovarian cyst. Other: No free fluid, focal collection or pneumoperitoneum. Musculoskeletal: No acute or suspicious abnormality. 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 No definite obstructing cause identified. Mild enhancement of the right ureteral wall may represent inflammation or infection. Multiple nonobstructing bilateral renal calculi. Cholelithiasis without CT evidence of acute cholecystitis. Electronically Signed   By: Margarette Canada M.D.   On: 05/13/2016 12:12    Procedures Procedures (including critical care time)  Medications Ordered in ED Medications  ceFEPIme (MAXIPIME) 2 g in dextrose 5 % 50 mL IVPB (0 g Intravenous Stopped 05/13/16 1207)  ibuprofen (ADVIL,MOTRIN) tablet 600 mg (600 mg Oral Given 05/13/16 1013)  morphine 4 MG/ML injection 4 mg (4 mg Intravenous Given 05/13/16 1012)  ondansetron (ZOFRAN) injection 4 mg (4 mg Intravenous Given 05/13/16 1013)  sodium chloride 0.9 % bolus 1,000 mL (0 mLs Intravenous Stopped 05/13/16 1136)  acetaminophen (TYLENOL) tablet 650 mg (650 mg Oral Given 05/13/16 1023)  sodium chloride 0.9 % bolus 1,000 mL (0 mLs  Intravenous Stopped 05/13/16 1207)    And  sodium chloride 0.9 % bolus 500 mL (500 mLs Intravenous New Bag/Given 05/13/16 1136)     Initial Impression / Assessment and Plan / ED Course  I have reviewed the triage vital signs and the nursing notes.  Pertinent labs & imaging results that were available during my care of the patient were reviewed by me and considered in my medical decision making (see chart for details).  Clinical Course   Patient with previously diagnosed pyelonephritis, now presents with worsening symptoms and failure of previous therapy.  Findings and plan of care discussed with Leonard Schwartz, MD. Dr. Audie Pinto personally evaluated and examined this patient.   Patient's presentation gives concern for septic pyelonephritis. The lack of response to previous treatment may be due to the presence of a renal stone or a resistant organism.  10:29 AM Spoke with Janett Billow, pharmacist, who recommends Cefepime for this patient to allow for broader coverage. Carbapenem if we suspect ESBL infection. This recommendation was made in consult with the ID pharmacist. I do not have any reason to suspect ESBL infection at this time. 12:35 PM Spoke with Janeal Holmes, Family Medicine resident coverage, who agreed to see and admit the patient to [place] observation under attending Todd McDiarmid.   Vitals:   05/13/16 0947 05/13/16 0950 05/13/16 1000 05/13/16 1011  BP: 132/76  137/75   Pulse: 119  115   Resp: (!) 35  (!) 35   Temp:      TempSrc:      SpO2: 100%  99%   Weight:    79.4 kg  Height:  5' 4.5" (1.638 m)  5' 4.5" (1.638 m)   Vitals:   05/13/16 1130 05/13/16 1205 05/13/16 1215 05/13/16 1230  BP: 93/55 107/64 108/64 105/63  Pulse: 98 91 91 86  Resp:  (!) 28 26 25   Temp:      TempSrc:      SpO2:  99% 98% 99% 98%  Weight:      Height:         Final Clinical Impressions(s) / ED Diagnoses   Final diagnoses:  Pyelonephritis    New Prescriptions New Prescriptions   No  medications on file     Lorayne Bender, PA-C 05/13/16 [place], PA-C 05/13/16 1255    Leonard Schwartz, MD 05/14/16 [phone]

## 2016-05-13 NOTE — ED Triage Notes (Addendum)
Patient complains of ongoing back and dysuria discomfort  With chills and fever since last Sunday. Currently taking cipro and received Rocephin IM yesterday for same. Today increased chills and back pain. Alert and oriented, had percocet this am with 325mg  tylenol in same at 0730. Patient arrived with heavy pullover, blankets and gloves on, all removed on arrival

## 2016-05-13 NOTE — ED Notes (Signed)
Pt transferred upstairs by Dorothea Ogle, EMT

## 2016-05-13 NOTE — Progress Notes (Signed)
Pharmacy Antibiotic Note  Angel Morris is a 40 y.o. female admitted on 05/13/2016 with sepsis.  Pharmacy has been consulted for Cefepime dosing.  Plan: Cefepime 2g IV every 12 hours - first dose now.  Monitor culture results, clinical status, and renal function. Narrow therapy as able.   Height: 5' 4.5" (163.8 cm) Weight: 175 lb (79.4 kg) IBW/kg (Calculated) : 55.85  Temp (24hrs), Avg:100.3 F (37.9 C), Min:97.8 F (36.6 C), Max:102.8 F (39.3 C)   Recent Labs Lab 05/12/16 1112 05/13/16 0947 05/13/16 1001  WBC 24.3* 26.9*  --   CREATININE 0.95  --   --   LATICACIDVEN  --   --  3.24*    Estimated Creatinine Clearance: 81.1 mL/min (by C-G formula based on SCr of 0.95 mg/dL).    No Known Allergies  Antimicrobials this admission: 9/16 Cefepime >>  Dose adjustments this admission: na  Microbiology results: 9/16 BCx:  9/16 UCx:   Thank you for allowing pharmacy to be a part of this patient's care.  Sloan Leiter, PharmD, BCPS Clinical Pharmacist [phone] 05/13/2016 10:34 AM

## 2016-05-13 NOTE — H&P (Signed)
[facility] Admission History and Physical Service Pager: [phone]  Patient name: Angel Morris Medical record number: [id number] Date of birth: October 15, 1975 Age: 40 y.o. Gender: female  Primary Care Provider: Crisoforo Oxford, PA-C Consultants: None Code Status: Full Code  Chief Complaint: Pyelonephritis  Assessment and Plan: Angel Morris is a 40 y.o. female presenting with pyelonephritis. PMH is significant for iron deficiency anemia, some fibroids and menorrhagia, and remote nephrolithiasis requiring surgical removal of a staghorn calculus in 2000 and recurrent UTIs for a few years but none she can recall since 2011.   Pyelonephritis: Her infection has now failed outpatient therapy due to complicated with hypotension in 90s/50s, tachycardia to 120s, fever to 102.8, and nausea/vomiting limiting her fluid intake. Her symptoms are improving with 2.5 liters of IVF, antibiotics, opiates, antiemetics and antipyretics. Leukocytosis to 26.9 is highly elevated and this point secondary bacteremia needs to be excluded as well. 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 Enhancement of the right ureter is consistent with this ascending infection. There are multiple stones present bilaterally. -Urine Cx, Blood Cx -Continue normal saline 181mL/hr overnight -Cefepime 2g IV q12hrs -Reglan PO or IV PRN for nausea/vomiting (Patient with reported history of QTc prolongation) -Oxycodone 5-325mg  q4hrs PRN for pain  Acute kidney injury: Mild SCr elevation most likely pre-renal with her hypotension and volume depletion at presentation. There may also be a component of direct injury from infection/inflammation. -Hold further NSAIDs with mild AKI -Follow up repeat labs in AM  Hypertension: Aldactone and amlodipine held on admission for hypotension with  severe systemic inflammation.  FEN/GI: Normal diet as tolerating Prophylaxis: [place] enoxaparin  Disposition: Anticipate discharge to home in 1-3 days  History of Present Illness:  Angel Morris is a 40 y.o. female presenting with dysuria, flank pain, fever, and nausea. She had been feeling at her baseline health until Sunday 9/10 when she started to have symptoms of dysuria and urgency. This continued until Wednesday 9/13 when she started to have nausea with 1 episode of vomiting. Symptoms progressed to inclue lower back pain and fever by Friday 9/14 prompting her to see medical attention at clinic where she received one dose of IM ceftriaxone and was prescribed ciprofloxacin for an uncomplicated pyelonephritis. However today her symptoms have worsened to include tachycardia and fevers plus worsened nausea so she returned to the ED for evaluation. She reports cloudy urine and urgency without any gross hematuria. Her back hurts R > L but both sides now. In the ED an abdominal CT was obtained showing no obstructive stones. Blood and urine cultures were drawn and started with IV fluids, parenteral antibiotics, and analgesics.  Review Of Systems: Per HPI with the following additions: She denies any diarrhea, denies cough or dyspnea, denies any chest pain  Otherwise the remainder of the systems were negative.  Patient Active Problem List   Diagnosis Date Noted  . Pyelonephritis 05/13/2016  . Herpes 04/05/2016  . Vaginal irritation 03/06/2016  . Vaginal discharge 03/06/2016  . BV (bacterial vaginosis) 03/06/2016  . Urinary urgency 03/06/2016  . Prolonged Q-T interval on ECG 05/05/2015  . Nonspecific abnormal electrocardiogram (ECG) (EKG) 03/29/2015  . Essential hypertension 03/29/2015  . Routine general medical examination at a health care facility 03/29/2015  . Hypokalemia 03/29/2015  . 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 Contraceptive management 07/28/2013    Past  Medical  History: Past Medical History:  Diagnosis Date  . Abnormal EKG    long QT  . Abnormal pap    lSIL   . Abnormal Pap smear   . Anemia, iron deficiency   . Anxiety   . Asthma    childhood  . BV (bacterial vaginosis) 03/06/2016  . Complication of anesthesia    States "when I had kidney stones removed(Open); I had a build up of fluid in my lungs. But I dont really remember much about it otherwise".  . Contraceptive management 07/28/2013  . Headache(784.0)   . Herpes 04/05/2016  . History of recurrent UTIs   . Human papilloma virus   . Hypertension   . Hypokalemia    being addressed with her PMD  . Kidney stones   . Menorrhagia   . Nephrolithiasis   . Onychomycosis   . Overweight(278.02)   . Proteinuria   . Urinary urgency 03/06/2016  . Uterine fibroid   . Vaginal discharge 03/06/2016  . Vaginal irritation 03/06/2016  . Wears contact lenses     Past Surgical History: Past Surgical History:  Procedure Laterality Date  . CERVICAL CONIZATION W/BX N/A 10/08/2013   Procedure: CONIZATION CERVIX WITH BIOPSY;  Surgeon: Florian Buff, MD;  Location: [facility];  Service: Gynecology;  Laterality: N/A;  . DILATION AND CURETTAGE OF UTERUS    . DILITATION & CURRETTAGE/HYSTROSCOPY WITH NOVASURE ABLATION N/A 07/02/2015   Procedure: DILATATION & CURETTAGE/HYSTEROSCOPY WITH NOVASURE ABLATION;  Surgeon: Florian Buff, MD;  Location: [facility];  Service: Gynecology;  Laterality: N/A;  Uterine Cavity Length 5.5 Uterine Cavity Width 3.5 Power 106 Time 1 minute 4 seconds  . HOLMIUM LASER APPLICATION N/A [id number]   Procedure: HOLMIUM LASER APPLICATION;  Surgeon: Florian Buff, MD;  Location: [facility];  Service: Gynecology;  Laterality: N/A;  . kidney stones  june 2000   open removal  . rotary cuff - left shoulder  2005  . ROTATOR CUFF REPAIR Left     Social History: Social History  Substance Use Topics  . Smoking status: Never Smoker  . Smokeless tobacco: Never Used  . Alcohol use Yes     Comment:  rare   Additional social history: Mother of 3  Please also refer to relevant sections of EMR.  Family History: Family History  Problem Relation Age of Onset  . Fibromyalgia Mother   . Arthritis Mother   . Diabetes Mother   . Hypertension Mother   . Diabetes Brother   . Diabetes Father   . Stroke Maternal Grandmother   . Aneurysm Maternal Grandfather   . Stroke Other    Allergies and Medications: No Known Allergies No current facility-administered medications on file prior to encounter.    Current Outpatient Prescriptions on File Prior to Encounter  Medication Sig Dispense Refill  . diclofenac (VOLTAREN) 75 MG EC tablet Take 75 mg by mouth 2 (two) times daily.    Marland Kitchen spironolactone (ALDACTONE) 25 MG tablet Take 25 mg by mouth daily.     Marland Kitchen amLODipine (NORVASC) 10 MG tablet TAKE 1 TABLET BY MOUTH DAILY 30 tablet 3    Objective: BP 103/67 (BP Location: Left Arm)   Pulse 61   Temp 97.5 F (36.4 C) (Oral)   Resp 20   Ht 5\' 4"  (1.626 m)   Wt 179 lb 3.7 oz (81.3 kg)   SpO2 99%   BMI 30.77 kg/m  Exam: General: Uncomfortable appearing woman, alert and oriented Eyes: conjunctivae non-injected, no scleral  icterus ENTM: Slightly dry oral mucosa Neck: No cervical LAN, no JVD Cardiovascular: Mild tachycardia, regular rhythm, no murmur Respiratory: CTAB, normal WOB Abdomen: Tender to form palpation mostly towards flanks, nondistended Back: CVA tenderness severely on right, moderate on left MSK: 5/5 strength throughout, no joint deformity or effusion of knees or hands Skin: Hot, dry, intact Neuro: No focal deficits Psych: Alert and oriented x3, pleasant and cooperative  Labs and Imaging: CBC BMET   Recent Labs Lab 05/13/16 0947  WBC 26.9*  HGB 13.0  HCT 36.7  PLT 167    Recent Labs Lab 05/13/16 0947  NA 135  K 3.3*  CL 102  CO2 22  BUN 17  CREATININE 1.32*  GLUCOSE 156*  CALCIUM 9.0     EXAM 9/16: CT ABDOMEN AND PELVIS WITHOUT  CONTRAST  IMPRESSION: 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 No definite obstructing cause identified. Mild enhancement of the right ureteral wall may represent inflammation or infection.  Multiple nonobstructing bilateral renal calculi.  Cholelithiasis without CT evidence of acute cholecystitis.   Collier Salina, MD PGY-II Internal Medicine Resident Pager# [phone] 05/13/2016, 6:03 PM

## 2016-05-14 DIAGNOSIS — Z79899 Other long term (current) drug therapy: Secondary | ICD-10-CM | POA: Diagnosis not present

## 2016-05-14 DIAGNOSIS — N12 Tubulo-interstitial nephritis, not specified as acute or chronic: Secondary | ICD-10-CM

## 2016-05-14 DIAGNOSIS — B9620 Unspecified Escherichia coli [E. coli] as the cause of diseases classified elsewhere: Secondary | ICD-10-CM | POA: Diagnosis present

## 2016-05-14 DIAGNOSIS — R112 Nausea with vomiting, unspecified: Secondary | ICD-10-CM | POA: Diagnosis present

## 2016-05-14 DIAGNOSIS — N200 Calculus of kidney: Secondary | ICD-10-CM | POA: Diagnosis not present

## 2016-05-14 DIAGNOSIS — N136 Pyonephrosis: Secondary | ICD-10-CM | POA: Diagnosis present

## 2016-05-14 DIAGNOSIS — D509 Iron deficiency anemia, unspecified: Secondary | ICD-10-CM | POA: Diagnosis not present

## 2016-05-14 DIAGNOSIS — Z87442 Personal history of urinary calculi: Secondary | ICD-10-CM | POA: Diagnosis not present

## 2016-05-14 DIAGNOSIS — I959 Hypotension, unspecified: Secondary | ICD-10-CM | POA: Diagnosis present

## 2016-05-14 DIAGNOSIS — A4151 Sepsis due to Escherichia coli [E. coli]: Secondary | ICD-10-CM | POA: Diagnosis not present

## 2016-05-14 DIAGNOSIS — Z86001 Personal history of in-situ neoplasm of cervix uteri: Secondary | ICD-10-CM | POA: Diagnosis not present

## 2016-05-14 DIAGNOSIS — F419 Anxiety disorder, unspecified: Secondary | ICD-10-CM | POA: Diagnosis present

## 2016-05-14 DIAGNOSIS — Z87440 Personal history of urinary (tract) infections: Secondary | ICD-10-CM | POA: Diagnosis not present

## 2016-05-14 DIAGNOSIS — R509 Fever, unspecified: Secondary | ICD-10-CM | POA: Diagnosis present

## 2016-05-14 DIAGNOSIS — I10 Essential (primary) hypertension: Secondary | ICD-10-CM | POA: Diagnosis not present

## 2016-05-14 DIAGNOSIS — N281 Cyst of kidney, acquired: Secondary | ICD-10-CM | POA: Diagnosis present

## 2016-05-14 DIAGNOSIS — N179 Acute kidney failure, unspecified: Secondary | ICD-10-CM | POA: Diagnosis not present

## 2016-05-14 DIAGNOSIS — E876 Hypokalemia: Secondary | ICD-10-CM | POA: Diagnosis present

## 2016-05-14 DIAGNOSIS — K5900 Constipation, unspecified: Secondary | ICD-10-CM | POA: Diagnosis present

## 2016-05-14 LAB — CBC
HEMATOCRIT: 31 % — AB (ref 36.0–46.0)
Hemoglobin: 10.9 g/dL — ABNORMAL LOW (ref 12.0–15.0)
MCH: 27.8 pg (ref 26.0–34.0)
MCHC: 35.2 g/dL (ref 30.0–36.0)
MCV: 79.1 fL (ref 78.0–100.0)
Platelets: 149 10*3/uL — ABNORMAL LOW (ref 150–400)
RBC: 3.92 MIL/uL (ref 3.87–5.11)
RDW: 13.8 % (ref 11.5–15.5)
WBC: 26.9 10*3/uL — ABNORMAL HIGH (ref 4.0–10.5)

## 2016-05-14 LAB — URINE CULTURE: CULTURE: NO GROWTH

## 2016-05-14 LAB — BASIC METABOLIC PANEL
Anion gap: 9 (ref 5–15)
BUN: 8 mg/dL (ref 6–20)
CALCIUM: 8.4 mg/dL — AB (ref 8.9–10.3)
CO2: 21 mmol/L — ABNORMAL LOW (ref 22–32)
Chloride: 106 mmol/L (ref 101–111)
Creatinine, Ser: 0.88 mg/dL (ref 0.44–1.00)
GFR calc Af Amer: >60 mL/min (ref >60)
GLUCOSE: 131 mg/dL — AB (ref 65–99)
Potassium: 3.1 mmol/L — ABNORMAL LOW (ref 3.5–5.1)
Sodium: 136 mmol/L (ref 135–145)

## 2016-05-14 MED ORDER — POTASSIUM CHLORIDE CRYS ER 20 MEQ PO TBCR
20.0000 meq | EXTENDED_RELEASE_TABLET | Freq: Two times a day (BID) | ORAL | Status: DC
  Administered 2016-05-14 (×2): 20 meq via ORAL
  Filled 2016-05-14 (×2): qty 1

## 2016-05-14 MED ORDER — POLYETHYLENE GLYCOL 3350 17 G PO PACK
17.0000 g | PACK | Freq: Once | ORAL | Status: AC
  Administered 2016-05-14: 17 g via ORAL
  Filled 2016-05-14: qty 1

## 2016-05-14 MED ORDER — POLYETHYLENE GLYCOL 3350 17 G PO PACK: 17.0000 g | PACK | Freq: Every evening | ORAL | Status: DC | PRN

## 2016-05-14 MED ORDER — ACETAMINOPHEN 500 MG PO TABS
500.0000 mg | ORAL_TABLET | Freq: Once | ORAL | Status: AC
  Administered 2016-05-14: 500 mg via ORAL
  Filled 2016-05-14: qty 1

## 2016-05-14 NOTE — Progress Notes (Signed)
Family Medicine Teaching Service Daily Progress Note Intern Pager: [phone]  Patient name: Angel Morris Medical record number: [id number] Date of birth: 07-Mar-1976 Age: 40 y.o. Gender: female  Primary Care Provider: Crisoforo Oxford, PA-C Consultants: None Code Status: Full Code  Pt Overview and Major Events to Date:  9/16: Admitted for pyelonephritis  Assessment and Plan: Angel Morris is a 40 y.o. female presenting with pyelonephritis. PMH is significant for iron deficiency anemia, some fibroids and menorrhagia, and remote nephrolithiasis requiring surgical removal of a staghorn calculus in 2000 and recurrent UTIs for a few years but none she can recall since 2011.   Pyelonephritis: She has continued fevers to 101.52F overnight and blood pressure remains somewhat soft for her baseline. Urine culture growing E. Coli extensively at one day. We will continue cefepime for now anticipating narrowing antibiotics in the next 1-2 days. She had a single dose of ceftriaxone IM and cipro PO which probably did not fail due to resistance but we will keep on different treatment for now. She continues to have a leukocytosis to 26.9 and we will monitor to ensure there is no secondary bacteremia. Her imaging is unusual as the right sided collecting system is duplicated with hydronephrosis of a portion of the kidney. This structural abnormality might be a risk factor for her developing this complication infection as it correlates with her right sided symptoms. However she has not had recurrently problems for years. -F/u Urine Cx, Blood Cx -Cefepime 2g IV q12hrs (9/16>>>) -Reglan PO or IV PRN for nausea/vomiting (Patient with reported history of QTc prolongation) -Oxycodone 5-325mg  q4hrs PRN for pain  Acute kidney injury: Mild SCr elevation most likely pre-renal with her hypotension and volume depletion at presentation. This already improved back to baseline by day 1. -Hold further NSAIDs with  mild AKI -Supplement potassium PRN -Follow up repeat labs in AM  Hypertension: Aldactone and amlodipine held on admission for hypotension with severe systemic inflammation.  FEN/GI: Normal diet as tolerating Prophylaxis: [place] enoxaparin  Disposition: anticipate discharge to home in the next 1-2 days  Subjective:  She had a miserable night with fevers and frequent urination. She has been able to keep down both water and a few solid foods since yesterday evening.  Objective: Temp:  [97.5 F (36.4 C)-101.5 F (38.6 C)] 98.7 F (37.1 C) (09/17 0921) Pulse Rate:  [61-114] 89 (09/17 0921) Resp:  [18-22] 19 (09/17 0418) BP: (100-129)/(57-76) 118/73 (09/17 0921) SpO2:  [98 %-100 %] 100 % (09/17 0921) Weight:  [179 lb 3.7 oz (81.3 kg)] 179 lb 3.7 oz (81.3 kg) (09/16 1408) Physical Exam: General: Resting in bed in no acute distress Cardiovascular: Regular rate and rhythm, no murmurs Respiratory: CTAB Abdomen: Diffuse tenderness to palpation particularly near flanks. Extremities: No swelling, skin warm and dry  Laboratory:  Recent Labs Lab 05/12/16 1112 05/13/16 0947 05/14/16 0518  WBC 24.3* 26.9* 26.9*  HGB 12.8 13.0 10.9*  HCT 36.1 36.7 31.0*  PLT 207 167 149*    Recent Labs Lab 05/12/16 1112 05/13/16 0947 05/14/16 0518  NA 134* 135 136  K 4.1 3.3* 3.1*  CL 101 102 106  CO2 25 22 21*  BUN 9 17 8   CREATININE 0.95 1.32* 0.88  CALCIUM 9.0 9.0 8.4*  PROT  --  7.2  --   BILITOT  --  0.4  --   ALKPHOS  --  89  --   ALT  --  16  --   AST  --  22  --  GLUCOSE 130* 156* 131*    Urine Culture 9/16: E. 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 No definite obstructing cause identified. Mild enhancement of the right ureteral wall may represent inflammation or infection.  Multiple  nonobstructing bilateral renal calculi.  Cholelithiasis without CT evidence of acute cholecystitis.   Angel Salina, MD PGY-II Internal Medicine Resident Pager# [phone] 05/14/2016, 1:33 PM

## 2016-05-14 NOTE — Progress Notes (Signed)
MD on call made aware of fever of 101.5 post 500 tylenol. New order received for another 500 of tylenol. Cold compress given to patient.

## 2016-05-15 DIAGNOSIS — D509 Iron deficiency anemia, unspecified: Secondary | ICD-10-CM

## 2016-05-15 DIAGNOSIS — I10 Essential (primary) hypertension: Secondary | ICD-10-CM

## 2016-05-15 DIAGNOSIS — N179 Acute kidney failure, unspecified: Secondary | ICD-10-CM

## 2016-05-15 LAB — BASIC METABOLIC PANEL
Anion gap: 7 (ref 5–15)
BUN: 6 mg/dL (ref 6–20)
CHLORIDE: 103 mmol/L (ref 101–111)
CO2: 24 mmol/L (ref 22–32)
CREATININE: 0.81 mg/dL (ref 0.44–1.00)
Calcium: 8.6 mg/dL — ABNORMAL LOW (ref 8.9–10.3)
GFR calc Af Amer: >60 mL/min (ref >60)
GFR calc non Af Amer: >60 mL/min (ref >60)
GLUCOSE: 123 mg/dL — AB (ref 65–99)
Potassium: 3.2 mmol/L — ABNORMAL LOW (ref 3.5–5.1)
Sodium: 134 mmol/L — ABNORMAL LOW (ref 135–145)

## 2016-05-15 LAB — CBC
HCT: 33 % — ABNORMAL LOW (ref 36.0–46.0)
Hemoglobin: 11.3 g/dL — ABNORMAL LOW (ref 12.0–15.0)
MCH: 27.1 pg (ref 26.0–34.0)
MCHC: 34.2 g/dL (ref 30.0–36.0)
MCV: 79.1 fL (ref 78.0–100.0)
PLATELETS: 179 10*3/uL (ref 150–400)
RBC: 4.17 MIL/uL (ref 3.87–5.11)
RDW: 13.7 % (ref 11.5–15.5)
WBC: 21.3 10*3/uL — ABNORMAL HIGH (ref 4.0–10.5)

## 2016-05-15 LAB — URINE CULTURE

## 2016-05-15 MED ORDER — DEXTROSE 5 % IV SOLN
1.0000 g | INTRAVENOUS | Status: DC
  Administered 2016-05-15 – 2016-05-17 (×3): 1 g via INTRAVENOUS
  Filled 2016-05-15 (×4): qty 10

## 2016-05-15 MED ORDER — POTASSIUM CHLORIDE CRYS ER 20 MEQ PO TBCR
40.0000 meq | EXTENDED_RELEASE_TABLET | Freq: Two times a day (BID) | ORAL | Status: AC
  Administered 2016-05-15 (×2): 40 meq via ORAL
  Filled 2016-05-15 (×2): qty 2

## 2016-05-15 NOTE — Progress Notes (Signed)
Family Medicine Teaching Service Daily Progress Note Intern Pager: [phone]  Patient name: Angel Morris Medical record number: [id number] Date of birth: 08-31-1975 Age: 40 y.o. Gender: female  Primary Care Provider: Crisoforo Oxford, PA-C Consultants: None Code Status: Full Code  Pt Overview and Major Events to Date:  9/16: Admitted for pyelonephritis started on cefepime  9/17: fever up to 101.2 at 4pm  Assessment and Plan: Angel Morris is a 40 y.o. female presenting with pyelonephritis. PMH is significant for iron deficiency anemia, fibroids and menorrhagia, and remote nephrolithiasis requiring surgical removal of a staghorn calculus in 2000 and recurrent UTIs for a few years but none she can recall since 2011.   Complicated Pyelonephritis: Last documented fever 101.5 at 16:00 yesterday with blood pressure stable. Urine culture growing E. Coli extensively at one day, pan-sensitive She had a single dose of ceftriaxone IM and cipro PO outpatient. Received 2 days IV cefepime. Leukocytosis improving to 21.3. Will continue to monitor to ensure there is no secondary bacteremia. On CT, the right sided collecting system is duplicated with hydronephrosis of a portion of the kidney. This structural abnormality might be a risk factor for her developing this complication infection as it correlates with her right sided symptoms. However she has not had recurrently problems for years. -Urine Cx outpatient grow pan-sensitive E. Coli. ED Urine Cx and Blood Cx neg after 1 day -d/c IV cefepime and switch to IV ceftriaxone 1g q24h for more narrow coverage since sensitive. -Reglan PO or IV PRN for nausea/vomiting (Patient with reported history of QTc prolongation) -Oxycodone 5-325mg  q4hrs PRN for pain  Acute kidney injury, resolved: Mild SCr elevation on admission most likely pre-renal with her hypotension and volume depletion at presentation.  -Hold further NSAIDs with mild AKI -continue to  monitor   Hypokalemia: possibly due to holding spironolactone and vomiting.  - continue to monitor and replete while holding spironolactone.   Hypertension: Aldactone and amlodipine held on admission for hypotension with severe systemic inflammation -BP stable now but cont to hold today.  FEN/GI: Regular diet Prophylaxis: [place] enoxaparin  Disposition: anticipate discharge to home in the next 1-2 days  Subjective:  [name] rested well last night but did have some chills, no fever. Tolerating PO intake well. Denies dysuria, hematuria. Reports constipation.  Objective: Temp:  [98.1 F (36.7 C)-101.5 F (38.6 C)] 98.1 F (36.7 C) (09/18 0451) Pulse Rate:  [78-104] 83 (09/18 0451) Resp:  [16] 16 (09/17 2216) BP: (115-141)/(73-89) 115/77 (09/18 0451) SpO2:  [97 %-100 %] 98 % (09/18 0451) Physical Exam: General: Resting in bed in no acute distress.  Cardiovascular: Regular rate and rhythm, no murmurs Respiratory: CTAB, no increased work of breathing Abdomen: Soft, not distended, bowel sounds present. No suprapubic tenderness. Flanks tender to palpation R>L. Epigastric tenderness - no rebound or guarding. Extremities: No swelling, skin warm and dry  Laboratory:  Recent Labs Lab 05/13/16 0947 05/14/16 0518 05/15/16 0440  WBC 26.9* 26.9* 21.3*  HGB 13.0 10.9* 11.3*  HCT 36.7 31.0* 33.0*  PLT 167 149* 179    Recent Labs Lab 05/13/16 0947 05/14/16 0518 05/15/16 0440  NA 135 136 134*  K 3.3* 3.1* 3.2*  CL 102 106 103  CO2 22 21* 24  BUN 17 8 6   CREATININE 1.32* 0.88 0.81  CALCIUM 9.0 8.4* 8.6*  PROT 7.2  --   --   BILITOT 0.4  --   --   ALKPHOS 89  --   --   ALT  16  --   --   AST 22  --   --   GLUCOSE 156* 131* 123*    Urine Culture 9/15: E. Coli, pan-sensitive Urine culture 9/16: negative Blood Culture 9/16: NGTD  Imaging/Diagnostic Tests: EXAM 9/16: CT ABDOMEN AND PELVIS WITHOUT CONTRAST  Duplicated right renal collecting system with hydronephrosis of  the upper moiety, slightly increased since 2007. No definite obstructing cause identified. Mild enhancement of the right ureteral wall may represent inflammation or infection.  Multiple nonobstructing bilateral renal calculi.  Cholelithiasis without CT evidence of acute cholecystitis.   RESIDENT ADDENDUM  I have separately seen and examined the patient. I have discussed the findings and exam with the medical student and agree with the above note, which I have edited appropriately. I helped develop the management plan that is described in the student's note, and I agree with the content.  Additionally I have outlined my exam and assessment/plan below:   Doing ok. Hungry. Some abdominal pain and back pain. Had fever yesterday. Feels she may be getting fever now.   PE:  Blood pressure 115/77, pulse 83, temperature 98.1 F (36.7 C), temperature source Oral, resp. rate 16, height 5\' 4"  (1.626 m), weight 179 lb 3.7 oz (81.3 kg), SpO2 98 %.  General: Lying in bed in NAD. Non-toxic Eyes: Conjunctivae non-injected.  ENTM: Moist mucous membranes. Oropharynx clear. No nasal discharge.  Cardiovascular: RRR. No murmurs, rubs, or gallops noted. No pitting edema noted. Respiratory: No increased WOB. CTAB without wheezing, rhonchi, or crackles noted. Abdomen: +BS, soft, non-distended, mild epigastric tenderness without rebound or guarding. Tenderness in the flank bilaterally.   A/P:  40 y/o presenting with pyleonephritis with last fever yesterday at 4pm. She has h/o of HTN however BPs at goal here.  - will narrow from cefepime to CTX -given she's had abx x 48hrs, if she continues to have fevers will get renal U/S to assess for abscess if she continues to have fevers. - if doing well today, consider transition to orals. - holding amlodipine and spirolactone until BPs higher.      Archie Patten, MD PGY-3,  [facility] 05/15/2016  9:40 AM

## 2016-05-16 ENCOUNTER — Encounter (HOSPITAL_COMMUNITY): Payer: Self-pay | Admitting: Radiology

## 2016-05-16 ENCOUNTER — Inpatient Hospital Stay (HOSPITAL_COMMUNITY): Payer: BLUE CROSS/BLUE SHIELD

## 2016-05-16 ENCOUNTER — Encounter: Payer: BLUE CROSS/BLUE SHIELD | Admitting: Medical

## 2016-05-16 DIAGNOSIS — N179 Acute kidney failure, unspecified: Secondary | ICD-10-CM

## 2016-05-16 LAB — BASIC METABOLIC PANEL
ANION GAP: 9 (ref 5–15)
BUN: 10 mg/dL (ref 6–20)
CALCIUM: 8.6 mg/dL — AB (ref 8.9–10.3)
CO2: 23 mmol/L (ref 22–32)
CREATININE: 0.81 mg/dL (ref 0.44–1.00)
Chloride: 103 mmol/L (ref 101–111)
GLUCOSE: 106 mg/dL — AB (ref 65–99)
Potassium: 4.1 mmol/L (ref 3.5–5.1)
Sodium: 135 mmol/L (ref 135–145)

## 2016-05-16 LAB — CBC
HCT: 33.6 % — ABNORMAL LOW (ref 36.0–46.0)
HEMOGLOBIN: 12 g/dL (ref 12.0–15.0)
MCH: 28.3 pg (ref 26.0–34.0)
MCHC: 35.7 g/dL (ref 30.0–36.0)
MCV: 79.2 fL (ref 78.0–100.0)
Platelets: 181 10*3/uL (ref 150–400)
RBC: 4.24 MIL/uL (ref 3.87–5.11)
RDW: 13.7 % (ref 11.5–15.5)
WBC: 17.3 10*3/uL — ABNORMAL HIGH (ref 4.0–10.5)

## 2016-05-16 MED ORDER — IOPAMIDOL (ISOVUE-300) INJECTION 61%
INTRAVENOUS | Status: AC
  Administered 2016-05-16: 100 mL
  Filled 2016-05-16: qty 100

## 2016-05-16 MED ORDER — BISACODYL 5 MG PO TBEC
5.0000 mg | DELAYED_RELEASE_TABLET | Freq: Once | ORAL | Status: AC
  Administered 2016-05-16: 5 mg via ORAL
  Filled 2016-05-16: qty 1

## 2016-05-16 MED ORDER — BISACODYL 5 MG PO TBEC: 5.0000 mg | DELAYED_RELEASE_TABLET | Freq: Every day | ORAL | Status: DC | PRN

## 2016-05-16 NOTE — Progress Notes (Signed)
Family Medicine Teaching Service Daily Progress Note Intern Pager: [phone]  Patient name: Angel Morris Medical record number: [id number] Date of birth: August 27, 1976 Age: 40 y.o. Gender: female  Primary Care Provider: Crisoforo Oxford, PA-C Consultants: None Code Status: Full Code  Pt Overview and Major Events to Date:  9/16: Admitted for pyelonephritis started on cefepime  9/17: fever up to 101.2 at 4pm 9/18: switched to IV ceftriaxone, fever 101.8 at 6pm  Assessment and Plan: Angel Morris is a 40 y.o. female presenting with pyelonephritis. PMH is significant for iron deficiency anemia, fibroids and menorrhagia, and remote nephrolithiasis requiring surgical removal of a staghorn calculus in 2000 and recurrent UTIs for a few years but none she can recall since 2011.   Complicated Pyelonephritis: Last documented fever 101.8 at 18:00 7/18 with blood pressure stable. Urine culture growing E. Coli, pan-sensitive She had a single dose of ceftriaxone IM and cipro PO outpatient. Received 2 days IV cefepime. Leukocytosis improving to 17.3. Will continue to monitor to ensure there is no secondary bacteremia. On CT, the right sided collecting system is duplicated with hydronephrosis of a portion of the kidney. This structural abnormality might be a risk factor for her developing this complicated infection as it correlates with her right sided symptoms. However she has not had recurrent problems for years. -Urine Cx outpatient grow pan-sensitive E. Coli. ED Urine Cx and Blood Cx neg -IV ceftriaxone 1g q24h (today is day 5 of antibiotic therapy) - will consider renal ultrasound, considering continued sporadic fever after 4 days abx -Reglan PO or IV PRN for nausea/vomiting (Patient with reported history of QTc prolongation) -Oxycodone 5-325mg  q4hrs PRN for pain; patient has received 3 doses in 24 hour period  -tylenol 500 mg q6h prn for pain or fever   Acute kidney injury, resolved: Mild SCr  elevation on admission most likely pre-renal with her hypotension and volume depletion at presentation.  -Hold further NSAIDs with mild AKI -continue to monitor   Hypokalemia, Resolved: possibly due to holding spironolactone and vomiting, resolved. - continue to monitor   Hypertension: Aldactone and amlodipine held on admission for hypotension with severe systemic inflammation -BP stable now but cont to hold today.  FEN/GI: Regular diet Prophylaxis: [place] enoxaparin  Disposition: anticipate discharge to home in the next 1-2 days  Subjective:  [name] is feeling better and has a good appetite. Tolerating PO intake well. Continued, but improving back pain. Reports some fever/chills.  Objective: Temp:  [98.2 F (36.8 C)-101.8 F (38.8 C)] 99.8 F (37.7 C) (09/19 0538) Pulse Rate:  [85-95] 85 (09/19 0538) Resp:  [16-17] 16 (09/19 0538) BP: (122-130)/(80-88) 129/88 (09/19 0538) SpO2:  [98 %-100 %] 98 % (09/19 0538) Physical Exam: General: Resting in bed in no acute distress.  Cardiovascular: Regular rate and rhythm, no murmurs Respiratory: CTAB, no increased work of breathing Abdomen: Soft, not distended, bowel sounds present, no rebound or guarding. No suprapubic tenderness. CVA tenderness R>L. Extremities: No swelling, skin warm and dry  Laboratory:  Recent Labs Lab 05/14/16 0518 05/15/16 0440 05/16/16 0335  WBC 26.9* 21.3* 17.3*  HGB 10.9* 11.3* 12.0  HCT 31.0* 33.0* 33.6*  PLT 149* 179 181    Recent Labs Lab 05/13/16 0947 05/14/16 0518 05/15/16 0440 05/16/16 0335  NA 135 136 134* 135  K 3.3* 3.1* 3.2* 4.1  CL 102 106 103 103  CO2 22 21* 24 23  BUN 17 8 6 10   CREATININE 1.32* 0.88 0.81 0.81  CALCIUM 9.0 8.4* 8.6*  8.6*  PROT 7.2  --   --   --   BILITOT 0.4  --   --   --   ALKPHOS 89  --   --   --   ALT 16  --   --   --   AST 22  --   --   --   GLUCOSE 156* 131* 123* 106*    Urine Culture 9/15: E. 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 No definite obstructing cause identified. Mild enhancement of the right ureteral wall may represent inflammation or infection.  Multiple nonobstructing bilateral renal calculi.  Cholelithiasis without CT evidence of acute cholecystitis.   RESIDENT ADDENDUM  I have separately seen and examined the patient. I have discussed the findings and exam with the medical student and agree with the above note, which I have edited appropriately. I helped develop the management plan that is described in the student's note, and I agree with the content.  Additionally I have outlined my exam and assessment/plan below:   States that she feels much better than when she came in to hospital. Has been trying to drink a lot of water. Good PO intake without nausea/vomiting. Still with right flank pain.   PE:  Blood pressure 115/77, pulse 83, temperature 98.1 F (36.7 C), temperature source Oral, resp. rate 16, height 5\' 4"  (1.626 m), weight 179 lb 3.7 oz (81.3 kg), SpO2 98 %.  General: Lying in bed in NAD. Non-toxic. Conversant and pleasant.  Eyes: Conjunctivae non-injected.  ENTM: Moist mucous membranes. Oropharynx clear. No nasal discharge.  Cardiovascular: RRR. No murmurs, rubs, or gallops noted. No LE edema.  Respiratory: No increased WOB. CTAB without wheezing, rhonchi, or crackles noted. Abdomen: +BS, soft, non-distended, not TTP. Tenderness in the right flank.   A/P:  40 y/o presenting with pyleonephritis with last fever yesterday at 18:45. Leukocytosis continues to improve and other vital signs stable. She has h/o of HTN however BPs at goal here. - continue CTX -given she's had abx >72 hours and had another isolated fever, would consider renal U/S to assess for abscess -  plan to transition to PO antibiotics prior to discharge  - holding amlodipine and spirolactone until BPs higher.    Phill Myron, D.O. 05/16/2016, 9:57 AM PGY-2, [place]

## 2016-05-16 NOTE — Consult Note (Signed)
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   Referring Physician: Dorothea Ogle MD  Angel Morris is an 40 y.o. female.   HPI:   1 - Pyelonephritis - pt admitted with fevers, maliase, e.coli bacteruria c/w pyelo. UCX 9/15 E. Coli pan-sensitive, f/u UCX negative and BCX negative. Now on ceftriaxone with slowly resolving occasional fever spikes.   2 - Nephrolithiasis - s/p right percutaneous stone surgery for partial staghorn 2001. CT 04/2016 with numerous <49m bilateral renal stones, no ureteral stones.  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 periplevic cyst arrpox stable since 2007. Dedicated urogram 9/19 withtou delayed nephrogram and passage of contrast to distal ureter. H/o staghorn in stone in similar locations years ago.   4 - Left Peripelvic Renal Cyst - approx 3cm Rt mid likelky peripelvic cyst. Non-complex by contrast imaging this admission.   Today "Angel Morris is seen in consultation for above.   Past Medical History:  Diagnosis Date  . Abnormal EKG    long QT  . Abnormal pap    lSIL   . Abnormal Pap smear   . Anemia, iron deficiency   . Anxiety   . Asthma    childhood  . BV (bacterial vaginosis) 03/06/2016  . Complication of anesthesia    States "when I had kidney stones removed(Open); I had a build up of fluid in my lungs. But I dont really remember much about it otherwise".  . Contraceptive management 07/28/2013  . Headache(784.0)   . Herpes 04/05/2016  . History of recurrent UTIs   . Human papilloma virus   . Hypertension   . Hypokalemia    being addressed with her PMD  . Kidney stones   . Menorrhagia   . Nephrolithiasis   . Onychomycosis   . Overweight(278.02)   . Proteinuria   . Urinary urgency 03/06/2016  . Uterine fibroid   . Vaginal discharge 03/06/2016  . Vaginal irritation 03/06/2016  . Wears contact  lenses     Past Surgical History:  Procedure Laterality Date  . CERVICAL CONIZATION W/BX N/A 10/08/2013   Procedure: CONIZATION CERVIX WITH BIOPSY;  Surgeon: LFlorian Buff MD;  Location: [facility];  Service: Gynecology;  Laterality: N/A;  . DILATION AND CURETTAGE OF UTERUS    . DILITATION & CURRETTAGE/HYSTROSCOPY WITH NOVASURE ABLATION N/A 07/02/2015   Procedure: DILATATION & CURETTAGE/HYSTEROSCOPY WITH NOVASURE ABLATION;  Surgeon: LFlorian Buff MD;  Location: [facility];  Service: Gynecology;  Laterality: N/A;  Uterine Cavity Length 5.5 Uterine Cavity Width 3.5 Power 106 Time 1 minute 4 seconds  . HOLMIUM LASER APPLICATION N/A 22/24/4975  Procedure: HOLMIUM LASER APPLICATION;  Surgeon: LFlorian Buff MD;  Location: [facility];  Service: Gynecology;  Laterality: N/A;  . kidney stones  june 2000   open removal  . rotary cuff - left shoulder  2005  . ROTATOR CUFF REPAIR Left     Family History  Problem Relation Age of Onset  . Fibromyalgia Mother   . Arthritis Mother   . Diabetes Mother   . Hypertension Mother   . Diabetes Brother   . Diabetes Father   . Stroke Maternal Grandmother   . Aneurysm Maternal Grandfather   . Stroke Other     Social History:  reports that she has never smoked. She has never used smokeless tobacco. She reports that she drinks alcohol. She reports that  she does not use drugs.  Allergies: No Known Allergies  Medications: I have reviewed the patient's current medications.  Results for orders placed or performed during the hospital encounter of 05/13/16 (from the past 48 hour(s))  Basic metabolic panel     Status: Abnormal   Collection Time: 05/15/16  4:40 AM  Result Value Ref Range   Sodium 134 (L) 135 - 145 mmol/L   Potassium 3.2 (L) 3.5 - 5.1 mmol/L   Chloride 103 101 - 111 mmol/L   CO2 24 22 - 32 mmol/L   Glucose, Bld 123 (H) 65 - 99 mg/dL   BUN 6 6 - 20 mg/dL   Creatinine, Ser 0.81 0.44 - 1.00 mg/dL   Calcium 8.6 (L) 8.9 - 10.3 mg/dL   GFR calc non Af  Amer >60 >60 mL/min   GFR calc Af Amer >60 >60 mL/min    Comment: (NOTE) The eGFR has been calculated using the CKD EPI equation. This calculation has not been validated in all clinical situations. eGFR's persistently <60 mL/min signify possible Chronic Kidney Disease.    Anion gap 7 5 - 15  CBC     Status: Abnormal   Collection Time: 05/15/16  4:40 AM  Result Value Ref Range   WBC 21.3 (H) 4.0 - 10.5 K/uL   RBC 4.17 3.87 - 5.11 MIL/uL   Hemoglobin 11.3 (L) 12.0 - 15.0 g/dL   HCT 33.0 (L) 36.0 - 46.0 %   MCV 79.1 78.0 - 100.0 fL   MCH 27.1 26.0 - 34.0 pg   MCHC 34.2 30.0 - 36.0 g/dL   RDW 13.7 11.5 - 15.5 %   Platelets 179 150 - 400 K/uL  Basic metabolic panel Once     Status: Abnormal   Collection Time: 05/16/16  3:35 AM  Result Value Ref Range   Sodium 135 135 - 145 mmol/L   Potassium 4.1 3.5 - 5.1 mmol/L    Comment: DELTA CHECK NOTED   Chloride 103 101 - 111 mmol/L   CO2 23 22 - 32 mmol/L   Glucose, Bld 106 (H) 65 - 99 mg/dL   BUN 10 6 - 20 mg/dL   Creatinine, Ser 0.81 0.44 - 1.00 mg/dL   Calcium 8.6 (L) 8.9 - 10.3 mg/dL   GFR calc non Af Amer >60 >60 mL/min   GFR calc Af Amer >60 >60 mL/min    Comment: (NOTE) The eGFR has been calculated using the CKD EPI equation. This calculation has not been validated in all clinical situations. eGFR's persistently <60 mL/min signify possible Chronic Kidney Disease.    Anion gap 9 5 - 15  CBC Once     Status: Abnormal   Collection Time: 05/16/16  3:35 AM  Result Value Ref Range   WBC 17.3 (H) 4.0 - 10.5 K/uL   RBC 4.24 3.87 - 5.11 MIL/uL   Hemoglobin 12.0 12.0 - 15.0 g/dL   HCT 33.6 (L) 36.0 - 46.0 %   MCV 79.2 78.0 - 100.0 fL   MCH 28.3 26.0 - 34.0 pg   MCHC 35.7 30.0 - 36.0 g/dL   RDW 13.7 11.5 - 15.5 %   Platelets 181 150 - 400 K/uL    No results found.  Review of Systems  Constitutional: Positive for fever. Negative for malaise/fatigue.  HENT: Negative.   Eyes: Negative.   Respiratory: Negative.    Cardiovascular: Negative.   Gastrointestinal: Negative.   Genitourinary: Positive for flank pain.  Skin: Negative.   Neurological: Negative.  Endo/Heme/Allergies: Negative.   Psychiatric/Behavioral: Negative.    Blood pressure 130/84, pulse 74, temperature 97.5 F (36.4 C), temperature source Oral, resp. rate 17, height '5\' 4"'  (1.626 m), weight 81.3 kg (179 lb 3.7 oz), SpO2 97 %. Physical Exam  Constitutional: She is oriented to person, place, and time. She appears well-developed.  Very pleasant  HENT:  Head: Normocephalic.  Eyes: Pupils are equal, round, and reactive to light.  Neck: Normal range of motion.  Cardiovascular: Normal rate.   Respiratory: Effort normal.  GI: Soft.  Genitourinary:  Genitourinary Comments: Old Rt PCNL site noted above Rt 12th rib.   Musculoskeletal: Normal range of motion.  Neurological: She is alert and oriented to person, place, and time.  Skin: Skin is warm.  Psychiatric: She has a normal mood and affect. Her behavior is normal. Judgment and thought content normal.    Assessment/Plan:  1 - Pyelonephritis - agree with current ABX, improving by leukocytisis and fever spikes appear to be furhter apart c/w resolvign pyelo. Agree with continued IV ABX until afebrile x 24 hrs, then PO transition for 2 weeks total course. No surgical indications.   2 - Nephrolithiasis - no obstructing stones at present. She would likely benefit from dedicated metabolic eval as outpatient. Her low-normal Ca in serum does raise some suspicion of "renal leak hypercalciuria". Discussed this with patient today.   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 No high grade obstruction on dedicated urogram. Do not favor stents / perc drains given this favorable finding.   4 - Left Peripelvic Renal Cyst - non-complex and no mass effect by imaging. Observe.   Please call me directly with  questions.   Hazell Siwik 05/16/2016, 1:15 PM

## 2016-05-17 DIAGNOSIS — A4151 Sepsis due to Escherichia coli [E. coli]: Secondary | ICD-10-CM

## 2016-05-17 LAB — CBC
HEMATOCRIT: 33.7 % — AB (ref 36.0–46.0)
Hemoglobin: 12 g/dL (ref 12.0–15.0)
MCH: 27.9 pg (ref 26.0–34.0)
MCHC: 35.6 g/dL (ref 30.0–36.0)
MCV: 78.4 fL (ref 78.0–100.0)
PLATELETS: 224 10*3/uL (ref 150–400)
RBC: 4.3 MIL/uL (ref 3.87–5.11)
RDW: 13.7 % (ref 11.5–15.5)
WBC: 13.5 10*3/uL — AB (ref 4.0–10.5)

## 2016-05-17 LAB — BASIC METABOLIC PANEL
ANION GAP: 9 (ref 5–15)
BUN: 10 mg/dL (ref 6–20)
CALCIUM: 8.8 mg/dL — AB (ref 8.9–10.3)
CO2: 22 mmol/L (ref 22–32)
Chloride: 103 mmol/L (ref 101–111)
Creatinine, Ser: 0.67 mg/dL (ref 0.44–1.00)
Glucose, Bld: 124 mg/dL — ABNORMAL HIGH (ref 65–99)
Potassium: 3.8 mmol/L (ref 3.5–5.1)
SODIUM: 134 mmol/L — AB (ref 135–145)

## 2016-05-17 MED ORDER — BISACODYL 5 MG PO TBEC: 5.0000 mg | DELAYED_RELEASE_TABLET | Freq: Every day | ORAL | 0 refills | Status: DC | PRN

## 2016-05-17 NOTE — Discharge Summary (Signed)
[facility] Discharge Summary  Patient name: Angel Morris Medical record number: [id number] Date of birth: 23-Jan-1976 Age: 40 y.o. Gender: female Date of Admission: 05/13/2016  Date of Discharge: 05/17/2016 Admitting Physician: Blane Ohara McDiarmid, MD  Primary Care Provider: Crisoforo Oxford, PA-C Consultants: Urology  Indication for Hospitalization: Pyelonephritis  Discharge Diagnoses/Problem List:  1. Complicated pyelonephritis 2. 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 Left peripelvic renal cyst, non-complex 4. Nephrolithiasis, no obstructing stones at present 4. HTN   Disposition: Home  Discharge Condition: Stable  Discharge Exam:  General: Resting in bed in no acute distress.  Cardiovascular: Regular rate and rhythm, no murmurs Respiratory: CTAB, no increased work of breathing Abdomen: Soft, not distended, bowel sounds present, no rebound or guarding. No suprapubic tenderness. CVA tenderness R>L, improving. Extremities: No swelling, skin warm and dry  Brief Hospital Course:  Ms. Tegethoff was admitted 05/13/16 for pyelonephritis. One week history of increasing symptoms, received one dose IM ceftriaxone and PO cipro outpatient on 05/12/16. On admission, patient met SIRS criteria and fluid resuscitation was initiated, IV cefepime started. Patient responded well and vital signs became stable. . Complicated pyelo due to anatomic abnormality. Outpatient urine culture grew pan-sensitive E.coli so antibiotic coverage was narrowed from Cefepime to Ceftriaxone. Urine and blood cultures from ED no growth. Continued to spike intermittent fevers, urology consulted, CT abd/pelvis ruled out abscess. Leukocytosis continued to downtrend  Patient is to take PO ciprofloxacin at discharge to complete 14 day course on 05/25/16.  Issues for Follow Up:  1. Restarted home amlodipine 10 mg daily. Held home aldactone 25 mg daily. Restart this as BP  tolerates. 2. Nephrolithiasis: per urology, "no obstructing stones at present. She would likely benefit from dedicated metabolic eval as outpatient. Her low-normal Ca in serum does raise some suspicion of "renal leak hypercalciuria". " 3. Ensure that patient completed full course of antibiotics and is now asymptomatic.  4. Consider checking BMET as patient's potassium was low for the first several days of hospitalization. Potassium levels were normal for two days prior to discharge.  5. Patient instructed to hold Fe supplement while taking Cipro. She can resume once antibiotic course completed.   Significant Procedures: None  Significant Labs and Imaging:   Recent Labs Lab 05/15/16 0440 05/16/16 0335 05/17/16 0732  WBC 21.3* 17.3* 13.5*  HGB 11.3* 12.0 12.0  HCT 33.0* 33.6* 33.7*  PLT 179 181 224    Recent Labs Lab 05/13/16 0947 05/14/16 0518 05/15/16 0440 05/16/16 0335 05/17/16 0732  NA 135 136 134* 135 134*  K 3.3* 3.1* 3.2* 4.1 3.8  CL 102 106 103 103 103  CO2 22 21* '24 23 22  ' GLUCOSE 156* 131* 123* 106* 124*  BUN '17 8 6 10 10  ' CREATININE 1.32* 0.88 0.81 0.81 0.67  CALCIUM 9.0 8.4* 8.6* 8.6* 8.8*  ALKPHOS 89  --   --   --   --   AST 22  --   --   --   --   ALT 16  --   --   --   --   ALBUMIN 3.1*  --   --   --   --     Ct Hematuria Workup  Result Date: 05/16/2016 CLINICAL DATA:  Pyelonephritis. Renal infections since Wednesday. History kidney stones with lithotripsy 17 years ago. EXAM: CT ABDOMEN AND PELVIS WITHOUT AND WITH CONTRAST TECHNIQUE: Multidetector CT imaging of the abdomen and pelvis was performed following the standard  protocol before and following the bolus administration of intravenous contrast. CONTRAST:  123m ISOVUE-300 IOPAMIDOL (ISOVUE-300) INJECTION 61% COMPARISON:  Stone study of 05/13/2016 FINDINGS: Lower chest: Bibasilar atelectasis. Mild cardiomegaly. Trace bilateral pleural effusions are slightly increased. Hepatobiliary: The may be a too small  to characterize hepatic dome lesion image 15/ series 301. Hepatomegaly at 20.7 cm. Multiple gallstones without evidence of acute cholecystitis or biliary duct dilatation. Pancreas: Normal, without mass or ductal dilatation. Spleen: Normal in size, without focal abnormality. Adrenals/Urinary Tract: Normal adrenal glands. Bilateral renal collecting system calculi. Greater in number on the right. The largest right-sided stone measures 5 mm in the upper pole. The largest left-sided stone measures 7 mm in the lower pole. Pelvic phleboliths, without hydroureter or ureteric stone. No bladder calculi. 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 There is minimal interpolar right-sided caliectasis as well. An interpolar left renal cyst measures 3.6 cm. Heterogeneous enhancement involves the upper pole right kidney, including on image 34/ series 301. No perinephric fluid collection or abscess. There may be mild upper pole left-sided heterogeneous enhancement as well. Normal appearance of the bladder. Left ureter and bladder well opacified on delayed images, without filling defect. The upper pole right-sided ureter is incompletely opacified. Stomach/Bowel: Normal stomach, without wall thickening. Colonic stool burden suggests constipation. Normal terminal ileum and appendix. Normal small bowel. Vascular/Lymphatic: Normal caliber of the aorta and branch vessels. No abdominopelvic adenopathy. Reproductive: Normal uterus and adnexa. Other: No significant free fluid. Musculoskeletal: No acute osseous abnormality. Lumbosacral disc bulge. IMPRESSION: 1. Right and possible left-sided heterogeneous renal enhancement, consistent with the clinical history pyelonephritis. No abscess or other acute complication. 2. 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 3. Bilateral nephrolithiasis. 4.  Cholelithiasis. 5. Hepatomegaly. 6. Increase in trace bilateral pleural effusions. 7.  Possible constipation. Electronically Signed   By: KAbigail MiyamotoM.D.   On: 05/16/2016 17:03    Results/Tests Pending at Time of Discharge: None  Discharge Medications:    Medication List    STOP taking these medications   ferrous sulfate 325 (65 FE) MG tablet   spironolactone 25 MG tablet Commonly known as:  ALDACTONE     TAKE these medications   acetaminophen 325 MG tablet Commonly known as:  TYLENOL Take 650 mg by mouth every 6 (six) hours as needed for fever (pain).   amLODipine 10 MG tablet Commonly known as:  NORVASC TAKE 1 TABLET BY MOUTH DAILY   bisacodyl 5 MG EC tablet Commonly known as:  DULCOLAX Take 1 tablet (5 mg total) by mouth daily as needed for moderate constipation.   diclofenac 75 MG EC tablet Commonly known as:  VOLTAREN Take 75 mg by mouth 2 (two) times daily.   ibuprofen 200 MG tablet Commonly known as:  ADVIL,MOTRIN Take 600 mg by mouth every 6 (six) hours as needed for fever (pain).   MAGNESIUM PO Take 1 tablet by mouth daily.   TRI-PREVIFEM 0.18/0.215/0.25 MG-35 MCG tablet Generic drug:  Norgestimate-Ethinyl Estradiol Triphasic Take 1 tablet by mouth daily.       Discharge Instructions: Please refer to Patient Instructions section of EMR for full details.  Patient was counseled important signs and symptoms that should prompt return to medical care, changes in medications, dietary instructions, activity restrictions, and follow up appointments.   Follow-Up Appointments: Follow-up Information    TYSINGER, DAVID SMont Ida PA-C. Schedule an appointment as soon as possible for  a visit in 1 week(s).   Specialty:  Family Medicine Why:  Please make an appointment with your primary care doctor within a week for hospital follow up. Contact information: [address] [place] [address] [phone]          The above note was created with the  assistance of Georgina Pillion, MS-4 and edited as appropriate.   Nicolette Bang, DO 05/17/2016, 12:33 PM PGY-2, [place]

## 2016-05-17 NOTE — Discharge Instructions (Signed)
Continue your Ciprofloxacin that you were given by your PCP prior to hospitalization. Your last day of antibiotics should be 9/28 to complete 14 days of treatment. Either stop taking your iron during the antibiotic course, because it can lower the antibiotic's effectiveness, or make sure you don't take the iron and Ciprofloxacin within two hours of each other.   Your blood pressures were well controlled without your Spironolactone while in the hospital. Please follow up with your PCP about whether or not you need this medication.

## 2016-05-18 ENCOUNTER — Telehealth: Payer: Self-pay | Admitting: Medical

## 2016-05-18 LAB — CULTURE, BLOOD (ROUTINE X 2)
CULTURE: NO GROWTH
CULTURE: NO GROWTH

## 2016-05-18 NOTE — Telephone Encounter (Signed)
Call and left a message for pt to call. Needs a hospital follow up appt.

## 2016-05-20 ENCOUNTER — Other Ambulatory Visit: Payer: Self-pay | Admitting: Internal Medicine

## 2016-05-24 ENCOUNTER — Ambulatory Visit (INDEPENDENT_AMBULATORY_CARE_PROVIDER_SITE_OTHER): Payer: BLUE CROSS/BLUE SHIELD | Admitting: Medical

## 2016-05-24 VITALS — BP 126/86 | HR 70 | Temp 98.4°F | Ht 64.5 in | Wt 180.0 lb

## 2016-05-24 DIAGNOSIS — N200 Calculus of kidney: Secondary | ICD-10-CM

## 2016-05-24 DIAGNOSIS — I10 Essential (primary) hypertension: Secondary | ICD-10-CM | POA: Diagnosis not present

## 2016-05-24 DIAGNOSIS — N12 Tubulo-interstitial nephritis, not specified as acute or chronic: Secondary | ICD-10-CM | POA: Diagnosis not present

## 2016-05-24 DIAGNOSIS — R12 Heartburn: Secondary | ICD-10-CM | POA: Insufficient documentation

## 2016-05-24 LAB — RENAL FUNCTION PANEL
ALBUMIN: 3.1 g/dL — AB (ref 3.6–5.1)
BUN: 13 mg/dL (ref 7–25)
CALCIUM: 8.3 mg/dL — AB (ref 8.6–10.2)
CHLORIDE: 104 mmol/L (ref 98–110)
CO2: 23 mmol/L (ref 20–31)
CREATININE: 0.68 mg/dL (ref 0.50–1.10)
Glucose, Bld: 113 mg/dL — ABNORMAL HIGH (ref 65–99)
Phosphorus: 2.8 mg/dL (ref 2.5–4.5)
Potassium: 3.9 mmol/L (ref 3.5–5.3)
Sodium: 137 mmol/L (ref 135–146)

## 2016-05-24 LAB — PHOSPHORUS: Phosphorus: 2.8 mg/dL (ref 2.5–4.5)

## 2016-05-24 MED ORDER — OMEPRAZOLE 40 MG PO CPDR: 40.0000 mg | DELAYED_RELEASE_CAPSULE | Freq: Every day | ORAL | 0 refills | Status: DC

## 2016-05-24 NOTE — Progress Notes (Signed)
Subjective: Chief Complaint  Patient presents with  . Hospitalization Follow-up    kidney infection, patient feeling much better   Here for f/u on pyelonephritis.  Was admitted 05/13/16 - [id number] for complicated pyelonephritis, suspected right duplex kidney with upper pole hydronephrosis, left peripelvic renal cyst, non complex, nephrolithiasis, no currently obstructing stones, and hypertension.    Spironolactone was withheld due to low BP and iron was held to avoid decrease efficacy of Cipro.    It was recommended she have metabolic eval given low normal calcium in serum suggests possible renal leak hypercalcuria.    She notes years ago when she had several hospitalizations for pyelo that she had surgery for staghorn calculi.    She feels much better today, has 1 more day of antibiotics.   No fever, no urinary c/o today.   Just tired.  She has had some heartburn the past few days, using tums.   Past Medical History:  Diagnosis Date  . Abnormal EKG    long QT  . Abnormal pap    lSIL   . Abnormal Pap smear   . Anemia, iron deficiency   . Anxiety   . Asthma    childhood  . BV (bacterial vaginosis) 03/06/2016  . Complication of anesthesia    States "when I had kidney stones removed(Open); I had a build up of fluid in my lungs. But I dont really remember much about it otherwise".  . Contraceptive management 07/28/2013  . Headache(784.0)   . Herpes 04/05/2016  . History of recurrent UTIs   . Human papilloma virus   . Hypertension   . Hypokalemia    being addressed with her PMD  . Kidney stones   . Menorrhagia   . Nephrolithiasis   . Onychomycosis   . Overweight(278.02)   . Proteinuria   . Urinary urgency 03/06/2016  . Uterine fibroid   . Vaginal discharge 03/06/2016  . Vaginal irritation 03/06/2016  . Wears contact lenses    Current Outpatient Prescriptions on File Prior to Visit  Medication Sig Dispense Refill  . acetaminophen (TYLENOL) 325 MG tablet Take 650 mg by mouth every  6 (six) hours as needed for fever (pain).    Marland Kitchen amLODipine (NORVASC) 10 MG tablet TAKE 1 TABLET BY MOUTH DAILY 30 tablet 3  . diclofenac (VOLTAREN) 75 MG EC tablet Take 75 mg by mouth 2 (two) times daily.    . Norgestimate-Ethinyl Estradiol Triphasic (TRI-PREVIFEM) 0.18/0.215/0.25 MG-35 MCG tablet Take 1 tablet by mouth daily.    Marland Kitchen ibuprofen (ADVIL,MOTRIN) 200 MG tablet Take 600 mg by mouth every 6 (six) hours as needed for fever (pain).    Marland Kitchen MAGNESIUM PO Take 1 tablet by mouth daily.     No current facility-administered medications on file prior to visit.      ROS as in subjective   Objective: BP 126/86 (BP Location: Right Arm, Patient Position: Sitting, Cuff Size: Normal)   Pulse 70   Temp 98.4 F (36.9 C) (Oral)   Ht 5' 4.5" (1.638 m)   Wt 180 lb (81.6 kg)   SpO2 96%   BMI 30.42 kg/m   Gen: wd, wn, nad Heart: rrr, normal s1, s2, no murmurs Lungs clear Abdomen: +bs, soft, non tender, no mass, no organomegaly Back: nontnender No Chovstek sign No edema Pulses WNL    Assessment: Encounter Diagnoses  Name Primary?  . Pyelonephritis Yes  . Renal stones   . Essential hypertension   . Heartburn      Plan:  Reviewed discharge summary, recommendations.  Labs today, consider consult back with urology  Finish cipro  We will plan to restart iron and spironolactone pending labs   Begin omeprazole short term for dyspepsia, GERD  Angel Morris was seen today for hospitalization follow-up.  Diagnoses and all orders for this visit:  Pyelonephritis -     Renal function panel -     PTH, Intact and Calcium -     VITAMIN D 25 Hydroxy (Vit-D Deficiency, Fractures) -     Phosphorus  Renal stones -     Renal function panel -     PTH, Intact and Calcium -     VITAMIN D 25 Hydroxy (Vit-D Deficiency, Fractures) -     Phosphorus  Essential hypertension -     Renal function panel -     PTH, Intact and Calcium -     VITAMIN D 25 Hydroxy (Vit-D Deficiency, Fractures) -      Phosphorus  Heartburn -     omeprazole (PRILOSEC) 40 MG capsule; Take 1 capsule (40 mg total) by mouth daily.

## 2016-05-25 ENCOUNTER — Other Ambulatory Visit: Payer: Self-pay | Admitting: Medical

## 2016-05-25 LAB — PTH, INTACT AND CALCIUM
CALCIUM: 8.3 mg/dL — AB (ref 8.6–10.2)
PTH: 53 pg/mL (ref 14–64)

## 2016-05-25 LAB — VITAMIN D 25 HYDROXY (VIT D DEFICIENCY, FRACTURES): Vit D, 25-Hydroxy: 19 ng/mL — ABNORMAL LOW (ref 30–100)

## 2016-05-25 MED ORDER — VITAMIN D (ERGOCALCIFEROL) 1.25 MG (50000 UNIT) PO CAPS: 50000.0000 [IU] | ORAL_CAPSULE | ORAL | 1 refills | Status: DC

## 2016-05-25 MED ORDER — AMLODIPINE BESYLATE 10 MG PO TABS: 10.0000 mg | ORAL_TABLET | Freq: Every day | ORAL | 1 refills | Status: DC

## 2016-05-25 MED ORDER — SPIRONOLACTONE 25 MG PO TABS: 25.0000 mg | ORAL_TABLET | Freq: Every day | ORAL | 2 refills | Status: DC

## 2016-06-08 ENCOUNTER — Ambulatory Visit (INDEPENDENT_AMBULATORY_CARE_PROVIDER_SITE_OTHER): Payer: BLUE CROSS/BLUE SHIELD | Admitting: Medical

## 2016-06-08 ENCOUNTER — Encounter: Payer: Self-pay | Admitting: Medical

## 2016-06-08 ENCOUNTER — Other Ambulatory Visit: Payer: Self-pay | Admitting: *Deleted

## 2016-06-08 VITALS — BP 122/88 | HR 82 | Ht 64.5 in | Wt 178.1 lb

## 2016-06-08 DIAGNOSIS — E559 Vitamin D deficiency, unspecified: Secondary | ICD-10-CM

## 2016-06-08 DIAGNOSIS — N200 Calculus of kidney: Secondary | ICD-10-CM

## 2016-06-08 DIAGNOSIS — N12 Tubulo-interstitial nephritis, not specified as acute or chronic: Secondary | ICD-10-CM

## 2016-06-08 DIAGNOSIS — I10 Essential (primary) hypertension: Secondary | ICD-10-CM

## 2016-06-08 DIAGNOSIS — D72829 Elevated white blood cell count, unspecified: Secondary | ICD-10-CM | POA: Diagnosis not present

## 2016-06-08 DIAGNOSIS — R9431 Abnormal electrocardiogram [ECG] [EKG]: Secondary | ICD-10-CM | POA: Diagnosis not present

## 2016-06-08 DIAGNOSIS — Z23 Encounter for immunization: Secondary | ICD-10-CM | POA: Diagnosis not present

## 2016-06-08 DIAGNOSIS — Z0000 Encounter for general adult medical examination without abnormal findings: Secondary | ICD-10-CM | POA: Diagnosis not present

## 2016-06-08 MED ORDER — AMLODIPINE BESYLATE 10 MG PO TABS: 10.0000 mg | ORAL_TABLET | Freq: Every day | ORAL | 3 refills | Status: DC

## 2016-06-08 NOTE — Patient Instructions (Signed)
Dr. Duard Spiewak [place], dentist [address], [place], [place] [address] [phone] [email or web address]    

## 2016-06-08 NOTE — Progress Notes (Addendum)
Subjective:   HPI  Angel Morris is a 40 y.o. female who presents for a complete physical.  Medical team: [facility], [place], PA-C here for primary care  Gets flu shot free at work  I saw her recently for hospital f/u for pyelonephritis and renal stone  Reviewed their medical, surgical, family, social, medication, and allergy history and updated chart as appropriate.  Past Medical History:  Diagnosis Date  . Abnormal pap    lSIL   . Anemia, iron deficiency    in past, not as of 05/2016  . Anxiety   . Asthma    childhood  . Complication of anesthesia    States "when I had kidney stones removed(Open); I had a build up of fluid in my lungs. But I dont really remember much about it otherwise".  . Contraceptive management 07/28/2013  . Herpes 04/05/2016  . History of recurrent UTIs   . Human papilloma virus   . Hypertension   . Kidney stones   . Menorrhagia   . Nephrolithiasis   . Overweight(278.02)   . Proteinuria   . Uterine fibroid   . Wears contact lenses     Past Surgical History:  Procedure Laterality Date  . CERVICAL CONIZATION W/BX N/A 10/08/2013   Procedure: CONIZATION CERVIX WITH BIOPSY;  Surgeon: Florian Buff, MD;  Location: [facility];  Service: Gynecology;  Laterality: N/A;  . DILATION AND CURETTAGE OF UTERUS    . DILITATION & CURRETTAGE/HYSTROSCOPY WITH NOVASURE ABLATION N/A 07/02/2015   Procedure: DILATATION & CURETTAGE/HYSTEROSCOPY WITH NOVASURE ABLATION;  Surgeon: Florian Buff, MD;  Location: [facility];  Service: Gynecology;  Laterality: N/A;  Uterine Cavity Length 5.5 Uterine Cavity Width 3.5 Power 106 Time 1 minute 4 seconds  . HOLMIUM LASER APPLICATION N/A [id number]   Procedure: HOLMIUM LASER APPLICATION;  Surgeon: Florian Buff, MD;  Location: [facility];  Service: Gynecology;  Laterality: N/A;  . kidney stones  june 2000   open removal  . rotary cuff - left shoulder  2005  . ROTATOR CUFF REPAIR Left     Social History   Social  History  . Marital status: Divorced    Spouse name: N/A  . Number of children: N/A  . Years of education: N/A   Occupational History  . Not on file.   Social History Main Topics  . Smoking status: Never Smoker  . Smokeless tobacco: Never Used  . Alcohol use Yes     Comment: rare  . Drug use: No  . Sexual activity: Yes    Birth control/ protection: Pill, Surgical     Comment: ablation   Other Topics Concern  . Not on file   Social History Narrative   Lives with boyfriend x 3 years, 4 children, 3 live with her.   Works at [employer], works in Academic librarian.  Exercise - limited.   As of 03/2015    Family History  Problem Relation Age of Onset  . Fibromyalgia Mother   . Arthritis Mother   . Diabetes Mother   . Hypertension Mother   . Diabetes Brother   . Diabetes Father   . Stroke Maternal Grandmother   . Aneurysm Maternal Grandfather   . Stroke Maternal Grandfather   . Cancer Neg Hx   . Heart disease Neg Hx      Current Outpatient Prescriptions:  .  acetaminophen (TYLENOL) 325 MG tablet, Take 650 mg by mouth every 6 (six) hours as  needed for fever (pain)., Disp: , Rfl:  .  amLODipine (NORVASC) 10 MG tablet, Take 1 tablet (10 mg total) by mouth daily., Disp: 90 tablet, Rfl: 3 .  diclofenac (VOLTAREN) 75 MG EC tablet, Take 75 mg by mouth 2 (two) times daily., Disp: , Rfl:  .  ibuprofen (ADVIL,MOTRIN) 200 MG tablet, Take 600 mg by mouth every 6 (six) hours as needed for fever (pain)., Disp: , Rfl:  .  MAGNESIUM PO, Take 1 tablet by mouth daily., Disp: , Rfl:  .  Norgestimate-Ethinyl Estradiol Triphasic (TRI-PREVIFEM) 0.18/0.215/0.25 MG-35 MCG tablet, Take 1 tablet by mouth daily., Disp: , Rfl:  .  omeprazole (PRILOSEC) 40 MG capsule, Take 1 capsule (40 mg total) by mouth daily., Disp: 30 capsule, Rfl: 0 .  spironolactone (ALDACTONE) 25 MG tablet, Take 1 tablet (25 mg total) by mouth daily., Disp: 30 tablet, Rfl: 2 .  Vitamin D, Ergocalciferol, (DRISDOL) 50000  units CAPS capsule, Take 1 capsule (50,000 Units total) by mouth every 7 (seven) days., Disp: 12 capsule, Rfl: 1  No Known Allergies   Review of Systems Constitutional: -fever, -chills, -sweats, -unexpected weight change, -decreased appetite, -fatigue Allergy: -sneezing, -itching, -congestion Dermatology: -changing moles, --rash, -lumps ENT: -runny nose, -ear pain, -sore throat, -hoarseness, -sinus pain, -teeth pain, - ringing in ears, -hearing loss, -nosebleeds Cardiology: -chest pain, -palpitations, -swelling, -difficulty breathing when lying flat, -waking up short of breath Respiratory: -cough, -shortness of breath, -difficulty breathing with exercise or exertion, -wheezing, -coughing up blood Gastroenterology: -abdominal pain, -nausea, -vomiting, -diarrhea, -constipation, -blood in stool, -changes in bowel movement, -difficulty swallowing or eating Hematology: -bleeding, -bruising  Musculoskeletal: -joint aches, -muscle aches, -joint swelling, -back pain, -neck pain, -cramping, -changes in gait Ophthalmology: denies vision changes, eye redness, itching, discharge Urology: -burning with urination, -difficulty urinating, -blood in urine, -urinary frequency, -urgency, -incontinence Neurology: -headache, -weakness, -tingling, -numbness, -memory loss, -falls, -dizziness Psychology: -depressed mood, -agitation, -sleep problems     Objective:   Physical Exam  BP 122/88   Pulse 82   Ht 5' 4.5" (1.638 m)   Wt 178 lb 2 oz (80.8 kg)   SpO2 98%   BMI 30.10 kg/m   General appearance: alert, no distress, WD/WN, AA female Skin: no worrisome findings HEENT: normocephalic, conjunctiva/corneas normal, sclerae anicteric, PERRLA, EOMi, nares patent, no discharge or erythema, pharynx normal Oral cavity: MMM, tongue normal, teeth in good repair Neck: supple, no lymphadenopathy, no thyromegaly, no masses, normal ROM, no bruits Chest: non tender, normal shape and expansion Heart: RRR, normal S1,  S2, no murmurs Lungs: CTA bilaterally, no wheezes, rhonchi, or rales Abdomen: +bs, soft, non tender, non distended, no masses, no hepatomegaly, no splenomegaly, no bruits Back: non tender, normal ROM, no scoliosis Musculoskeletal: left deltoid surgical scars, otherwise upper extremities non tender, no obvious deformity, normal ROM throughout, lower extremities non tender, no obvious deformity, normal ROM throughout Extremities: no edema, no cyanosis, no clubbing Pulses: 2+ symmetric, upper and lower extremities, normal cap refill Neurological: alert, oriented x 3, CN2-12 intact, strength normal upper extremities and lower extremities, sensation normal throughout, DTRs 2+ throughout, no cerebellar signs, gait normal Psychiatric: normal affect, behavior normal, pleasant  Breast/gyn/rectal - deferred to gyn  Assessment and Plan :    Encounter Diagnoses  Name Primary?  . Encounter for health maintenance examination in adult Yes  . Encounter for immunization   . Essential hypertension   . Pyelonephritis   . Renal stones   . Prolonged Q-T interval on ECG   . Hypocalcemia   .  Leukocytosis, unspecified type   . Vitamin D deficiency    Physical exam - discussed healthy lifestyle, diet, exercise, preventative care, vaccinations, and addressed their concerns.   See your eye doctor yearly for routine vision care. See your dentist yearly for routine dental care including hygiene visits twice yearly. See your gynecologist yearly for routine gynecological care. C/t same medications reviewed recent labs, and c/t plan for Urology f/u regarding stones, pyelo recurrent Complete her biometric form for work Counseled on the influenza virus vaccine.  Vaccine information sheet given.  Influenza vaccine given after consent obtained. Follow-up for urology consult.

## 2016-06-21 ENCOUNTER — Ambulatory Visit (INDEPENDENT_AMBULATORY_CARE_PROVIDER_SITE_OTHER): Payer: BLUE CROSS/BLUE SHIELD | Admitting: Podiatry

## 2016-06-21 DIAGNOSIS — M779 Enthesopathy, unspecified: Secondary | ICD-10-CM

## 2016-06-21 MED ORDER — TRIAMCINOLONE ACETONIDE 10 MG/ML IJ SUSP
10.0000 mg | Freq: Once | INTRAMUSCULAR | Status: AC
  Administered 2016-06-21: 10 mg

## 2016-06-22 NOTE — Progress Notes (Signed)
Subjective:     Patient ID: Angel Morris, female   DOB: [birth year], 40 y.o.   MRN: [id number]  HPI patient presents stating that on the right foot she states it started to have pain again in several weeks ago and that for a couple months it was doing pretty good but she was wearing some flat shoes   Review of Systems     Objective:   Physical Exam Neurovascular status intact with patient having pain in the right medial ankle with inflammation noted upon palpation with no indication of muscle function loss. There is significant structural flatfoot deformity right over left    Assessment:     Condition that appears to be due to muscle and bone structure with inflammation of the posterior tibial tendon    Plan:     H&P condition reviewed and recommended long-term orthotics and to go back to the boot for the next week. Since there is quite a bit of inflammation the sheath I did do a careful injection first explaining the chances for rupture of the sheath of posterior tib and I then went ahead and I scanned for custom orthotics that we will make with 10 of left in order to provide for support. She'll be seen back when they are ready or earlier if any issues should occur

## 2016-06-26 ENCOUNTER — Ambulatory Visit: Payer: BLUE CROSS/BLUE SHIELD | Admitting: Podiatry

## 2016-07-11 ENCOUNTER — Other Ambulatory Visit: Payer: Self-pay | Admitting: Medical

## 2016-07-11 DIAGNOSIS — R12 Heartburn: Secondary | ICD-10-CM

## 2016-07-12 ENCOUNTER — Ambulatory Visit (INDEPENDENT_AMBULATORY_CARE_PROVIDER_SITE_OTHER): Payer: BLUE CROSS/BLUE SHIELD

## 2016-07-12 DIAGNOSIS — M779 Enthesopathy, unspecified: Secondary | ICD-10-CM

## 2016-07-12 NOTE — Patient Instructions (Signed)

## 2016-07-12 NOTE — Progress Notes (Signed)
Patient presents for orthotic pick up.  Verbal and written break in and wear instructions given.  Patient will follow up in 4 weeks if symptoms worsen or fail to improve. 

## 2016-10-08 ENCOUNTER — Other Ambulatory Visit: Payer: Self-pay | Admitting: Obstetrics & Gynecology

## 2016-10-16 ENCOUNTER — Other Ambulatory Visit: Payer: Self-pay | Admitting: Medical

## 2016-10-16 NOTE — Telephone Encounter (Signed)
Is this okay to refill? Pt cpe was 05/2016

## 2016-12-05 IMAGING — CT CT RENAL STONE PROTOCOL
2 of 4 series · 15 of 46 positions shown, 17 images · non-contrast
Comparison: 07/26/2006 CT

CLINICAL DATA: 40-year-old female with bilateral flank and
abdominal pain for 1 week, right greater than left.

EXAM:
CT ABDOMEN AND PELVIS WITHOUT CONTRAST
TECHNIQUE: Multidetector CT imaging of the abdomen and pelvis was performed
following the standard protocol without IV contrast.

[Series 2: stone study 5.0 i30f 1 · axial · 0.72mm/px · z∈[+807,+1237]mm · 12 of 98 slices shown, 14 images]
[im 8/98  soft-tissue]
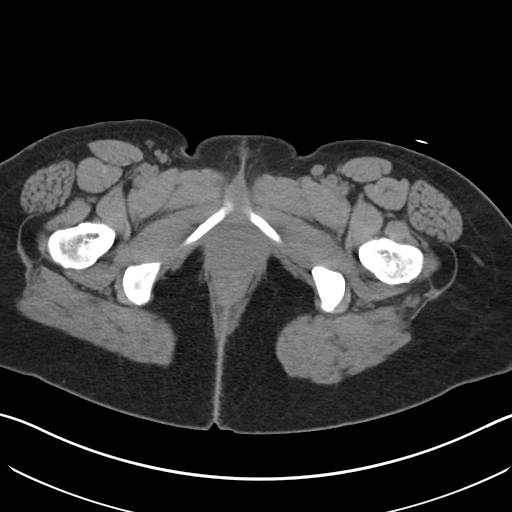
[im 8/98  bone]
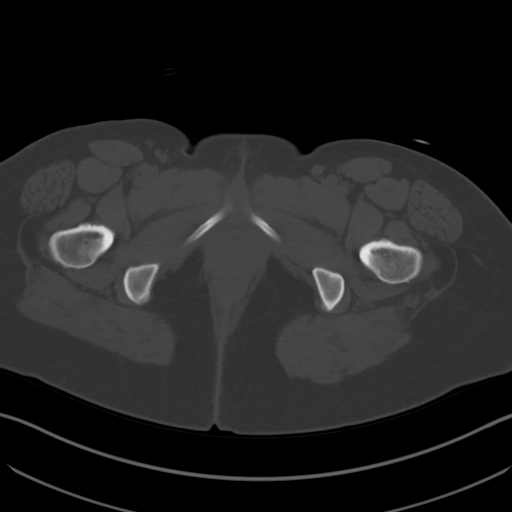
[im 16/98  soft-tissue]
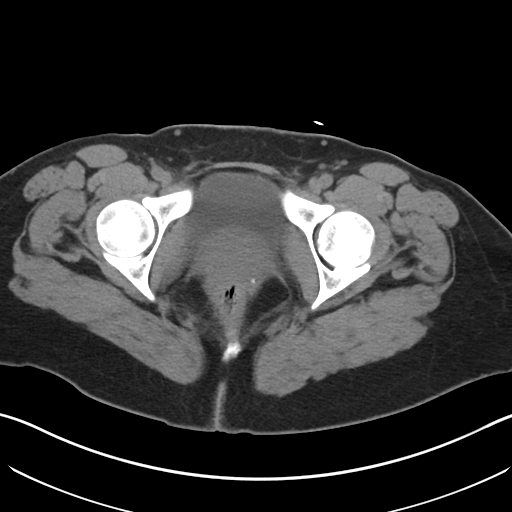
[im 24/98  soft-tissue]
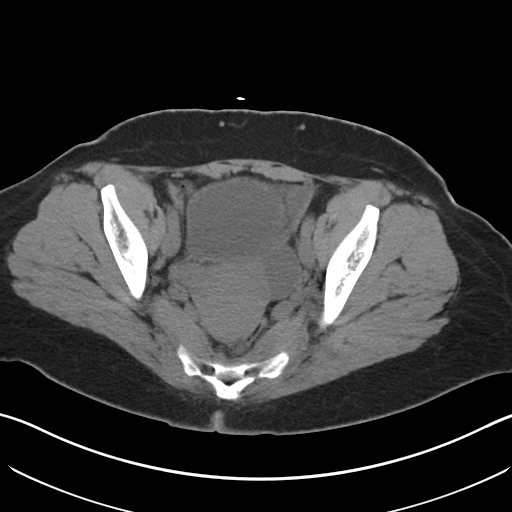
[im 32/98  soft-tissue]
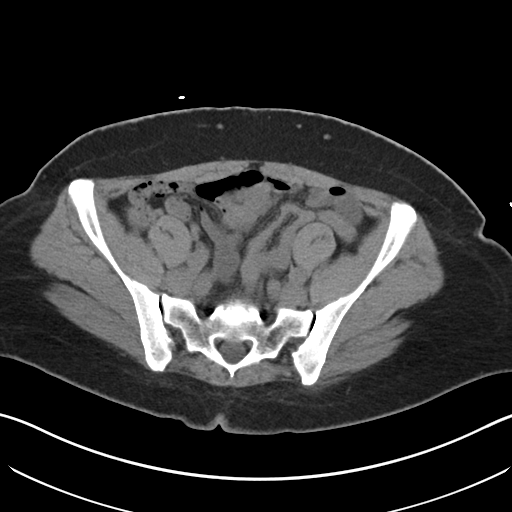
[im 39/98  soft-tissue]
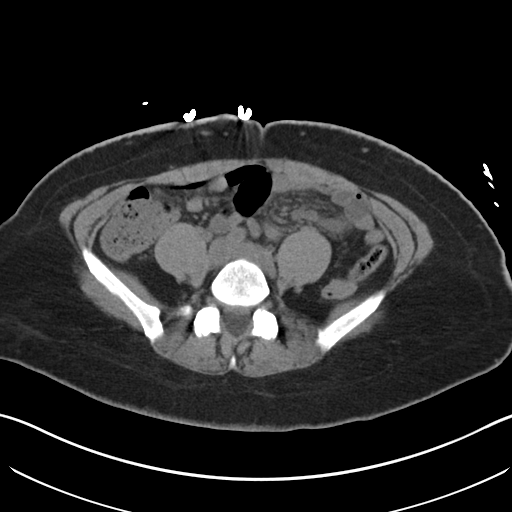
[im 47/98  soft-tissue]
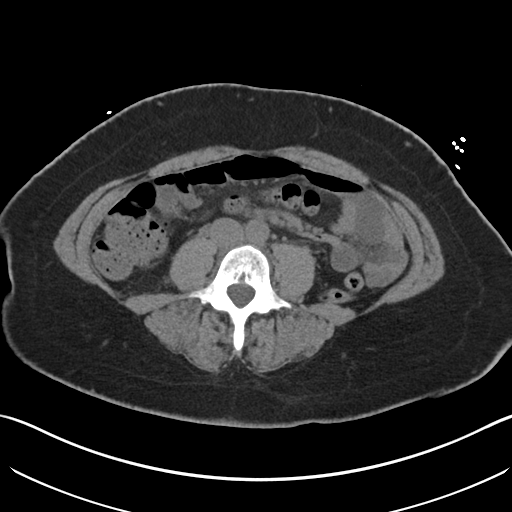
[im 55/98  soft-tissue]
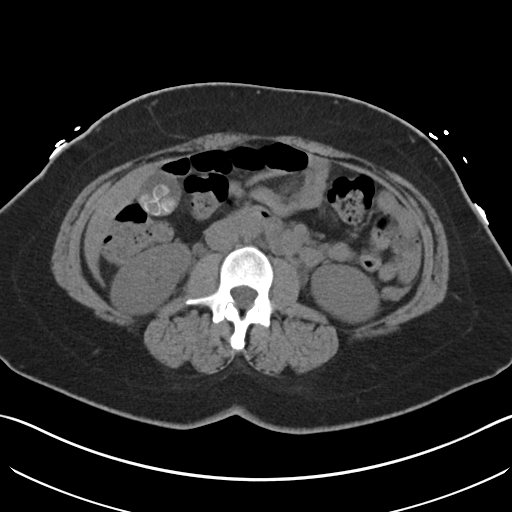
[im 63/98  soft-tissue]
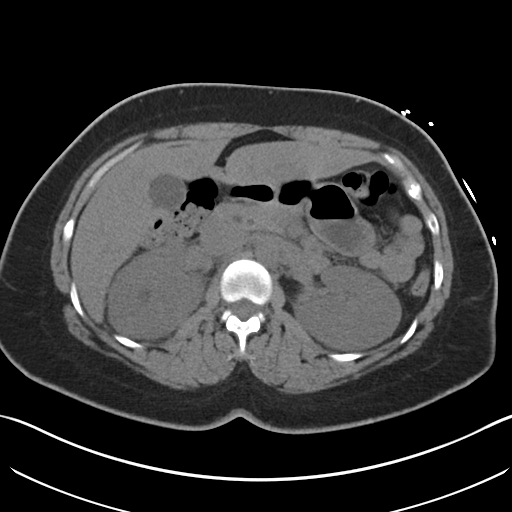
[im 70/98  soft-tissue]
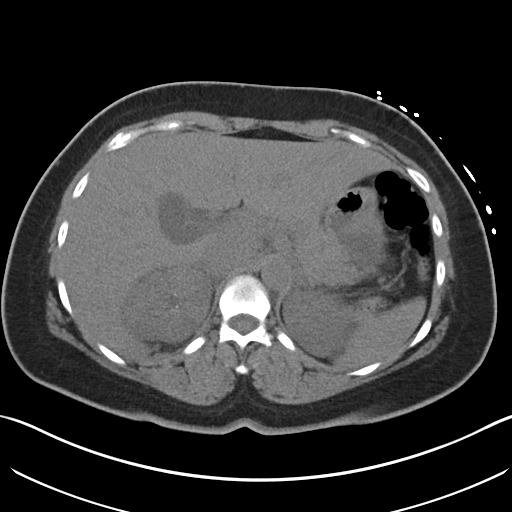
[im 70/98  bone]
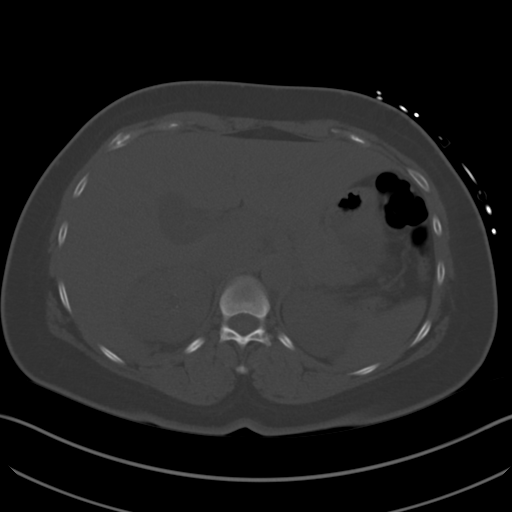
[im 78/98  soft-tissue]
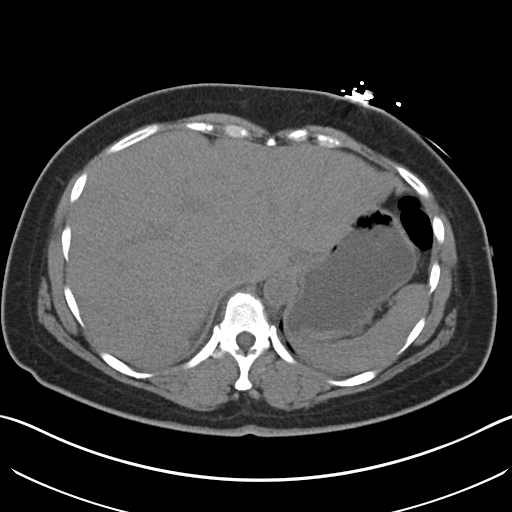
[im 86/98  soft-tissue]
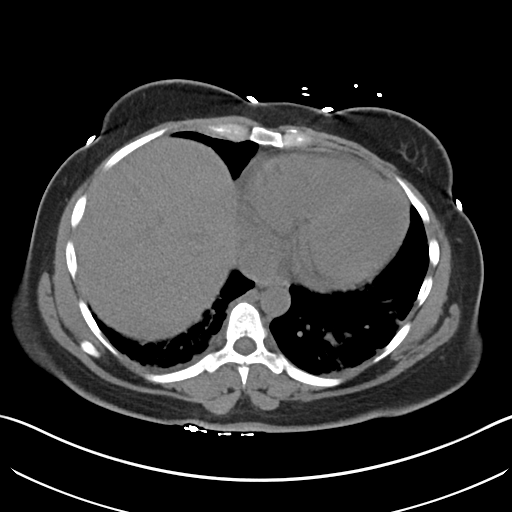
[im 94/98  soft-tissue]
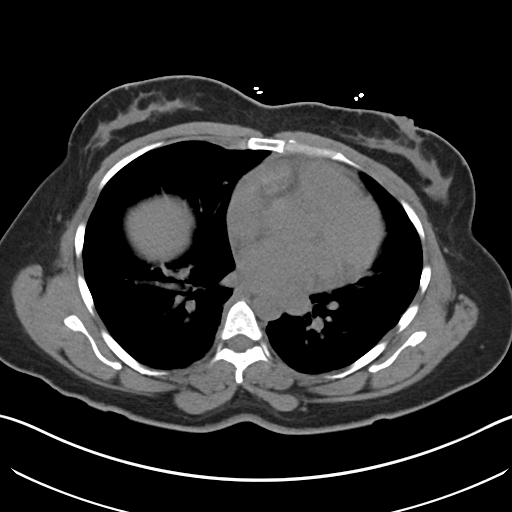

[Series 5: coronal soft tissue · coronal · 0.81mm/px · 3 of 101 slices shown]
[im 34/101  soft-tissue]
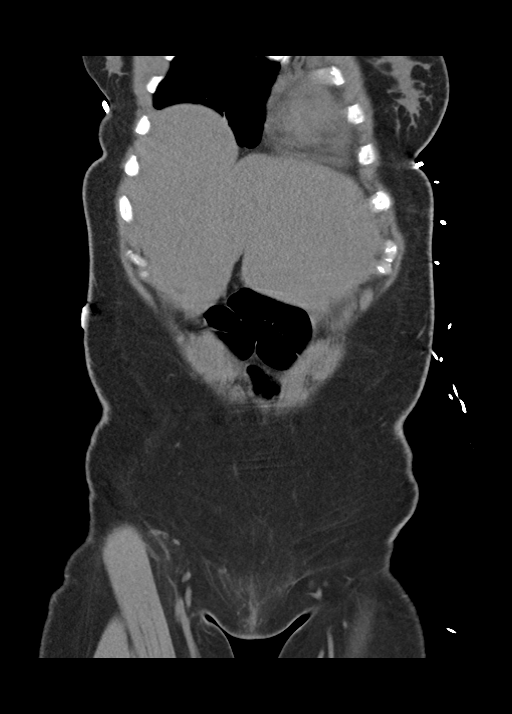
[im 45/101  soft-tissue]
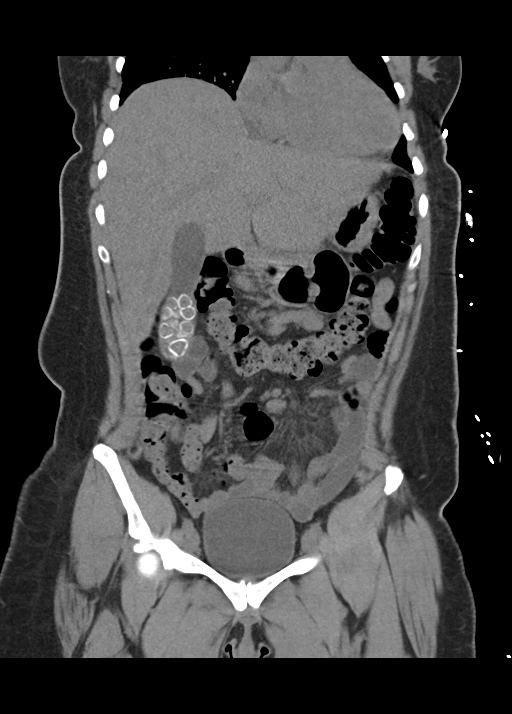
[im 56/101  soft-tissue]
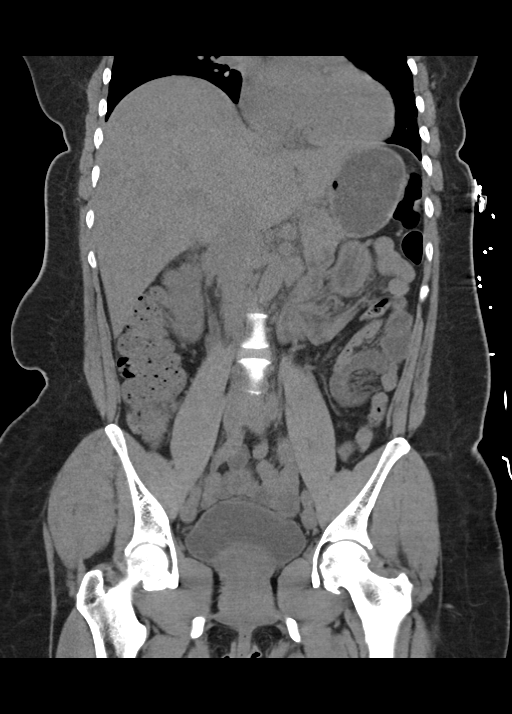

[15 of 46 positions shown; findings below may reference images not displayed]

FINDINGS: Please note that parenchymal abnormalities may be missed without
intravenous contrast.

Lower chest: Mild bibasilar atelectasis identified.

Hepatobiliary: The liver is unremarkable. Multiple calcified
gallstones are noted without CT evidence of acute cholecystitis.
There is no evidence of biliary dilatation.

Pancreas: Unremarkable

Spleen: Unremarkable

Adrenals/Urinary Tract: A duplicated right renal collecting system
is noted with hydronephrosis of the upper moiety, slightly increased
since 5229. The right ureter is duplicated at least proximally. No
definite obstructing calculus or cause identified. Mild enhancement
of the right ureteral wall may represent inflammation or infection.
Multiple nonobstructing bilateral renal calculi are identified. A
probable left renal cyst is present. The adrenal glands and bladder
are unremarkable.

Stomach/Bowel: Unremarkable. No bowel obstruction or definite bowel
wall thickening.

Vascular/Lymphatic: Unremarkable. No abdominal aortic aneurysm or
enlarged lymph nodes.

Reproductive: Unremarkable except for a 3.5 cm benign-appearing left
ovarian cyst.

Other: No free fluid, focal collection or pneumoperitoneum.

Musculoskeletal: No acute or suspicious abnormality.
IMPRESSION: Duplicated right renal collecting system with hydronephrosis of the
upper moiety, slightly increased since 5229. No definite obstructing
cause identified. Mild enhancement of the right ureteral wall may
represent inflammation or infection.

Multiple nonobstructing bilateral renal calculi.

Cholelithiasis without CT evidence of acute cholecystitis.

## 2017-01-08 ENCOUNTER — Other Ambulatory Visit: Payer: Self-pay | Admitting: Medical

## 2017-01-08 DIAGNOSIS — R12 Heartburn: Secondary | ICD-10-CM

## 2017-01-12 ENCOUNTER — Telehealth: Payer: Self-pay | Admitting: *Deleted

## 2017-01-12 MED ORDER — METRONIDAZOLE 0.75 % VA GEL: 1.0000 | Freq: Two times a day (BID) | VAGINAL | 0 refills | Status: DC

## 2017-01-12 NOTE — Telephone Encounter (Signed)
Has BV wants Metrogel , will send rx

## 2017-02-08 ENCOUNTER — Other Ambulatory Visit: Payer: Self-pay | Admitting: Medical

## 2017-02-13 ENCOUNTER — Encounter: Payer: Self-pay | Admitting: Medical

## 2017-03-18 ENCOUNTER — Other Ambulatory Visit: Payer: Self-pay | Admitting: Medical

## 2017-03-18 DIAGNOSIS — R12 Heartburn: Secondary | ICD-10-CM

## 2017-04-24 ENCOUNTER — Other Ambulatory Visit: Payer: Self-pay | Admitting: Medical

## 2017-04-24 DIAGNOSIS — R12 Heartburn: Secondary | ICD-10-CM

## 2017-05-07 ENCOUNTER — Other Ambulatory Visit: Payer: Self-pay | Admitting: Medical

## 2017-06-04 ENCOUNTER — Other Ambulatory Visit: Payer: Self-pay | Admitting: Medical

## 2017-06-15 ENCOUNTER — Encounter: Payer: Self-pay | Admitting: Medical

## 2017-06-15 ENCOUNTER — Ambulatory Visit (INDEPENDENT_AMBULATORY_CARE_PROVIDER_SITE_OTHER): Payer: BLUE CROSS/BLUE SHIELD | Admitting: Medical

## 2017-06-15 VITALS — BP 128/76 | HR 68 | Ht 65.0 in | Wt 174.4 lb

## 2017-06-15 DIAGNOSIS — Z833 Family history of diabetes mellitus: Secondary | ICD-10-CM

## 2017-06-15 DIAGNOSIS — R4586 Emotional lability: Secondary | ICD-10-CM | POA: Diagnosis not present

## 2017-06-15 DIAGNOSIS — Z7185 Encounter for immunization safety counseling: Secondary | ICD-10-CM | POA: Insufficient documentation

## 2017-06-15 DIAGNOSIS — Z7189 Other specified counseling: Secondary | ICD-10-CM | POA: Diagnosis not present

## 2017-06-15 DIAGNOSIS — Z87442 Personal history of urinary calculi: Secondary | ICD-10-CM | POA: Diagnosis not present

## 2017-06-15 DIAGNOSIS — R5383 Other fatigue: Secondary | ICD-10-CM | POA: Diagnosis not present

## 2017-06-15 DIAGNOSIS — I10 Essential (primary) hypertension: Secondary | ICD-10-CM

## 2017-06-15 DIAGNOSIS — E559 Vitamin D deficiency, unspecified: Secondary | ICD-10-CM | POA: Diagnosis not present

## 2017-06-15 DIAGNOSIS — Z0000 Encounter for general adult medical examination without abnormal findings: Secondary | ICD-10-CM

## 2017-06-15 DIAGNOSIS — Z87448 Personal history of other diseases of urinary system: Secondary | ICD-10-CM | POA: Insufficient documentation

## 2017-06-15 DIAGNOSIS — M79671 Pain in right foot: Secondary | ICD-10-CM

## 2017-06-15 LAB — POCT URINALYSIS DIP (PROADVANTAGE DEVICE)
Bilirubin, UA: NEGATIVE
Glucose, UA: NEGATIVE mg/dL
Ketones, POC UA: NEGATIVE mg/dL
Leukocytes, UA: NEGATIVE
NITRITE UA: NEGATIVE
PH UA: 6.5 (ref 5.0–8.0)
PROTEIN UA: NEGATIVE mg/dL
RBC UA: NEGATIVE
Specific Gravity, Urine: 1.02
UUROB: NEGATIVE

## 2017-06-15 NOTE — Patient Instructions (Signed)
[facility] [address], [place], [place] [address] [phone]    [facility] [phone]  [email or web address] [address] [place], [place], [place] [address]  Rema Fendt, therapist  Or Toy Cookey, MA, clinical psychologist    Verneda Skill. Clarene Reamer, therapist [phone] [address] [place], [place] [address]   [facility] [phone] [address] [place], [place] [address]   Vic Ripper, therapist [phone] [address] [place], [place] [address]   [facility] [address] [place], [place], [place] [address]

## 2017-06-15 NOTE — Progress Notes (Signed)
Subjective:   HPI  Angel Morris is a 41 y.o. female who presents for a complete physical.  Medical team: [facility] [place] for [facility] Eye doctor Glade Lloyd, Leward Quan here for primary care  Gets flu shot free at work  Concerns: Father passed away back in 01-Feb-2023 of MI  Having some mood issues, father passed, having stress.   Doesn't feel she has a lot of energy. No hx/o sleep apnea  Right foot/ankle issue - has seen podiatrist about this prior, had MRI this past year.  Wore a boot for a while, improved some, but pain is coming back.  No recent injury or trauma.  She notes having sleep study years ago, but thinks it was fine.  Reviewed their medical, surgical, family, social, medication, and allergy history and updated chart as appropriate.  Past Medical History:  Diagnosis Date  . Abnormal pap    lSIL in the past  . Anemia, iron deficiency    in past, not as of 05/2016  . Anxiety   . Asthma    childhood  . Complication of anesthesia    States "when I had kidney stones removed(Open); I had a build up of fluid in my lungs. But I dont really remember much about it otherwise".  . Herpes 04/05/2016  . History of pyelonephritis   . History of recurrent UTIs   . Human papilloma virus   . Hypertension   . Kidney stones   . Menorrhagia   . Nephrolithiasis   . Overweight(278.02)   . Proteinuria   . Renal stone   . Uterine fibroid   . Wears contact lenses     Past Surgical History:  Procedure Laterality Date  . CERVICAL CONIZATION W/BX N/A 10/08/2013   Procedure: CONIZATION CERVIX WITH BIOPSY;  Surgeon: Florian Buff, MD;  Location: [facility];  Service: Gynecology;  Laterality: N/A;  . DILATION AND CURETTAGE OF UTERUS    . DILITATION & CURRETTAGE/HYSTROSCOPY WITH NOVASURE ABLATION N/A 07/02/2015   Procedure: DILATATION & CURETTAGE/HYSTEROSCOPY WITH NOVASURE ABLATION;  Surgeon: Florian Buff, MD;  Location: [facility];  Service: Gynecology;  Laterality: N/A;   Uterine Cavity Length 5.5 Uterine Cavity Width 3.5 Power 106 Time 1 minute 4 seconds  . HOLMIUM LASER APPLICATION N/A 8/93/8101   Procedure: HOLMIUM LASER APPLICATION;  Surgeon: Florian Buff, MD;  Location: [facility];  Service: Gynecology;  Laterality: N/A;  . kidney stones  june 2000   open removal  . rotary cuff - left shoulder  2005    Social History   Social History  . Marital status: Divorced    Spouse name: N/A  . Number of children: N/A  . Years of education: N/A   Occupational History  . Not on file.   Social History Main Topics  . Smoking status: Never Smoker  . Smokeless tobacco: Never Used  . Alcohol use Yes     Comment: rare  . Drug use: No  . Sexual activity: Yes    Birth control/ protection: Pill, Surgical     Comment: ablation   Other Topics Concern  . Not on file   Social History Narrative   Lives with 2 of her children, has 4 children.   Works at [employer], works in Academic librarian.  Exercise - just walking on the job.   As of 05/2017    Family History  Problem Relation Age of Onset  . Fibromyalgia Mother   . Arthritis Mother  RA  . Hypertension Mother   . Diabetes Mother   . Diabetes Brother   . Hypertension Brother   . Diabetes Father        diabetes, amputation  . Heart disease Father 46       died of MI 01/30/17  . Kidney disease Father        dialysis  . Stroke Maternal Grandmother   . Aneurysm Maternal Grandfather   . Stroke Maternal Grandfather   . Cancer Neg Hx      Current Outpatient Prescriptions:  .  acetaminophen (TYLENOL) 325 MG tablet, Take 650 mg by mouth every 6 (six) hours as needed for fever (pain)., Disp: , Rfl:  .  amLODipine (NORVASC) 10 MG tablet, Take 1 tablet (10 mg total) by mouth daily., Disp: 90 tablet, Rfl: 3 .  ibuprofen (ADVIL,MOTRIN) 200 MG tablet, Take 600 mg by mouth every 6 (six) hours as needed for fever (pain)., Disp: , Rfl:  .  metroNIDAZOLE (METROGEL VAGINAL) 0.75 % vaginal gel, Place 1  Applicatorful vaginally 2 (two) times daily., Disp: 70 g, Rfl: 0 .  omeprazole (PRILOSEC) 40 MG capsule, TAKE 1 CAPSULE (40 MG TOTAL) BY MOUTH DAILY., Disp: 90 capsule, Rfl: 0 .  TRI-PREVIFEM 0.18/0.215/0.25 MG-35 MCG tablet, TAKE 1 TABLET BY MOUTH DAILY, Disp: 28 tablet, Rfl: 9 .  Vitamin D, Ergocalciferol, (DRISDOL) 50000 units CAPS capsule, TAKE 1 CAPSULE (50,000 UNITS TOTAL) BY MOUTH EVERY 7 (SEVEN) DAYS., Disp: 12 capsule, Rfl: 0  No Known Allergies   Review of Systems Constitutional: -fever, -chills, -sweats, -unexpected weight change, -decreased appetite, +fatigue Allergy: -sneezing, -itching, -congestion Dermatology: -changing moles, --rash, -lumps ENT: -runny nose, -ear pain, -sore throat, -hoarseness, -sinus pain, -teeth pain, - ringing in ears, -hearing loss, -nosebleeds Cardiology: -chest pain, -palpitations, -swelling, -difficulty breathing when lying flat, -waking up short of breath Respiratory: -cough, -shortness of breath, -difficulty breathing with exercise or exertion, -wheezing, -coughing up blood Gastroenterology: -abdominal pain, -nausea, -vomiting, -diarrhea, -constipation, -blood in stool, -changes in bowel movement, -difficulty swallowing or eating Hematology: -bleeding, -bruising  Musculoskeletal: +joint aches, -muscle aches, -joint swelling, -back pain, -neck pain, -cramping, -changes in gait Ophthalmology: denies vision changes, eye redness, itching, discharge Urology: -burning with urination, -difficulty urinating, -blood in urine, -urinary frequency, -urgency, -incontinence Neurology: -headache, -weakness, -tingling, -numbness, -memory loss, -falls, -dizziness Psychology: +depressed mood, -agitation, -sleep problems, -SI/HI     Objective:   Physical Exam  BP 128/76   Pulse 68   Ht 5\' 5"  (1.651 m)   Wt 174 lb 6.4 oz (79.1 kg)   BMI 29.02 kg/m   Wt Readings from Last 3 Encounters:  06/15/17 174 lb 6.4 oz (79.1 kg)  06/08/16 178 lb 2 oz (80.8 kg)   05/24/16 180 lb (81.6 kg)    General appearance: alert, no distress, WD/WN, AA female Skin: no worrisome findings HEENT: normocephalic, conjunctiva/corneas normal, sclerae anicteric, PERRLA, EOMi, nares patent, no discharge or erythema, pharynx normal Oral cavity: MMM, tongue normal, teeth in good repair Neck: supple, no lymphadenopathy, no thyromegaly, no masses, normal ROM, no bruits Chest: non tender, normal shape and expansion Heart: RRR, normal S1, S2, no murmurs Lungs: CTA bilaterally, no wheezes, rhonchi, or rales Abdomen: +bs, soft, non tender, non distended, no masses, no hepatomegaly, no splenomegaly, no bruits Back: non tender, normal ROM, no scoliosis Musculoskeletal: tender anterior right ankle, but normal ROM, no deformity other than flat feet, left deltoid surgical scars, otherwise upper extremities non tender, no obvious deformity, normal ROM throughout, lower  extremities non tender, no obvious deformity, normal ROM throughout Extremities: no edema, no cyanosis, no clubbing Pulses: 2+ symmetric, upper and lower extremities, normal cap refill Neurological: alert, oriented x 3, CN2-12 intact, strength normal upper extremities and lower extremities, sensation normal throughout, DTRs 2+ throughout, no cerebellar signs, gait normal Psychiatric: normal affect, behavior normal, pleasant  Breast/gyn/rectal - deferred to gyn    Assessment and Plan :    Encounter Diagnoses  Name Primary?  . Routine general medical examination at a health care facility Yes  . Vitamin D deficiency   . Essential hypertension   . History of pyelonephritis   . History of renal stone   . Right foot pain   . Mood change   . Vaccine counseling   . Family history of diabetes mellitus   . Other fatigue     Physical exam - discussed healthy lifestyle, diet, exercise, preventative care, vaccinations, and addressed their concerns.   Expressed my sympathy for the loss of her father this year. See  your eye doctor yearly for routine vision care. See your dentist yearly for routine dental care including hygiene visits twice yearly. See your gynecologist yearly for routine gynecological care. C/t same medications Complete her biometric form for work She had a flu shot at work recently Mood change - advised she pursue counseling.  Discussed ways to cope and reduce stress HTN - at goal.  reviewed 2016 stress test and echo reports fatigue - labs today Foot and ankle pain - reviewed podiatry notes in chart, discuses foot wear, stretching.  F/u pending labs Follow-up for urology consult.

## 2017-06-16 LAB — COMPREHENSIVE METABOLIC PANEL
AG Ratio: 1.2 (calc) (ref 1.0–2.5)
ALBUMIN MSPROF: 3.8 g/dL (ref 3.6–5.1)
ALT: 8 U/L (ref 6–29)
AST: 12 U/L (ref 10–30)
Alkaline phosphatase (APISO): 75 U/L (ref 33–115)
BUN: 10 mg/dL (ref 7–25)
CHLORIDE: 104 mmol/L (ref 98–110)
CO2: 25 mmol/L (ref 20–32)
CREATININE: 0.77 mg/dL (ref 0.50–1.10)
Calcium: 8.9 mg/dL (ref 8.6–10.2)
GLOBULIN: 3.1 g/dL (ref 1.9–3.7)
GLUCOSE: 109 mg/dL — AB (ref 65–99)
POTASSIUM: 3.8 mmol/L (ref 3.5–5.3)
Sodium: 138 mmol/L (ref 135–146)
Total Bilirubin: 0.7 mg/dL (ref 0.2–1.2)
Total Protein: 6.9 g/dL (ref 6.1–8.1)

## 2017-06-16 LAB — HEMOGLOBIN A1C
EAG (MMOL/L): 6.2 (calc)
HEMOGLOBIN A1C: 5.5 %{Hb} (ref <5.7)
MEAN PLASMA GLUCOSE: 111 (calc)

## 2017-06-16 LAB — LIPID PANEL
Cholesterol: 158 mg/dL (ref <200)
HDL: 89 mg/dL (ref >50)
LDL CHOLESTEROL (CALC): 52 mg/dL
Non-HDL Cholesterol (Calc): 69 mg/dL (calc) (ref <130)
Total CHOL/HDL Ratio: 1.8 (calc) (ref <5.0)
Triglycerides: 83 mg/dL (ref <150)

## 2017-06-16 LAB — CBC WITH DIFFERENTIAL/PLATELET
Basophils Absolute: 26 cells/uL (ref 0–200)
Basophils Relative: 0.3 %
EOS ABS: 53 {cells}/uL (ref 15–500)
Eosinophils Relative: 0.6 %
HCT: 38.5 % (ref 35.0–45.0)
Hemoglobin: 13.3 g/dL (ref 11.7–15.5)
Lymphs Abs: 2094 cells/uL (ref 850–3900)
MCH: 27.8 pg (ref 27.0–33.0)
MCHC: 34.5 g/dL (ref 32.0–36.0)
MCV: 80.5 fL (ref 80.0–100.0)
MPV: 12.5 fL (ref 7.5–12.5)
Monocytes Relative: 6.3 %
NEUTROS PCT: 69 %
Neutro Abs: 6072 cells/uL (ref 1500–7800)
PLATELETS: 229 10*3/uL (ref 140–400)
RBC: 4.78 10*6/uL (ref 3.80–5.10)
RDW: 13.3 % (ref 11.0–15.0)
TOTAL LYMPHOCYTE: 23.8 %
WBC: 8.8 10*3/uL (ref 3.8–10.8)
WBCMIX: 554 {cells}/uL (ref 200–950)

## 2017-06-16 LAB — TSH: TSH: 0.97 mIU/L

## 2017-06-16 LAB — VITAMIN D 25 HYDROXY (VIT D DEFICIENCY, FRACTURES): VIT D 25 HYDROXY: 38 ng/mL (ref 30–100)

## 2017-06-18 ENCOUNTER — Other Ambulatory Visit: Payer: Self-pay | Admitting: Medical

## 2017-06-18 MED ORDER — AMLODIPINE BESYLATE 10 MG PO TABS: 10.0000 mg | ORAL_TABLET | Freq: Every day | ORAL | 3 refills | Status: DC

## 2017-06-26 DIAGNOSIS — R2689 Other abnormalities of gait and mobility: Secondary | ICD-10-CM | POA: Diagnosis not present

## 2017-06-26 DIAGNOSIS — M25571 Pain in right ankle and joints of right foot: Secondary | ICD-10-CM | POA: Diagnosis not present

## 2017-07-03 ENCOUNTER — Other Ambulatory Visit: Payer: Self-pay

## 2017-07-03 DIAGNOSIS — M79671 Pain in right foot: Secondary | ICD-10-CM

## 2017-07-10 DIAGNOSIS — R2689 Other abnormalities of gait and mobility: Secondary | ICD-10-CM | POA: Diagnosis not present

## 2017-07-10 DIAGNOSIS — M25571 Pain in right ankle and joints of right foot: Secondary | ICD-10-CM | POA: Diagnosis not present

## 2017-07-13 DIAGNOSIS — M25571 Pain in right ankle and joints of right foot: Secondary | ICD-10-CM | POA: Diagnosis not present

## 2017-07-13 DIAGNOSIS — R2689 Other abnormalities of gait and mobility: Secondary | ICD-10-CM | POA: Diagnosis not present

## 2017-07-20 DIAGNOSIS — R2689 Other abnormalities of gait and mobility: Secondary | ICD-10-CM | POA: Diagnosis not present

## 2017-07-20 DIAGNOSIS — M25571 Pain in right ankle and joints of right foot: Secondary | ICD-10-CM | POA: Diagnosis not present

## 2017-07-24 DIAGNOSIS — M25571 Pain in right ankle and joints of right foot: Secondary | ICD-10-CM | POA: Diagnosis not present

## 2017-07-24 DIAGNOSIS — R2689 Other abnormalities of gait and mobility: Secondary | ICD-10-CM | POA: Diagnosis not present

## 2017-08-04 ENCOUNTER — Other Ambulatory Visit: Payer: Self-pay | Admitting: Medical

## 2017-08-04 ENCOUNTER — Other Ambulatory Visit: Payer: Self-pay | Admitting: Obstetrics & Gynecology

## 2017-08-27 ENCOUNTER — Ambulatory Visit (INDEPENDENT_AMBULATORY_CARE_PROVIDER_SITE_OTHER): Payer: BLUE CROSS/BLUE SHIELD | Admitting: Medical

## 2017-08-27 ENCOUNTER — Encounter: Payer: Self-pay | Admitting: Medical

## 2017-08-27 VITALS — BP 116/68 | HR 68 | Temp 98.3°F | Wt 179.0 lb

## 2017-08-27 DIAGNOSIS — Z87448 Personal history of other diseases of urinary system: Secondary | ICD-10-CM | POA: Diagnosis not present

## 2017-08-27 DIAGNOSIS — M549 Dorsalgia, unspecified: Secondary | ICD-10-CM | POA: Diagnosis not present

## 2017-08-27 DIAGNOSIS — R509 Fever, unspecified: Secondary | ICD-10-CM | POA: Diagnosis not present

## 2017-08-27 DIAGNOSIS — R6889 Other general symptoms and signs: Secondary | ICD-10-CM | POA: Diagnosis not present

## 2017-08-27 LAB — CBC WITH DIFFERENTIAL/PLATELET
BASOS ABS: 34 {cells}/uL (ref 0–200)
BASOS PCT: 0.3 %
EOS ABS: 91 {cells}/uL (ref 15–500)
EOS PCT: 0.8 %
HCT: 38 % (ref 35.0–45.0)
Hemoglobin: 13.3 g/dL (ref 11.7–15.5)
Lymphs Abs: 2052 cells/uL (ref 850–3900)
MCH: 27.6 pg (ref 27.0–33.0)
MCHC: 35 g/dL (ref 32.0–36.0)
MCV: 78.8 fL — ABNORMAL LOW (ref 80.0–100.0)
MONOS PCT: 6.9 %
MPV: 11.6 fL (ref 7.5–12.5)
NEUTROS ABS: 8436 {cells}/uL — AB (ref 1500–7800)
Neutrophils Relative %: 74 %
PLATELETS: 247 10*3/uL (ref 140–400)
RBC: 4.82 10*6/uL (ref 3.80–5.10)
RDW: 13.3 % (ref 11.0–15.0)
TOTAL LYMPHOCYTE: 18 %
WBC mixed population: 787 cells/uL (ref 200–950)
WBC: 11.4 10*3/uL — ABNORMAL HIGH (ref 3.8–10.8)

## 2017-08-27 LAB — POC INFLUENZA A&B (BINAX/QUICKVUE)
INFLUENZA A, POC: NEGATIVE
INFLUENZA B, POC: NEGATIVE

## 2017-08-27 LAB — POCT URINALYSIS DIP (PROADVANTAGE DEVICE)
BILIRUBIN UA: NEGATIVE mg/dL
Bilirubin, UA: NEGATIVE
Glucose, UA: NEGATIVE mg/dL
Leukocytes, UA: NEGATIVE
Nitrite, UA: NEGATIVE
PH UA: 6 (ref 5.0–8.0)
SPECIFIC GRAVITY, URINE: 1.015
UUROB: NEGATIVE

## 2017-08-27 LAB — POCT UA - MICROSCOPIC ONLY

## 2017-08-27 MED ORDER — CIPROFLOXACIN HCL 500 MG PO TABS: 500.0000 mg | ORAL_TABLET | Freq: Two times a day (BID) | ORAL | 0 refills | Status: DC

## 2017-08-27 NOTE — Progress Notes (Signed)
Subjective: Chief Complaint  Patient presents with  . weakness , chills and fever , having back    x back pain , chills, fever, bobyaches x 2 days    Here for not feeling well.  She reports 2-1/2-day history of generalized weakness, body aches, chills, scratchy throat, has felt feverish with temp up to 100.1, headache. she denies cough, congestion, nausea or vomiting, back pain or abdominal pain.  She has had one sick contact with cold symptoms.  She denies any urinary symptoms even though prior history is significant for pyelonephritis sometimes with minimal symptoms.  She was hospitalized in September 2017 for pyelonephritis.  She has no other symptoms. No other aggravating or relieving factors. No other complaint.  Past Medical History:  Diagnosis Date  . Abnormal pap    lSIL in the past  . Anemia, iron deficiency    in past, not as of 05/2016  . Anxiety   . Asthma    childhood  . Complication of anesthesia    States "when I had kidney stones removed(Open); I had a build up of fluid in my lungs. But I dont really remember much about it otherwise".  . Herpes 04/05/2016  . History of pyelonephritis   . History of recurrent UTIs   . Human papilloma virus   . Hypertension   . Kidney stones   . Menorrhagia   . Nephrolithiasis   . Overweight(278.02)   . Proteinuria   . Renal stone   . Uterine fibroid   . Wears contact lenses    Current Outpatient Medications on File Prior to Visit  Medication Sig Dispense Refill  . acetaminophen (TYLENOL) 325 MG tablet Take 650 mg by mouth every 6 (six) hours as needed for fever (pain).    Marland Kitchen amLODipine (NORVASC) 10 MG tablet Take 1 tablet (10 mg total) by mouth daily. 90 tablet 3  . ibuprofen (ADVIL,MOTRIN) 200 MG tablet Take 600 mg by mouth every 6 (six) hours as needed for fever (pain).    . metroNIDAZOLE (METROGEL VAGINAL) 0.75 % vaginal gel Place 1 Applicatorful vaginally 2 (two) times daily. 70 g 0  . omeprazole (PRILOSEC) 40 MG capsule TAKE 1  CAPSULE (40 MG TOTAL) BY MOUTH DAILY. 90 capsule 0  . TRI-PREVIFEM 0.18/0.215/0.25 MG-35 MCG tablet TAKE 1 TABLET BY MOUTH DAILY 28 tablet 12  . Vitamin D, Ergocalciferol, (DRISDOL) 50000 units CAPS capsule TAKE 1 CAPSULE (50,000 UNITS TOTAL) BY MOUTH EVERY 7 (SEVEN) DAYS. 12 capsule 0   No current facility-administered medications on file prior to visit.    ROS as in subjective   Objective: BP 116/68   Pulse 68   Temp 98.3 F (36.8 C)   Wt 179 lb (81.2 kg)   SpO2 99%   BMI 29.79 kg/m   General appearance: alert, no distress, WD/WN, somewhat ill appearing HEENT: normocephalic, sclerae anicteric, TMs pearly, nares patent, no discharge or erythema, pharynx normal Oral cavity: MMM, no lesions Neck: supple, no lymphadenopathy, no thyromegaly, no masses Heart: RRR, normal S1, S2, no murmurs Lungs: CTA bilaterally, no wheezes, rhonchi, or rales Abdomen: +bs, soft, non tender, non distended, no masses, no hepatomegaly, no splenomegaly Back nontender Pulses: 2+ symmetric, upper and lower extremities, normal cap refill Ext: no edema General muscle tenderness noted    Assessment: Encounter Diagnoses  Name Primary?  . Flu-like symptoms Yes  . Acute back pain, unspecified back location, unspecified back pain laterality   . Fever and chills   . History of pyelonephritis  Plan: Her symptoms suggest acute illness, possibly flu like symptoms possibly early pyelonephritis.  Her flu test is negative, her urine results are somewhat abnormal  With some microscopic red blood cells without other findings.  We will send for culture, stat CBC today.  Advised rest, good hydration, Tylenol for pain over-the-counter.  Assuming possible flu like illness discussed supportive care, prevention of spread of disease.    I prescribed Cipro given her history of prior multiple hospitalization for pyelonephritis, in the event she begins to have more urinary symptoms as discussed.  We will call back  with results.   Answered her questions and she voices agreement with plan  Advised to follow-up with specialist due to kidney abnormality seen in the 9/17 hospitalization.  She never followed up with them after hospitalization as recommended  Angel Morris was seen today for weakness , chills and fever , having back.  Diagnoses and all orders for this visit:  Flu-like symptoms -     POC Influenza A&B(BINAX/QUICKVUE) -     CBC with Differential/Platelet -     Urine Culture  Acute back pain, unspecified back location, unspecified back pain laterality -     CBC with Differential/Platelet -     Urine Culture -     POCT Urinalysis DIP (Proadvantage Device) -     POCT UA - Microscopic Only  Fever and chills -     CBC with Differential/Platelet -     Urine Culture  History of pyelonephritis  Other orders -     ciprofloxacin (CIPRO) 500 MG tablet; Take 1 tablet (500 mg total) by mouth 2 (two) times daily.

## 2017-08-28 LAB — URINE CULTURE
MICRO NUMBER:: 90000122
SPECIMEN QUALITY:: ADEQUATE

## 2017-08-29 ENCOUNTER — Telehealth: Payer: Self-pay | Admitting: Medical

## 2017-08-29 ENCOUNTER — Other Ambulatory Visit: Payer: Self-pay | Admitting: Medical

## 2017-08-29 MED ORDER — AMOXICILLIN 500 MG PO CAPS: 500.0000 mg | ORAL_CAPSULE | Freq: Three times a day (TID) | ORAL | 0 refills | Status: DC

## 2017-08-29 NOTE — Telephone Encounter (Signed)
I called patient back per sensitivity results .  She will do 5 days of Cipro, but start Amoxicillin along with cipro today.

## 2017-10-06 ENCOUNTER — Other Ambulatory Visit: Payer: Self-pay | Admitting: Medical

## 2017-10-06 DIAGNOSIS — R12 Heartburn: Secondary | ICD-10-CM

## 2017-10-16 ENCOUNTER — Telehealth: Payer: Self-pay | Admitting: Family Medicine

## 2017-10-16 NOTE — Telephone Encounter (Signed)
Please call pt regarding prior auth on her Omeprazole.  Pt ph [phone]

## 2017-10-24 ENCOUNTER — Telehealth: Payer: Self-pay | Admitting: Medical

## 2017-10-24 NOTE — Telephone Encounter (Signed)
Initiated P.A. Omeprazole

## 2017-10-25 ENCOUNTER — Telehealth: Payer: Self-pay | Admitting: Medical

## 2017-10-25 NOTE — Telephone Encounter (Signed)
Pt called and states that she would like a note, stating that she can wear regular shoes to work, states she has seen your for her foot pain before, her work requires her to wear steel toe shoes but they hurt her feet way to much, p can be reached at [phone]

## 2017-10-26 ENCOUNTER — Encounter: Payer: Self-pay | Admitting: Medical

## 2017-10-26 NOTE — Telephone Encounter (Signed)
1)if still having a lot of problems I recommend follow up with podiatry if she continues to have pain regularly 2) fax/mail the letter I did

## 2017-10-26 NOTE — Telephone Encounter (Signed)
Informed pt that letter was ready and about the foot dr

## 2017-10-27 NOTE — Telephone Encounter (Signed)
P.A. Denied insurance does not cover any OTC meds, Called pharmacy and out of pocket cost for 90 days $130 but can but OTC 20mg  #42 $17.99.  Called pt and informed of options.  She will buy OTC and take 2

## 2017-11-02 NOTE — Telephone Encounter (Signed)
See other telephone call

## 2018-01-07 ENCOUNTER — Other Ambulatory Visit: Payer: Self-pay | Admitting: *Deleted

## 2018-01-07 ENCOUNTER — Telehealth: Payer: Self-pay

## 2018-01-07 NOTE — Telephone Encounter (Signed)
Received fax from [facility] for 90 day supply of Amlodipine Besylate tabs 10 mg to be sent in.  Is this ok?

## 2018-01-08 MED ORDER — NORGESTIM-ETH ESTRAD TRIPHASIC 0.18/0.215/0.25 MG-35 MCG PO TABS: 1.0000 | ORAL_TABLET | Freq: Every day | ORAL | 4 refills | Status: DC

## 2018-01-10 NOTE — Telephone Encounter (Signed)
Is this appropriate?  

## 2018-01-10 NOTE — Telephone Encounter (Signed)
Yes, please refill

## 2018-01-11 ENCOUNTER — Other Ambulatory Visit: Payer: Self-pay

## 2018-01-11 DIAGNOSIS — I10 Essential (primary) hypertension: Secondary | ICD-10-CM

## 2018-01-11 MED ORDER — AMLODIPINE BESYLATE 10 MG PO TABS: 10.0000 mg | ORAL_TABLET | Freq: Every day | ORAL | 3 refills | Status: DC

## 2018-01-11 NOTE — Telephone Encounter (Signed)
Medication sent.

## 2018-02-01 ENCOUNTER — Other Ambulatory Visit: Payer: BLUE CROSS/BLUE SHIELD | Admitting: Adult Health

## 2018-02-13 ENCOUNTER — Ambulatory Visit (INDEPENDENT_AMBULATORY_CARE_PROVIDER_SITE_OTHER): Payer: BLUE CROSS/BLUE SHIELD | Admitting: Medical

## 2018-02-13 VITALS — BP 124/76 | HR 68 | Resp 16 | Ht 65.0 in | Wt 176.0 lb

## 2018-02-13 DIAGNOSIS — M2142 Flat foot [pes planus] (acquired), left foot: Secondary | ICD-10-CM

## 2018-02-13 DIAGNOSIS — M25562 Pain in left knee: Secondary | ICD-10-CM | POA: Diagnosis not present

## 2018-02-13 DIAGNOSIS — M25561 Pain in right knee: Secondary | ICD-10-CM | POA: Diagnosis not present

## 2018-02-13 DIAGNOSIS — M238X9 Other internal derangements of unspecified knee: Secondary | ICD-10-CM

## 2018-02-13 DIAGNOSIS — M2141 Flat foot [pes planus] (acquired), right foot: Secondary | ICD-10-CM

## 2018-02-13 NOTE — Progress Notes (Signed)
Subjective: Chief Complaint  Patient presents with  . bilateral knee weakness L > R    bilateral knee pain, weakness, out of place X 2 month   Here for knee issues.   Been having some knee pains the last 2 months.   Knees sound like paper bag chips when squatting.   She had been dealing with ankle issues, going to PT regularly, got that straightened out.  just recently in last 3 weeks started walking an hour daily for exercise.  Now having pain in both knees, left worse than right.   Lately knees feel weak, worse on left.   Feels like knees want to give out.   This past week turned and felt knee kind of go out of place.  After that it felt weird.  Since then knee feels like it could give out at any moment.  Thinks the left knee has been mildly swollen.  Been using knee brace.  No other aggravating or relieving factors. No other complaint.  Past Medical History:  Diagnosis Date  . Abnormal pap    lSIL in the past  . Anemia, iron deficiency    in past, not as of 05/2016  . Anxiety   . Asthma    childhood  . Complication of anesthesia    States "when I had kidney stones removed(Open); I had a build up of fluid in my lungs. But I dont really remember much about it otherwise".  . Herpes 04/05/2016  . History of pyelonephritis   . History of recurrent UTIs   . Human papilloma virus   . Hypertension   . Kidney stones   . Menorrhagia   . Nephrolithiasis   . Overweight(278.02)   . Proteinuria   . Renal stone   . Uterine fibroid   . Wears contact lenses    Current Outpatient Medications on File Prior to Visit  Medication Sig Dispense Refill  . amLODipine (NORVASC) 10 MG tablet Take 1 tablet (10 mg total) by mouth daily. 90 tablet 3  . Norgestimate-Ethinyl Estradiol Triphasic (TRI-PREVIFEM) 0.18/0.215/0.25 MG-35 MCG tablet Take 1 tablet by mouth daily. 3 Package 4  . ibuprofen (ADVIL,MOTRIN) 200 MG tablet Take 600 mg by mouth every 6 (six) hours as needed for fever (pain).    Marland Kitchen omeprazole  (PRILOSEC) 40 MG capsule TAKE 1 CAPSULE BY MOUTH EVERY DAY (Patient not taking: Reported on 02/13/2018) 90 capsule 0   No current facility-administered medications on file prior to visit.    ROS as in subjective    Objective: BP 124/76   Pulse 68   Resp 16   Ht 5\' 5"  (1.651 m)   Wt 176 lb (79.8 kg)   SpO2 98%   BMI 29.29 kg/m   Wt Readings from Last 3 Encounters:  02/13/18 176 lb (79.8 kg)  08/27/17 179 lb (81.2 kg)  06/15/17 174 lb 6.4 oz (79.1 kg)   Gen: wd, wn, nad Skin: unremarkable, no erythema, no bruising MSK:  Knees nontender, bilat valgus stress reveals laxity but it is symmetrical bilat, there is slight in towing of knees and bilat pes planus.  Some crepitus heard with flexion.   otherwise no obvious swelling or deformity, no bony changes, normal ROM of knees.  Rest of legs with normal ROM without pain or laxity or swelling Legs neurovascularly intact Ext: no edema    Assessment: Encounter Diagnoses  Name Primary?  . Pain in both knees, unspecified chronicity Yes  . Pes planus of both feet   .  Lax MCL        Plan: discussed findings.   Advised good arch supports, not going barefooted   Advised 1 week of relative rest, elevation of legs and ice pack with bag of frozen peas both knees in the evenings.   After a week of relative rest, discussed specific home rehab and stretching including single and bilat light squats and leg presses, but not beyond 90 degrees, no lunges, and gradually return to full activity.  F/u 2 wk

## 2018-02-18 ENCOUNTER — Other Ambulatory Visit (HOSPITAL_COMMUNITY)
Admission: RE | Admit: 2018-02-18 | Discharge: 2018-02-18 | Disposition: A | Discharge status: Home / Self Care | Payer: BLUE CROSS/BLUE SHIELD | Source: Ambulatory Visit | Attending: Adult Health | Admitting: Adult Health

## 2018-02-18 ENCOUNTER — Encounter: Payer: Self-pay | Admitting: Adult Health

## 2018-02-18 ENCOUNTER — Ambulatory Visit (INDEPENDENT_AMBULATORY_CARE_PROVIDER_SITE_OTHER): Payer: BLUE CROSS/BLUE SHIELD | Admitting: Adult Health

## 2018-02-18 VITALS — BP 134/77 | HR 80 | Ht 65.0 in | Wt 177.5 lb

## 2018-02-18 DIAGNOSIS — Z3009 Encounter for other general counseling and advice on contraception: Secondary | ICD-10-CM | POA: Diagnosis not present

## 2018-02-18 DIAGNOSIS — Z113 Encounter for screening for infections with a predominantly sexual mode of transmission: Secondary | ICD-10-CM | POA: Diagnosis not present

## 2018-02-18 DIAGNOSIS — Z01419 Encounter for gynecological examination (general) (routine) without abnormal findings: Secondary | ICD-10-CM

## 2018-02-18 DIAGNOSIS — F329 Major depressive disorder, single episode, unspecified: Secondary | ICD-10-CM | POA: Diagnosis not present

## 2018-02-18 DIAGNOSIS — F32A Depression, unspecified: Secondary | ICD-10-CM

## 2018-02-18 DIAGNOSIS — Z1211 Encounter for screening for malignant neoplasm of colon: Secondary | ICD-10-CM | POA: Diagnosis not present

## 2018-02-18 DIAGNOSIS — Z1212 Encounter for screening for malignant neoplasm of rectum: Secondary | ICD-10-CM | POA: Diagnosis not present

## 2018-02-18 DIAGNOSIS — Z3041 Encounter for surveillance of contraceptive pills: Secondary | ICD-10-CM

## 2018-02-18 LAB — HEMOCCULT GUIAC POC 1CARD (OFFICE): FECAL OCCULT BLD: NEGATIVE

## 2018-02-18 MED ORDER — NORGESTIM-ETH ESTRAD TRIPHASIC 0.18/0.215/0.25 MG-35 MCG PO TABS: 1.0000 | ORAL_TABLET | Freq: Every day | ORAL | 4 refills | Status: DC

## 2018-02-18 MED ORDER — ESCITALOPRAM OXALATE 10 MG PO TABS: 10.0000 mg | ORAL_TABLET | Freq: Every day | ORAL | 6 refills | Status: DC

## 2018-02-18 NOTE — Patient Instructions (Signed)
Major Depressive Disorder, Adult Major depressive disorder (MDD) is a mental health condition. It may also be called clinical depression or unipolar depression. MDD usually causes feelings of sadness, hopelessness, or helplessness. MDD can also cause physical symptoms. It can interfere with work, school, relationships, and other everyday activities. MDD may be mild, moderate, or severe. It may occur once (single episode major depressive disorder) or it may occur multiple times (recurrent major depressive disorder). What are the causes? The exact cause of this condition is not known. MDD is most likely caused by a combination of things, which may include:  Genetic factors. These are traits that are passed along from parent to child.  Individual factors. Your personality, your behavior, and the way you handle your thoughts and feelings may contribute to MDD. This includes personality traits and behaviors learned from others.  Physical factors, such as: ? Differences in the part of your brain that controls emotion. This part of your brain may be different than it is in people who do not have MDD. ? Long-term (chronic) medical or psychiatric illnesses.  Social factors. Traumatic experiences or major life changes may play a role in the development of MDD.  What increases the risk? This condition is more likely to develop in women. The following factors may also make you more likely to develop MDD:  A family history of depression.  Troubled family relationships.  Abnormally low levels of certain brain chemicals.  Traumatic events in childhood, especially abuse or the loss of a parent.  Being under a lot of stress, or long-term stress, especially from upsetting life experiences or losses.  A history of: ? Chronic physical illness. ? Other mental health disorders. ? Substance abuse.  Poor living conditions.  Experiencing social exclusion or discrimination on a regular basis.  What are  the signs or symptoms? The main symptoms of MDD typically include:  Constant depressed or irritable mood.  Loss of interest in things and activities.  MDD symptoms may also include:  Sleeping or eating too much or too little.  Unexplained weight change.  Fatigue or low energy.  Feelings of worthlessness or guilt.  Difficulty thinking clearly or making decisions.  Thoughts of suicide or of harming others.  Physical agitation or weakness.  Isolation.  Severe cases of MDD may also occur with other symptoms, such as:  Delusions or hallucinations, in which you imagine things that are not real (psychotic depression).  Low-level depression that lasts at least a year (chronic depression or persistent depressive disorder).  Extreme sadness and hopelessness (melancholic depression).  Trouble speaking and moving (catatonic depression).  How is this diagnosed? This condition may be diagnosed based on:  Your symptoms.  Your medical history, including your mental health history. This may involve tests to evaluate your mental health. You may be asked questions about your lifestyle, including any drug and alcohol use, and how long you have had symptoms of MDD.  A physical exam.  Blood tests to rule out other conditions.  You must have a depressed mood and at least four other MDD symptoms most of the day, nearly every day in the same 2-week timeframe before your health care provider can confirm a diagnosis of MDD. How is this treated? This condition is usually treated by mental health professionals, such as psychologists, psychiatrists, and clinical social workers. You may need more than one type of treatment. Treatment may include:  Psychotherapy. This is also called talk therapy or counseling. Types of psychotherapy include: ? Cognitive behavioral   therapy (CBT). This type of therapy teaches you to recognize unhealthy feelings, thoughts, and behaviors, and replace them with  positive thoughts and actions. ? Interpersonal therapy (IPT). This helps you to improve the way you relate to and communicate with others. ? Family therapy. This treatment includes members of your family.  Medicine to treat anxiety and depression, or to help you control certain emotions and behaviors.  Lifestyle changes, such as: ? Limiting alcohol and drug use. ? Exercising regularly. ? Getting plenty of sleep. ? Making healthy eating choices. ? Spending more time outdoors.  Treatments involving stimulation of the brain can be used in situations with extremely severe symptoms, or when medicine or other therapies do not work over time. These treatments include electroconvulsive therapy, transcranial magnetic stimulation, and vagal nerve stimulation. Follow these instructions at home: Activity  Return to your normal activities as told by your health care provider.  Exercise regularly and spend time outdoors as told by your health care provider. General instructions  Take over-the-counter and prescription medicines only as told by your health care provider.  Do not drink alcohol. If you drink alcohol, limit your alcohol intake to no more than 1 drink a day for nonpregnant women and 2 drinks a day for men. One drink equals 12 oz of beer, 5 oz of wine, or 1 oz of hard liquor. Alcohol can affect any antidepressant medicines you are taking. Talk to your health care provider about your alcohol use.  Eat a healthy diet and get plenty of sleep.  Find activities that you enjoy doing, and make time to do them.  Consider joining a support group. Your health care provider may be able to recommend a support group.  Keep all follow-up visits as told by your health care provider. This is important. Where to find more information: National Alliance on Mental Illness  www.nami.org  U.S. National Institute of Mental Health  www.nimh.nih.gov  National Suicide Prevention  Lifeline  1-800-273-TALK (8255). This is free, 24-hour help.  Contact a health care provider if:  Your symptoms get worse.  You develop new symptoms. Get help right away if:  You self-harm.  You have serious thoughts about hurting yourself or others.  You see, hear, taste, smell, or feel things that are not present (hallucinate). This information is not intended to replace advice given to you by your health care provider. Make sure you discuss any questions you have with your health care provider. Document Released: 12/09/2012 Document Revised: 04/20/2016 Document Reviewed: 02/23/2016 Elsevier Interactive Patient Education  2018 Elsevier Inc.  

## 2018-02-18 NOTE — Progress Notes (Signed)
Patient ID: Angel Morris, female   DOB: 08-27-1976, 42 y.o.   MRN: [id number] History of Present Illness: [place] is a 42 year old black female in for a well woman gyn exam and pap. PCP is Dr Glade Lloyd in [place].    Current Medications, Allergies, Past Medical History, Past Surgical History, Family History and Social History were reviewed in [employer] record.     Review of Systems:  Patient denies any headaches, hearing loss,  blurred vision, shortness of breath, chest pain, abdominal pain, problems with bowel movements, urination, or intercourse. No  mood swings.+musky vaginal odor at times and seems to be sweating more.  +fatigue, +pain in knees(sees PCP), +depressed  Physical Exam:BP 134/77 (BP Location: Left Arm, Patient Position: Sitting, Cuff Size: Normal)   Pulse 80   Ht 5\' 5"  (1.651 m)   Wt 177 lb 8 oz (80.5 kg)   BMI 29.54 kg/m  General:  Well developed, well nourished, no acute distress Skin:  Warm and dry Neck:  Midline trachea, normal thyroid, good ROM, no lymphadenopathy Lungs; Clear to auscultation bilaterally Breast:  No dominant palpable mass, retraction, or nipple discharge Cardiovascular: Regular rate and rhythm Abdomen:  Soft, non tender, no hepatosplenomegaly Pelvic:  External genitalia is normal in appearance, no lesions.  The vagina is normal in appearance, white discharge with no odor. Urethra has no lesions or masses. The cervix is bulbous. Pap with HPV and GC/CHL performed. Uterus is felt to be normal size, shape, and contour.  No adnexal masses or tenderness noted.Bladder is non tender, no masses felt. Rectal: Good sphincter tone, no polyps, or hemorrhoids felt.  Hemoccult negative. Extremities/musculoskeletal:  No swelling or varicosities noted, no clubbing or cyanosis Psych:  No mood changes, alert and cooperative,seems happy PHQ 9 score 6, denies being suicidal and is open to meds, will try lexapro.  Impression: 1. Encounter  for gynecological examination with Papanicolaou smear of cervix   2. Family planning   3. Screening for colorectal cancer   4. Encounter for surveillance of contraceptive pills   5. Screening examination for STD (sexually transmitted disease)   6. Depression, unspecified depression type       Plan: Check HIV and RPR Meds ordered this encounter  Medications  . escitalopram (LEXAPRO) 10 MG tablet    Sig: Take 1 tablet (10 mg total) by mouth daily.    Dispense:  30 tablet    Refill:  6    Order Specific Question:   Supervising Provider    Answer:   Elonda Husky, LUTHER H [[id number]]  . Norgestimate-Ethinyl Estradiol Triphasic (TRI-PREVIFEM) 0.18/0.215/0.25 MG-35 MCG tablet    Sig: Take 1 tablet by mouth daily.    Dispense:  3 Package    Refill:  4    Order Specific Question:   Supervising Provider    Answer:   Tania Ade H [[id number]]  F/U in 6 weeks Physical in 1 year Pap in 3 if normal Get mammogram now Labs with PCP Review handout on depression

## 2018-02-19 LAB — RPR: RPR Ser Ql: NONREACTIVE

## 2018-02-19 LAB — HIV ANTIBODY (ROUTINE TESTING W REFLEX): HIV SCREEN 4TH GENERATION: NONREACTIVE

## 2018-02-21 LAB — CYTOLOGY - PAP
Adequacy: ABSENT
CHLAMYDIA, DNA PROBE: NEGATIVE
Diagnosis: NEGATIVE
HPV: NOT DETECTED
Neisseria Gonorrhea: NEGATIVE

## 2018-02-22 ENCOUNTER — Telehealth: Payer: Self-pay | Admitting: Adult Health

## 2018-02-22 MED ORDER — METRONIDAZOLE 500 MG PO TABS
500.0000 mg | ORAL_TABLET | Freq: Two times a day (BID) | ORAL | 0 refills | Status: DC
Start: 2018-02-22 — End: 2018-06-05

## 2018-02-22 NOTE — Telephone Encounter (Signed)
Left message that pap is negative for malignancy,HPV,GC/CHL but +BV, sent rx for flagyl to [facility],no alcohol or sex white taking meds,

## 2018-04-01 ENCOUNTER — Ambulatory Visit: Payer: BLUE CROSS/BLUE SHIELD | Admitting: Adult Health

## 2018-06-05 ENCOUNTER — Telehealth: Payer: Self-pay | Admitting: Adult Health

## 2018-06-05 MED ORDER — METRONIDAZOLE 500 MG PO TABS: 500.0000 mg | ORAL_TABLET | Freq: Two times a day (BID) | ORAL | 0 refills | Status: DC

## 2018-06-05 NOTE — Telephone Encounter (Signed)
refill flagyl

## 2018-06-05 NOTE — Telephone Encounter (Signed)
PT gets the [place] and Jenn will call in meds for her without her being seen and wants to know if she can call in for her. [facility] in [facility] file/amp

## 2018-06-17 ENCOUNTER — Ambulatory Visit (INDEPENDENT_AMBULATORY_CARE_PROVIDER_SITE_OTHER): Payer: BLUE CROSS/BLUE SHIELD | Admitting: Medical

## 2018-06-17 ENCOUNTER — Encounter: Payer: Self-pay | Admitting: Medical

## 2018-06-17 VITALS — BP 130/86 | HR 61 | Temp 98.4°F | Ht 65.0 in | Wt 173.0 lb

## 2018-06-17 DIAGNOSIS — D72829 Elevated white blood cell count, unspecified: Secondary | ICD-10-CM | POA: Diagnosis not present

## 2018-06-17 DIAGNOSIS — E559 Vitamin D deficiency, unspecified: Secondary | ICD-10-CM

## 2018-06-17 DIAGNOSIS — J4520 Mild intermittent asthma, uncomplicated: Secondary | ICD-10-CM | POA: Insufficient documentation

## 2018-06-17 DIAGNOSIS — I10 Essential (primary) hypertension: Secondary | ICD-10-CM | POA: Diagnosis not present

## 2018-06-17 DIAGNOSIS — Z87448 Personal history of other diseases of urinary system: Secondary | ICD-10-CM

## 2018-06-17 DIAGNOSIS — R609 Edema, unspecified: Secondary | ICD-10-CM

## 2018-06-17 DIAGNOSIS — Z87442 Personal history of urinary calculi: Secondary | ICD-10-CM

## 2018-06-17 DIAGNOSIS — Z0000 Encounter for general adult medical examination without abnormal findings: Secondary | ICD-10-CM

## 2018-06-17 DIAGNOSIS — R5383 Other fatigue: Secondary | ICD-10-CM

## 2018-06-17 LAB — POCT URINALYSIS DIP (PROADVANTAGE DEVICE)
Bilirubin, UA: NEGATIVE
GLUCOSE UA: NEGATIVE mg/dL
Ketones, POC UA: NEGATIVE mg/dL
Leukocytes, UA: NEGATIVE
NITRITE UA: NEGATIVE
PH UA: 6 (ref 5.0–8.0)
RBC UA: NEGATIVE
Specific Gravity, Urine: 1.02

## 2018-06-17 MED ORDER — ALBUTEROL SULFATE HFA 108 (90 BASE) MCG/ACT IN AERS: 2.0000 | INHALATION_SPRAY | Freq: Four times a day (QID) | RESPIRATORY_TRACT | 0 refills | Status: DC | PRN

## 2018-06-17 NOTE — Progress Notes (Signed)
Subjective:   HPI  Angel Morris is a 42 y.o. female who presents for a complete physical.  Medical team: [facility] for dentist Eye doctor Glade Lloyd, Camelia Eng, PA-C here for primary care  Gets flu shot free at work last month  Concerns: Some LE edema occasionally if seated or standing prolonged   No recent problems with asthma  Needs biometric form completed  Hx/o pyelo, had recent urinary discomfort, low back pain but 90% improved.  Reviewed their medical, surgical, family, social, medication, and allergy history and updated chart as appropriate.  Past Medical History:  Diagnosis Date  . Abnormal pap    lSIL in the past  . Anemia, iron deficiency    in past, not as of 05/2016  . Anxiety   . Asthma    childhood  . Complication of anesthesia    States "when I had kidney stones removed(Open); I had a build up of fluid in my lungs. But I dont really remember much about it otherwise".  . Herpes 04/05/2016  . History of pyelonephritis   . History of recurrent UTIs   . Human papilloma virus   . Hypertension   . Kidney stones   . Menorrhagia   . Nephrolithiasis   . Overweight(278.02)   . Proteinuria   . Renal stone   . Uterine fibroid   . Vaginal Pap smear, abnormal   . Wears contact lenses     Past Surgical History:  Procedure Laterality Date  . CERVICAL CONIZATION W/BX N/A 10/08/2013   Procedure: CONIZATION CERVIX WITH BIOPSY;  Surgeon: Florian Buff, MD;  Location: [facility];  Service: Gynecology;  Laterality: N/A;  . DILATION AND CURETTAGE OF UTERUS    . DILITATION & CURRETTAGE/HYSTROSCOPY WITH NOVASURE ABLATION N/A 07/02/2015   Procedure: DILATATION & CURETTAGE/HYSTEROSCOPY WITH NOVASURE ABLATION;  Surgeon: Florian Buff, MD;  Location: [facility];  Service: Gynecology;  Laterality: N/A;  Uterine Cavity Length 5.5 Uterine Cavity Width 3.5 Power 106 Time 1 minute 4 seconds  . HOLMIUM LASER APPLICATION N/A 1/82/9937   Procedure: HOLMIUM LASER  APPLICATION;  Surgeon: Florian Buff, MD;  Location: [facility];  Service: Gynecology;  Laterality: N/A;  . kidney stones  june 2000   open removal  . rotary cuff - left shoulder  2005    Social History   Socioeconomic History  . Marital status: Divorced    Spouse name: Not on file  . Number of children: Not on file  . Years of education: Not on file  . Highest education level: Not on file  Occupational History  . Not on file  Social Needs  . Financial resource strain: Not on file  . Food insecurity:    Worry: Not on file    Inability: Not on file  . Transportation needs:    Medical: Not on file    Non-medical: Not on file  Tobacco Use  . Smoking status: Never Smoker  . Smokeless tobacco: Never Used  Substance and Sexual Activity  . Alcohol use: Yes    Comment: rare  . Drug use: No  . Sexual activity: Yes    Birth control/protection: Pill, Surgical    Comment: ablation  Lifestyle  . Physical activity:    Days per week: Not on file    Minutes per session: Not on file  . Stress: Not on file  Relationships  . Social connections:    Talks on phone: Not on file    Gets  together: Not on file    Attends religious service: Not on file    Active member of club or organization: Not on file    Attends meetings of clubs or organizations: Not on file    Relationship status: Not on file  . Intimate partner violence:    Fear of current or ex partner: Not on file    Emotionally abused: Not on file    Physically abused: Not on file    Forced sexual activity: Not on file  Other Topics Concern  . Not on file  Social History Narrative   Lives with 2 of her children, has 4 children.   Works at [employer], works in Academic librarian.  Exercise - just walking on the job.   As of 05/2018    Family History  Problem Relation Age of Onset  . Fibromyalgia Mother   . Arthritis Mother        RA  . Hypertension Mother   . Diabetes Mother   . Diabetes Brother   . Hypertension  Brother   . Diabetes Father        diabetes, amputation  . Heart disease Father 69       died of MI 02-06-2017  . Kidney disease Father        dialysis  . Stroke Maternal Grandmother   . Aneurysm Maternal Grandfather   . Stroke Maternal Grandfather   . Cancer Neg Hx      Current Outpatient Medications:  .  amLODipine (NORVASC) 10 MG tablet, Take 1 tablet (10 mg total) by mouth daily., Disp: 90 tablet, Rfl: 3 .  metroNIDAZOLE (FLAGYL) 500 MG tablet, Take 1 tablet (500 mg total) by mouth 2 (two) times daily., Disp: 14 tablet, Rfl: 0 .  Norgestimate-Ethinyl Estradiol Triphasic (TRI-PREVIFEM) 0.18/0.215/0.25 MG-35 MCG tablet, Take 1 tablet by mouth daily., Disp: 3 Package, Rfl: 4 .  escitalopram (LEXAPRO) 10 MG tablet, Take 1 tablet (10 mg total) by mouth daily. (Patient not taking: Reported on 06/17/2018), Disp: 30 tablet, Rfl: 6  No Known Allergies   Review of Systems Constitutional: -fever, -chills, -sweats, -unexpected weight change, -decreased appetite, +fatigue Allergy: -sneezing, -itching, -congestion Dermatology: -changing moles, --rash, -lumps ENT: -runny nose, -ear pain, -sore throat, -hoarseness, -sinus pain, -teeth pain, - ringing in ears, -hearing loss, -nosebleeds Cardiology: -chest pain, -palpitations, +swelling, -difficulty breathing when lying flat, -waking up short of breath Respiratory: -cough, -shortness of breath, -difficulty breathing with exercise or exertion, -wheezing, -coughing up blood Gastroenterology: -abdominal pain, -nausea, -vomiting, -diarrhea, -constipation, -blood in stool, -changes in bowel movement, -difficulty swallowing or eating Hematology: -bleeding, -bruising  Musculoskeletal: -joint aches, -muscle aches, -joint swelling, -back pain, -neck pain, -cramping, -changes in gait Ophthalmology: denies vision changes, eye redness, itching, discharge Urology: -burning with urination, -difficulty urinating, -blood in urine, -urinary frequency, -urgency,  -incontinence Neurology: -headache, -weakness, -tingling, -numbness, -memory loss, -falls, -dizziness Psychology: -depressed mood, -agitation, -sleep problems, -SI/HI     Objective:   Physical Exam  BP 130/86   Pulse 61   Temp 98.4 F (36.9 C) (Oral)   Ht 5\' 5"  (1.651 m)   Wt 173 lb (78.5 kg)   SpO2 96%   BMI 28.79 kg/m   Wt Readings from Last 3 Encounters:  06/17/18 173 lb (78.5 kg)  02/18/18 177 lb 8 oz (80.5 kg)  02/13/18 176 lb (79.8 kg)    General appearance: alert, no distress, WD/WN, AA female Skin: no worrisome findings HEENT: normocephalic, conjunctiva/corneas normal, sclerae anicteric,  PERRLA, EOMi, nares patent, no discharge or erythema, pharynx normal Oral cavity: MMM, tongue normal, teeth in good repair Neck: supple, no lymphadenopathy, no thyromegaly, no masses, normal ROM, no bruits Chest: non tender, normal shape and expansion Heart: RRR, normal S1, S2, no murmurs Lungs: CTA bilaterally, no wheezes, rhonchi, or rales Abdomen: +bs, soft, non tender, non distended, no masses, no hepatomegaly, no splenomegaly, no bruits Back: non tender, normal ROM, no scoliosis Musculoskeletal: tender anterior right ankle, but normal ROM, no deformity other than flat feet, left deltoid surgical scars, otherwise upper extremities non tender, no obvious deformity, normal ROM throughout, lower extremities non tender, no obvious deformity, normal ROM throughout Extremities: no edema, no cyanosis, no clubbing Pulses: 2+ symmetric, upper and lower extremities, normal cap refill Neurological: alert, oriented x 3, CN2-12 intact, strength normal upper extremities and lower extremities, sensation normal throughout, DTRs 2+ throughout, no cerebellar signs, gait normal Psychiatric: normal affect, behavior normal, pleasant  Breast/gyn/rectal - deferred to gyn    Assessment and Plan :    Encounter Diagnoses  Name Primary?  . Encounter for health maintenance examination in adult Yes  .  Essential hypertension   . Vitamin D deficiency   . Leukocytosis, unspecified type   . Other fatigue   . History of renal stone   . History of pyelonephritis   . Dependent edema   . Mild intermittent asthma without complication     Physical exam - discussed healthy lifestyle, diet, exercise, preventative care, vaccinations, and addressed their concerns.   See your eye doctor yearly for routine vision care. See your dentist yearly for routine dental care including hygiene visits twice yearly. See your gynecologist yearly for routine gynecological care. C/t same medications Completed her biometric form for work She had a flu shot at work recently  Edema - advised regular exercise, limit salt intake, begin OTC compression hose.    HTN - at goal. C/t current medication.   fatigue - labs today  Asthma - no issues in past 12 months.   Follow-up pending lab  Tamie was seen today for annual exam.  Diagnoses and all orders for this visit:  Encounter for health maintenance examination in adult -     Hemoglobin A1c -     Lipid panel -     CBC -     Comprehensive metabolic panel -     VITAMIN D 25 Hydroxy (Vit-D Deficiency, Fractures) -     POCT Urinalysis DIP (Proadvantage Device)  Essential hypertension  Vitamin D deficiency -     VITAMIN D 25 Hydroxy (Vit-D Deficiency, Fractures)  Leukocytosis, unspecified type -     CBC  Other fatigue  History of renal stone  History of pyelonephritis  Dependent edema  Mild intermittent asthma without complication

## 2018-06-18 ENCOUNTER — Other Ambulatory Visit: Payer: Self-pay | Admitting: Medical

## 2018-06-18 LAB — LIPID PANEL
Chol/HDL Ratio: 2.2 ratio (ref 0.0–4.4)
Cholesterol, Total: 149 mg/dL (ref 100–199)
HDL: 69 mg/dL (ref >39)
LDL CALC: 66 mg/dL (ref 0–99)
Triglycerides: 71 mg/dL (ref 0–149)
VLDL Cholesterol Cal: 14 mg/dL (ref 5–40)

## 2018-06-18 LAB — CBC
Hematocrit: 36.9 % (ref 34.0–46.6)
Hemoglobin: 13.1 g/dL (ref 11.1–15.9)
MCH: 28.5 pg (ref 26.6–33.0)
MCHC: 35.5 g/dL (ref 31.5–35.7)
MCV: 80 fL (ref 79–97)
PLATELETS: 250 10*3/uL (ref 150–450)
RBC: 4.59 x10E6/uL (ref 3.77–5.28)
RDW: 13 % (ref 12.3–15.4)
WBC: 10.3 10*3/uL (ref 3.4–10.8)

## 2018-06-18 LAB — COMPREHENSIVE METABOLIC PANEL
A/G RATIO: 1.4 (ref 1.2–2.2)
ALT: 8 IU/L (ref 0–32)
AST: 11 IU/L (ref 0–40)
Albumin: 4 g/dL (ref 3.5–5.5)
Alkaline Phosphatase: 60 IU/L (ref 39–117)
BUN/Creatinine Ratio: 19 (ref 9–23)
BUN: 13 mg/dL (ref 6–24)
Bilirubin Total: 0.6 mg/dL (ref 0.0–1.2)
CALCIUM: 9 mg/dL (ref 8.7–10.2)
CO2: 23 mmol/L (ref 20–29)
Chloride: 100 mmol/L (ref 96–106)
Creatinine, Ser: 0.69 mg/dL (ref 0.57–1.00)
GFR, EST AFRICAN AMERICAN: 124 mL/min/{1.73_m2} (ref >59)
GFR, EST NON AFRICAN AMERICAN: 108 mL/min/{1.73_m2} (ref >59)
Globulin, Total: 2.9 g/dL (ref 1.5–4.5)
Glucose: 79 mg/dL (ref 65–99)
Potassium: 3.8 mmol/L (ref 3.5–5.2)
Sodium: 138 mmol/L (ref 134–144)
TOTAL PROTEIN: 6.9 g/dL (ref 6.0–8.5)

## 2018-06-18 LAB — HEMOGLOBIN A1C
ESTIMATED AVERAGE GLUCOSE: 111 mg/dL
HEMOGLOBIN A1C: 5.5 % (ref 4.8–5.6)

## 2018-06-18 LAB — VITAMIN D 25 HYDROXY (VIT D DEFICIENCY, FRACTURES): Vit D, 25-Hydroxy: 24.5 ng/mL — ABNORMAL LOW (ref 30.0–100.0)

## 2018-06-18 MED ORDER — VITAMIN D 1000 UNITS PO TABS: 1000.0000 [IU] | ORAL_TABLET | Freq: Every day | ORAL | 3 refills | Status: DC

## 2018-06-21 ENCOUNTER — Other Ambulatory Visit: Payer: Self-pay

## 2018-06-21 DIAGNOSIS — R809 Proteinuria, unspecified: Secondary | ICD-10-CM

## 2018-06-21 DIAGNOSIS — Z833 Family history of diabetes mellitus: Secondary | ICD-10-CM

## 2018-06-21 DIAGNOSIS — D72829 Elevated white blood cell count, unspecified: Secondary | ICD-10-CM

## 2018-10-21 ENCOUNTER — Ambulatory Visit (INDEPENDENT_AMBULATORY_CARE_PROVIDER_SITE_OTHER): Payer: BLUE CROSS/BLUE SHIELD | Admitting: Medical

## 2018-10-21 ENCOUNTER — Encounter: Payer: Self-pay | Admitting: Medical

## 2018-10-21 VITALS — BP 140/90 | HR 65 | Temp 98.0°F | Resp 16 | Ht 64.5 in | Wt 177.4 lb

## 2018-10-21 DIAGNOSIS — R0602 Shortness of breath: Secondary | ICD-10-CM | POA: Diagnosis not present

## 2018-10-21 DIAGNOSIS — Z20828 Contact with and (suspected) exposure to other viral communicable diseases: Secondary | ICD-10-CM

## 2018-10-21 DIAGNOSIS — J4520 Mild intermittent asthma, uncomplicated: Secondary | ICD-10-CM

## 2018-10-21 DIAGNOSIS — I10 Essential (primary) hypertension: Secondary | ICD-10-CM

## 2018-10-21 MED ORDER — OSELTAMIVIR PHOSPHATE 75 MG PO CAPS: 75.0000 mg | ORAL_CAPSULE | Freq: Every day | ORAL | 0 refills | Status: DC

## 2018-10-21 NOTE — Progress Notes (Signed)
Subjective: Chief Complaint  Patient presents with  . sob    sob, cough X Sat  son flu positive   Here for SOB this past weekend x 2.5 days, worse yesterday.  Couldn't take full breaths yesterday.  Today bending over felt SOB.   Had sickliness a month ago but that was brief, and resolved.    She had recent travel to [place].  Has had flu contacts including son in house that was diagnosed with flu this past weekend.   Has slight cough, but no sore throat, no ear pain, no body aches, no chills.   No pain or swelling in calve.    No fever.  Denies significant fatigue, mainly SOB. Not using any inhaler.  No hx/o DVT or PE.   She is taking OCP birth control.  she is a nonsmoker.  No other aggravating or relieving factors. No other complaint.  Past Medical History:  Diagnosis Date  . Abnormal pap    lSIL in the past  . Anemia, iron deficiency    in past, not as of 05/2016  . Anxiety   . Asthma    childhood  . Complication of anesthesia    States "when I had kidney stones removed(Open); I had a build up of fluid in my lungs. But I dont really remember much about it otherwise".  . Herpes 04/05/2016  . History of pyelonephritis   . History of recurrent UTIs   . Human papilloma virus   . Hypertension   . Kidney stones   . Menorrhagia   . Nephrolithiasis   . Overweight(278.02)   . Proteinuria   . Renal stone   . Uterine fibroid   . Vaginal Pap smear, abnormal   . Wears contact lenses    Current Outpatient Medications on File Prior to Visit  Medication Sig Dispense Refill  . albuterol (PROVENTIL HFA;VENTOLIN HFA) 108 (90 Base) MCG/ACT inhaler Inhale 2 puffs into the lungs every 6 (six) hours as needed for wheezing or shortness of breath. 1 Inhaler 0  . amLODipine (NORVASC) 10 MG tablet Take 1 tablet (10 mg total) by mouth daily. 90 tablet 3  . cholecalciferol (VITAMIN D) 1000 units tablet Take 1 tablet (1,000 Units total) by mouth daily. 90 tablet 3  . Norgestimate-Ethinyl Estradiol Triphasic  (TRI-PREVIFEM) 0.18/0.215/0.25 MG-35 MCG tablet Take 1 tablet by mouth daily. 3 Package 4  . escitalopram (LEXAPRO) 10 MG tablet Take 1 tablet (10 mg total) by mouth daily. (Patient not taking: Reported on 06/17/2018) 30 tablet 6  . metroNIDAZOLE (FLAGYL) 500 MG tablet Take 1 tablet (500 mg total) by mouth 2 (two) times daily. (Patient not taking: Reported on 10/21/2018) 14 tablet 0   No current facility-administered medications on file prior to visit.    ROS as in subjective    Objective: BP 140/90   Pulse 65   Temp 98 F (36.7 C) (Oral)   Resp 16   Ht 5' 4.5" (1.638 m)   Wt 177 lb 6.4 oz (80.5 kg)   SpO2 97%   BMI 29.98 kg/m   Wt Readings from Last 3 Encounters:  10/21/18 177 lb 6.4 oz (80.5 kg)  06/17/18 173 lb (78.5 kg)  02/18/18 177 lb 8 oz (80.5 kg)    BP Readings from Last 3 Encounters:  10/21/18 140/90  06/17/18 130/86  02/18/18 134/77   General appearance: alert, no distress, WD/WN,  HEENT: normocephalic, sclerae anicteric, TMs pearly, nares patent, no discharge or erythema, pharynx normal Oral cavity: MMM,  no lesions Neck: supple, no lymphadenopathy, no thyromegaly, no masses Heart: RRR, normal S1, S2, no murmurs Lungs: CTA bilaterally, no wheezes, rhonchi, or rales Abdomen: +bs, soft, non tender, non distended, no masses, no hepatomegaly, no splenomegaly Pulses: 2+ symmetric, upper and lower extremities, normal cap refill Ext: no edema, no calve swelling or tenderness, no asymmetry, -homans  EKG indication hypertension, shortness of breath Rate 57 bpm, PR interval 152 ms, QRS 80 ms, QTC 438 ms, -18 degrees, sinus bradycardia, no changes from prior EKGs in 2016 or 2017    Assessment: Encounter Diagnoses  Name Primary?  . SOB (shortness of breath) Yes  . Exposure to the flu   . Essential hypertension   . Mild intermittent asthma without complication      Plan: Etiology is unclear but likely early symptoms that may be a precursor to flu since she has  had direct flu exposure in her household.  EKG unremarkable today compared to prior EKGs.  Blood pressures are usually within normal but elevated today.  She is compliant with blood pressure medication.  We discussed possible causes of shortness of breath including pulmonary embolism, cardiac related causes, anemia, electrolyte disturbance, infection and other.  Offered labs and other evaluation but she declined.  We will start with Tamiflu prophylaxis, rest, hydration, and she was advised to call return for any reason if worse or new symptoms within the next 48 hours.  Of note she is on birth control and had recent travel to [place], so she has some risk factors for PE, but no major findings today's to suggest that.  Mckenna was seen today for sob.  Diagnoses and all orders for this visit:  SOB (shortness of breath) -     EKG 12-Lead  Exposure to the flu  Essential hypertension  Mild intermittent asthma without complication  Other orders -     oseltamivir (TAMIFLU) 75 MG capsule; Take 1 capsule (75 mg total) by mouth daily.

## 2018-11-01 ENCOUNTER — Ambulatory Visit: Payer: BLUE CROSS/BLUE SHIELD | Admitting: Medical

## 2018-11-01 ENCOUNTER — Encounter: Payer: Self-pay | Admitting: Medical

## 2018-11-01 VITALS — BP 130/90 | HR 76 | Temp 98.3°F | Ht 64.0 in | Wt 177.4 lb

## 2018-11-01 DIAGNOSIS — L299 Pruritus, unspecified: Secondary | ICD-10-CM

## 2018-11-01 DIAGNOSIS — L989 Disorder of the skin and subcutaneous tissue, unspecified: Secondary | ICD-10-CM

## 2018-11-01 MED ORDER — TERBINAFINE HCL 1 % EX CREA: 1.0000 "application " | TOPICAL_CREAM | Freq: Two times a day (BID) | CUTANEOUS | 0 refills | Status: DC

## 2018-11-01 MED ORDER — HYDROXYZINE HCL 10 MG PO TABS: 10.0000 mg | ORAL_TABLET | Freq: Two times a day (BID) | ORAL | 0 refills | Status: DC

## 2018-11-01 NOTE — Progress Notes (Signed)
Started on back, itchy, everywhere, bumps, arms, thighs, , left thigh,   2wk  No soaps, cocnut oil, no other lotion, no new detergent, weave

## 2018-11-01 NOTE — Progress Notes (Signed)
  Subjective:     Patient ID: Angel Morris, female   DOB: [birth year], 43 y.o.   MRN: [id number]  HPI Chief Complaint  Patient presents with  . Pruritis    all over   Here for itching all over.  Started about 2 weeks ago.  Initially has had a rash on her right upper back.  At times has had small bumps and areas of itching such as right upper chest left upper chest, arms, thighs.  Currently only has a few bumps on her right neck and chest in the right upper back.  She denies any new exposures, no new clothing or bed sheets, uses the same lotions and soaps as normal.  No fever notes recent sickness.  No known scabies exposure.  No other aggravating or relieving factors. No other complaint.  Review of Systems As in subjective     Objective:   Physical Exam BP 130/90   Pulse 76   Temp 98.3 F (36.8 C) (Oral)   Ht 5\' 4"  (1.626 m)   Wt 177 lb 6.4 oz (80.5 kg)   SpO2 97%   BMI 30.45 kg/m   Gen: wd, wn, nad Skin: right upper back with 1.4cm diameter patch of erythema, few other small papules isolated right upper chest, right neck anteriorly, nonspecific, no other rash      Assessment:     Encounter Diagnoses  Name Primary?  . Pruritic dermatitis Yes  . Skin lesion        Plan:     We discussed her symptoms and findings.  Etiology is not clear.  The rash likely represents ringworm but cannot rule out early pityriasis rosea.  Given the fact that she is itchy all over, it is hard to say.  She will begin the medications below, we discussed calling back if any changes or new rash.  If not much improved within the next few days call back, if not completely resolved within 2 weeks call back  Cheril was seen today for pruritis.  Diagnoses and all orders for this visit:  Pruritic dermatitis  Skin lesion  Other orders -     hydrOXYzine (ATARAX/VISTARIL) 10 MG tablet; Take 1 tablet (10 mg total) by mouth 2 (two) times daily. -     terbinafine (LAMISIL AT) 1 % cream; Apply 1  application topically 2 (two) times daily.

## 2018-11-01 NOTE — Patient Instructions (Signed)
Recommendations:  Begin Lamisil topical cream to rash on right mid back  Begin Hydroxyzine tablet 1-2 times daily for itching  Avoid hot showers for now  Always wash new clothes or linens  If not much improved with itching in 3 days, then call back  If rash not gone within 2 weeks, then call back    Pruritus Pruritus is an itchy feeling on the skin. One of the most common causes is dry skin, but many different things can cause itching. Most cases of itching do not require medical attention. Sometimes itchy skin can turn into a rash. Follow these instructions at home: Skin care   Apply moisturizing lotion to your skin as needed. Lotion that contains petroleum jelly is best.  Take medicines or apply medicated creams only as told by your health care provider. This may include: ? Corticosteroid cream. ? Anti-itch lotions. ? Oral antihistamines.  Apply a cool, wet cloth (cool compress) to the affected areas.  Take baths with one of the following: ? Epsom salts. You can get these at your local pharmacy or grocery store. Follow the instructions on the packaging. ? Baking soda. Pour a small amount into the bath as told by your health care provider. ? Colloidal oatmeal. You can get this at your local pharmacy or grocery store. Follow the instructions on the packaging.  Apply baking soda paste to your skin. To make the paste, stir water into a small amount of baking soda until it reaches a paste-like consistency.  Do not scratch your skin.  Do not take hot showers or baths, which can make itching worse. A cool shower may help with itching as long as you apply moisturizing lotion after the shower.  Do not use scented soaps, detergents, perfumes, and cosmetic products. Instead, use gentle, unscented versions of these items. General instructions  Avoid wearing tight clothes.  Keep a journal to help find out what is causing your itching. Write down: ? What you eat and drink. ? What  cosmetic products you use. ? What soaps or detergents you use. ? What you wear, including jewelry.  Use a humidifier. This keeps the air moist, which helps to prevent dry skin.  Be aware of any changes in your itchiness. Contact a health care provider if:  The itching does not go away after several days.  You are unusually thirsty or urinating more than normal.  Your skin tingles or feels numb.  Your skin or the white parts of your eyes turn yellow (jaundice).  You feel weak.  You have any of the following: ? Night sweats. ? Tiredness (fatigue). ? Weight loss. ? Abdominal pain. Summary  Pruritus is an itchy feeling on the skin. One of the most common causes is dry skin, but many different conditions and factors can cause itching.  Apply moisturizing lotion to your skin as needed. Lotion that contains petroleum jelly is best.  Take medicines or apply medicated creams only as told by your health care provider.  Do not take hot showers or baths. Do not use scented soaps, detergents, perfumes, or cosmetic products. This information is not intended to replace advice given to you by your health care provider. Make sure you discuss any questions you have with your health care provider. Document Released: 04/26/2011 Document Revised: 08/28/2017 Document Reviewed: 08/28/2017 Elsevier Interactive Patient Education  2019 [employer].

## 2018-11-05 ENCOUNTER — Other Ambulatory Visit: Payer: Self-pay | Admitting: Medical

## 2018-11-05 ENCOUNTER — Telehealth: Payer: Self-pay

## 2018-11-05 MED ORDER — PREDNISONE 10 MG PO TABS: ORAL_TABLET | ORAL | 0 refills | Status: DC

## 2018-11-05 NOTE — Telephone Encounter (Signed)
I called and spoke to patient about her symptoms.  No major changes.  She just started the hydroxyzine 2 days ago instead of on the Friday when I saw her 3 days ago.  She is only tolerating the hydroxyzine once daily given sedation.  We will add prednisone at this point.  I clarified that she only use the fungus cream on her back on that one spot.  We discussed prednisone risk and benefits.  Advise she call back in 2 to 3 days let me know how things are going in case we need to change the plan, get labs refer to specialist

## 2018-11-05 NOTE — Telephone Encounter (Signed)
Patient states that the medication is not working and she is getting worse from Friday.   New patches are coming up.   Patient would like to know if she needs to come in or can you change her medications?  Please advise

## 2018-11-05 NOTE — Telephone Encounter (Signed)
Based on her symptoms last week she had 1 herald patch on her right back which could be fungus versus pityriasis rosea.  She had no other major symptoms at that time other than itching everywhere.  I advised that she gets a bunch of small little rash similar over her torso it could be pityriasis rosea that is not harmful but can get worse before it gets better.  Does she feel like that is the pattern she has now based on our discussion?  Or are her symptoms significantly different?  I assume she is using hydroxyzine twice daily for itching and rash

## 2018-11-08 ENCOUNTER — Telehealth: Payer: Self-pay | Admitting: Medical

## 2018-11-08 NOTE — Telephone Encounter (Signed)
Pt called and stated that with the prednisone this itching is better. However, now she is experiencing shortness of breath. She states that she was ask to let you know if that happened. Please advise pt at [phone]. pt states that she has been on albuterol before.

## 2018-11-11 NOTE — Telephone Encounter (Signed)
Only at work while laughing or picking boxes when bent over feels like that she cant catch her breath.  No chest pain.

## 2018-11-11 NOTE — Telephone Encounter (Signed)
Any calve pain, what is her pulse rate?  Get a little more info on her symptoms?   Any chest pain, is SOB worse with activity?

## 2018-11-12 NOTE — Telephone Encounter (Signed)
Left message on voicemail or patient to call back.

## 2018-11-12 NOTE — Telephone Encounter (Signed)
Have her check pulse.   At rest if over 100 bpm, let me know as this may be a concern  If not, then if still having these symptoms today, send for chest xray.

## 2018-11-13 ENCOUNTER — Other Ambulatory Visit: Payer: Self-pay

## 2018-11-13 ENCOUNTER — Other Ambulatory Visit: Payer: BLUE CROSS/BLUE SHIELD

## 2018-11-13 ENCOUNTER — Other Ambulatory Visit: Payer: Self-pay | Admitting: Medical

## 2018-11-13 DIAGNOSIS — R0600 Dyspnea, unspecified: Secondary | ICD-10-CM

## 2018-11-13 DIAGNOSIS — R252 Cramp and spasm: Secondary | ICD-10-CM | POA: Diagnosis not present

## 2018-11-13 NOTE — Telephone Encounter (Signed)
Patient notified.  Pulse was 78, she has developed leg cramps, when bending down cant catch her breath.   Angel Morris talked to her and wants her to come in for lab visit this afternoon.

## 2018-11-14 ENCOUNTER — Other Ambulatory Visit: Payer: Self-pay | Admitting: Medical

## 2018-11-14 DIAGNOSIS — R0602 Shortness of breath: Secondary | ICD-10-CM

## 2018-11-14 LAB — CBC
Hematocrit: 37.7 % (ref 34.0–46.6)
Hemoglobin: 13.2 g/dL (ref 11.1–15.9)
MCH: 28.6 pg (ref 26.6–33.0)
MCHC: 35 g/dL (ref 31.5–35.7)
MCV: 82 fL (ref 79–97)
PLATELETS: 273 10*3/uL (ref 150–450)
RBC: 4.61 x10E6/uL (ref 3.77–5.28)
RDW: 13.5 % (ref 11.7–15.4)
WBC: 11.4 10*3/uL — AB (ref 3.4–10.8)

## 2018-11-14 LAB — BASIC METABOLIC PANEL
BUN/Creatinine Ratio: 16 (ref 9–23)
BUN: 13 mg/dL (ref 6–24)
CO2: 25 mmol/L (ref 20–29)
CREATININE: 0.83 mg/dL (ref 0.57–1.00)
Calcium: 9.3 mg/dL (ref 8.7–10.2)
Chloride: 99 mmol/L (ref 96–106)
GFR calc Af Amer: 101 mL/min/{1.73_m2} (ref >59)
GFR calc non Af Amer: 87 mL/min/{1.73_m2} (ref >59)
GLUCOSE: 88 mg/dL (ref 65–99)
Potassium: 4.2 mmol/L (ref 3.5–5.2)
Sodium: 135 mmol/L (ref 134–144)

## 2018-12-16 ENCOUNTER — Encounter: Payer: Self-pay | Admitting: Medical

## 2018-12-16 ENCOUNTER — Other Ambulatory Visit: Payer: Self-pay

## 2018-12-16 ENCOUNTER — Ambulatory Visit (INDEPENDENT_AMBULATORY_CARE_PROVIDER_SITE_OTHER): Payer: BLUE CROSS/BLUE SHIELD | Admitting: Medical

## 2018-12-16 VITALS — Temp 99.2°F | Ht 64.0 in | Wt 175.0 lb

## 2018-12-16 DIAGNOSIS — R05 Cough: Secondary | ICD-10-CM

## 2018-12-16 DIAGNOSIS — R059 Cough, unspecified: Secondary | ICD-10-CM

## 2018-12-16 DIAGNOSIS — J988 Other specified respiratory disorders: Secondary | ICD-10-CM | POA: Diagnosis not present

## 2018-12-16 MED ORDER — HYDROCODONE-HOMATROPINE 5-1.5 MG/5ML PO SYRP: 5.0000 mL | ORAL_SOLUTION | Freq: Three times a day (TID) | ORAL | 0 refills | Status: AC | PRN

## 2018-12-16 NOTE — Progress Notes (Signed)
Subjective:     Patient ID: Angel Morris, female   DOB: 05/14/1976, 43 y.o.   MRN: [id number]  This visit type was conducted due to national recommendations for restrictions regarding the COVID-19 Pandemic (e.g. social distancing) in an effort to limit this patient's exposure and mitigate transmission in our community.  Due to their co-morbid illnesses, this patient is at least at moderate risk for complications without adequate follow up.  This format is felt to be most appropriate for this patient at this time.    Documentation for virtual audio and video telecommunications through Zoom encounter:  The patient was located at home. The provider was located in the office. The patient did consent to this visit and is aware of possible charges through their insurance for this visit.  The other persons participating in this telemedicine service were none. Time spent on call was 15 minutes and in review of previous records >20 minutes total.  This virtual service is not related to other E/M service within previous 7 days.   HPI Chief Complaint  Patient presents with  . cough    cough, sore throat, congestion, X Wednesday    Virtual visit today for cough and congestion.  She has a 4 to 5-day history of cough, sore throat, nasal congestion.  Can seem to get rid of nasal congestion and cough.  Taking some Tylenol Cold and Flu.  Feels like this is a regular common cold.  Started Wednesday with sore throat, thought it was related to pollen.   Denies fever.   No wheezing, no SOB.   No NVD, no loss of smell or taste.   No body aches, no chills.   No recent travel, no sick contacts with covid 19.   No one in house been sick.    Is getting up mucous with blowing nose and cough.   Mucous is mixture of clear sometimes, sometimes yellow green.   It is improving to more clear mucous.   No other aggravating or relieving factors. No other complaint.  Past Medical History:  Diagnosis Date  . Abnormal  pap    lSIL in the past  . Anemia, iron deficiency    in past, not as of 05/2016  . Anxiety   . Asthma    childhood  . Complication of anesthesia    States "when I had kidney stones removed(Open); I had a build up of fluid in my lungs. But I dont really remember much about it otherwise".  . Herpes 04/05/2016  . History of pyelonephritis   . History of recurrent UTIs   . Human papilloma virus   . Hypertension   . Kidney stones   . Menorrhagia   . Nephrolithiasis   . Overweight(278.02)   . Proteinuria   . Renal stone   . Uterine fibroid   . Vaginal Pap smear, abnormal   . Wears contact lenses     Review of Systems As in subjective    Objective:   Physical Exam  Temp 99.2 F (37.3 C) (Oral)   Ht 5\' 4"  (1.626 m)   Wt 175 lb (79.4 kg)   BMI 30.04 kg/m   Due to coronavirus pandemic stay at home measures, patient visit was virtual and they were not examined in person.   General: No acute distress, answers questions appropriate and in complete sentences, no obvious cough or wheezing on the virtual visit     Assessment:     Encounter Diagnoses  Name Primary?  Marland Kitchen  Respiratory tract infection Yes  . Cough        Plan:     We discussed symptoms of COVID-19 versus common cold.  We also discussed that I cannot completely rule out COVID even with mild symptoms.  I suspect that she has a common cold though given the symptoms and lack of fever and shortness of breath and chest pain and other symptoms more related to COVID-19.  She is already on the downtrend it sounds with her symptoms improving.  She will continue Tylenol cold and sinus as needed.  She can use Hycodan syrup blood for worse cough, caution with sedation.  Rest, hydrate well, and symptoms should improve over the next few days.  I gave her a work note.  We discussed symptoms that would prompt a call back if much worse.  Discussed self quarantine at home during this time until symptoms have completely resolved and at least  7 days from start of illness has transpired  Angel Morris was seen today for cough.  Diagnoses and all orders for this visit:  Respiratory tract infection  Cough  Other orders -     HYDROcodone-homatropine (HYCODAN) 5-1.5 MG/5ML syrup; Take 5 mLs by mouth every 8 (eight) hours as needed for up to 5 days.  Follow-up PRN

## 2018-12-16 NOTE — Progress Notes (Signed)
done

## 2019-01-09 ENCOUNTER — Encounter: Payer: Self-pay | Admitting: Medical

## 2019-01-09 ENCOUNTER — Ambulatory Visit: Payer: BLUE CROSS/BLUE SHIELD | Admitting: Medical

## 2019-01-09 ENCOUNTER — Other Ambulatory Visit: Payer: Self-pay

## 2019-01-09 VITALS — BP 136/84 | HR 85 | Temp 98.5°F | Resp 16 | Ht 64.0 in | Wt 178.6 lb

## 2019-01-09 DIAGNOSIS — R42 Dizziness and giddiness: Secondary | ICD-10-CM | POA: Diagnosis not present

## 2019-01-09 DIAGNOSIS — M791 Myalgia, unspecified site: Secondary | ICD-10-CM

## 2019-01-09 DIAGNOSIS — T50905A Adverse effect of unspecified drugs, medicaments and biological substances, initial encounter: Secondary | ICD-10-CM

## 2019-01-09 LAB — POCT URINE PREGNANCY: Preg Test, Ur: NEGATIVE

## 2019-01-09 LAB — COMPREHENSIVE METABOLIC PANEL
ALT: 10 IU/L (ref 0–32)
AST: 12 IU/L (ref 0–40)
Albumin/Globulin Ratio: 1.6 (ref 1.2–2.2)
Albumin: 4.1 g/dL (ref 3.8–4.8)
Alkaline Phosphatase: 62 IU/L (ref 39–117)
BUN/Creatinine Ratio: 22 (ref 9–23)
BUN: 15 mg/dL (ref 6–24)
Bilirubin Total: 0.3 mg/dL (ref 0.0–1.2)
CO2: 28 mmol/L (ref 20–29)
Calcium: 8.8 mg/dL (ref 8.7–10.2)
Chloride: 102 mmol/L (ref 96–106)
Creatinine, Ser: 0.68 mg/dL (ref 0.57–1.00)
GFR calc Af Amer: 124 mL/min/{1.73_m2} (ref >59)
GFR calc non Af Amer: 107 mL/min/{1.73_m2} (ref >59)
Globulin, Total: 2.6 g/dL (ref 1.5–4.5)
Glucose: 124 mg/dL — ABNORMAL HIGH (ref 65–99)
Potassium: 4.1 mmol/L (ref 3.5–5.2)
Sodium: 135 mmol/L (ref 134–144)
Total Protein: 6.7 g/dL (ref 6.0–8.5)

## 2019-01-09 LAB — POCT URINALYSIS DIP (PROADVANTAGE DEVICE)
Bilirubin, UA: NEGATIVE
Blood, UA: NEGATIVE
Glucose, UA: NEGATIVE mg/dL
Ketones, POC UA: NEGATIVE mg/dL
Leukocytes, UA: NEGATIVE
Nitrite, UA: NEGATIVE
Specific Gravity, Urine: 1.015
Urobilinogen, Ur: NEGATIVE
pH, UA: 6 (ref 5.0–8.0)

## 2019-01-09 LAB — CBC
Hematocrit: 37.1 % (ref 34.0–46.6)
Hemoglobin: 12.6 g/dL (ref 11.1–15.9)
MCH: 28.4 pg (ref 26.6–33.0)
MCHC: 34 g/dL (ref 31.5–35.7)
MCV: 84 fL (ref 79–97)
Platelets: 219 10*3/uL (ref 150–450)
RBC: 4.43 x10E6/uL (ref 3.77–5.28)
RDW: 14 % (ref 11.7–15.4)
WBC: 8.7 10*3/uL (ref 3.4–10.8)

## 2019-01-09 LAB — CK: Total CK: 129 U/L (ref 32–182)

## 2019-01-09 MED ORDER — MECLIZINE HCL 25 MG PO TABS: 25.0000 mg | ORAL_TABLET | Freq: Two times a day (BID) | ORAL | 0 refills | Status: DC

## 2019-01-09 NOTE — Progress Notes (Signed)
Subjective:     Patient ID: Angel Morris, female   DOB: 1975-12-04, 43 y.o.   MRN: [id number]  HPI Chief Complaint  Patient presents with  . lite headed    lite headed, headache, vomit sob on going X ! week   took 2000mg  of pain away (tylenol)  last thursday   She notes that last week she started doing some different exercise, and 1 day in particular she was feeling quite sore.  Her friend encouraged her to take some Tylenol.  Normally she always uses ibuprofen for aches or pains and only takes for the over-the-counter ibuprofens at a given time when she uses this as needed.  However on this day she accidentally took 4 Tylenol which would equal 2000 mg.  She started feeling lightheaded, and started feeling jittery sometime later.  She ended up making herself throw up.  And over the next few days continue to feel bad, headache, nausea at times, and was worried about toxicity of the Tylenol.  She is continued to feel lightheaded over the last few days.  This is intermittent noted.  1 minute can feel find and later can feel lightheaded.  She denies chest pain, no shortness of breath, no swelling, no urine changes, no burning with urination no frequency no dark urine, no belly pain or back pain, no respiratory symptoms no cough no congestion, no fever.  No problems with bowels.  She does feel some improved but was worried, so she want to come in and get checked out.   Past Medical History:  Diagnosis Date  . Abnormal pap    lSIL in the past  . Anemia, iron deficiency    in past, not as of 05/2016  . Anxiety   . Asthma    childhood  . Complication of anesthesia    States "when I had kidney stones removed(Open); I had a build up of fluid in my lungs. But I dont really remember much about it otherwise".  . Herpes 04/05/2016  . History of pyelonephritis   . History of recurrent UTIs   . Human papilloma virus   . Hypertension   . Kidney stones   . Menorrhagia   . Nephrolithiasis   .  Overweight(278.02)   . Proteinuria   . Renal stone   . Uterine fibroid   . Vaginal Pap smear, abnormal   . Wears contact lenses    Current Outpatient Medications on File Prior to Visit  Medication Sig Dispense Refill  . amLODipine (NORVASC) 10 MG tablet Take 1 tablet (10 mg total) by mouth daily. 90 tablet 3  . cholecalciferol (VITAMIN D) 1000 units tablet Take 1 tablet (1,000 Units total) by mouth daily. 90 tablet 3  . Norgestimate-Ethinyl Estradiol Triphasic (TRI-PREVIFEM) 0.18/0.215/0.25 MG-35 MCG tablet Take 1 tablet by mouth daily. 3 Package 4  . albuterol (PROVENTIL HFA;VENTOLIN HFA) 108 (90 Base) MCG/ACT inhaler Inhale 2 puffs into the lungs every 6 (six) hours as needed for wheezing or shortness of breath. (Patient not taking: Reported on 12/16/2018) 1 Inhaler 0  . escitalopram (LEXAPRO) 10 MG tablet Take 1 tablet (10 mg total) by mouth daily. (Patient not taking: Reported on 12/16/2018) 30 tablet 6  . hydrOXYzine (ATARAX/VISTARIL) 10 MG tablet Take 1 tablet (10 mg total) by mouth 2 (two) times daily. (Patient not taking: Reported on 12/16/2018) 30 tablet 0  . terbinafine (LAMISIL AT) 1 % cream Apply 1 application topically 2 (two) times daily. (Patient not taking: Reported on 12/16/2018)  30 g 0   No current facility-administered medications on file prior to visit.      Review of Systems As in subjective    Objective:   Physical Exam  BP 136/84   Pulse 85   Temp 98.5 F (36.9 C) (Oral)   Resp 16   Ht 5\' 4"  (1.626 m)   Wt 178 lb 9.6 oz (81 kg)   SpO2 98%   BMI 30.66 kg/m     General appearance: alert, no distress, WD/WN,  HEENT: normocephalic, sclerae anicteric, PERRLA, EOMi, nares patent, no discharge or erythema, pharynx normal Oral cavity: MMM, no lesions Neck: supple, no lymphadenopathy, no thyromegaly, no masses Heart: RRR, normal S1, S2, no murmurs Lungs: CTA bilaterally, no wheezes, rhonchi, or rales Abdomen: +bs, soft, non tender, non distended, no masses, no  hepatomegaly, no splenomegaly Musculoskeletal: nontender, no swelling, no obvious deformity Extremities: no edema, no cyanosis, no clubbing Pulses: 2+ symmetric, upper and lower extremities, normal cap refill Neurological: alert, oriented x 3, CN2-12 intact, strength normal upper extremities and lower extremities, sensation normal throughout, DTRs 2+ throughout, no cerebellar signs, gait normal Psychiatric: normal affect, behavior normal, pleasant       Assessment:     Encounter Diagnoses  Name Primary?  . Lightheaded Yes  . Myalgia   . Medication adverse effect, initial encounter        Plan:     Etiology unclear, but could be BPPV.  I reviewed recent EKG and labs in chart.   Discussed differential.   Labs today.  Exam today in office normal, vitals ok, Upreg negative.   Pending labs, if normal, will begin trial of Meclizine, rest, hydration.     Adeliz was seen today for lite headed.  Diagnoses and all orders for this visit:  Lightheaded -     Comprehensive metabolic panel -     CK -     CBC -     Cancel: POCT urinalysis dipstick -     POCT urine pregnancy -     POCT Urinalysis DIP (Proadvantage Device)  Myalgia -     Cancel: POCT urinalysis dipstick -     POCT urine pregnancy -     POCT Urinalysis DIP (Proadvantage Device)  Medication adverse effect, initial encounter -     Comprehensive metabolic panel -     CK -     CBC  Other orders -     meclizine (ANTIVERT) 25 MG tablet; Take 1 tablet (25 mg total) by mouth 2 (two) times daily.

## 2019-01-27 ENCOUNTER — Other Ambulatory Visit: Payer: Self-pay | Admitting: Medical

## 2019-01-27 DIAGNOSIS — I10 Essential (primary) hypertension: Secondary | ICD-10-CM

## 2019-01-31 ENCOUNTER — Telehealth: Payer: Self-pay | Admitting: Medical

## 2019-01-31 NOTE — Telephone Encounter (Signed)
Pt called stating co worker positive for COVID And she wants to be tested.  Went into work and they didn't tell them til after they had been there for 30 minutes then sent them home, she is concerned for her kids and her 43 year old mother that she has been around.  Said job was shut down for deep cleaning now.  Gave her [employer] testing #

## 2019-03-19 ENCOUNTER — Other Ambulatory Visit: Payer: Self-pay | Admitting: Obstetrics & Gynecology

## 2019-03-31 ENCOUNTER — Telehealth: Payer: Self-pay | Admitting: Medical

## 2019-03-31 ENCOUNTER — Other Ambulatory Visit: Payer: Self-pay | Admitting: Medical

## 2019-03-31 DIAGNOSIS — I10 Essential (primary) hypertension: Secondary | ICD-10-CM

## 2019-03-31 MED ORDER — AMLODIPINE BESYLATE 10 MG PO TABS: 10.0000 mg | ORAL_TABLET | Freq: Every day | ORAL | 0 refills | Status: DC

## 2019-03-31 NOTE — Telephone Encounter (Signed)
Med refill sent in.

## 2019-03-31 NOTE — Telephone Encounter (Signed)
Pt lost her Amlodipine and been having headaches. Hasn't checked BP, would like refill sent in

## 2019-05-19 ENCOUNTER — Ambulatory Visit: Payer: BC Managed Care – PPO | Admitting: Medical

## 2019-05-19 ENCOUNTER — Other Ambulatory Visit: Payer: Self-pay

## 2019-05-19 ENCOUNTER — Encounter: Payer: Self-pay | Admitting: Medical

## 2019-05-19 VITALS — BP 120/80 | HR 74 | Temp 98.0°F | Ht 64.0 in | Wt 166.4 lb

## 2019-05-19 DIAGNOSIS — G479 Sleep disorder, unspecified: Secondary | ICD-10-CM | POA: Diagnosis not present

## 2019-05-19 DIAGNOSIS — R0683 Snoring: Secondary | ICD-10-CM | POA: Diagnosis not present

## 2019-05-19 DIAGNOSIS — R232 Flushing: Secondary | ICD-10-CM | POA: Diagnosis not present

## 2019-05-19 MED ORDER — ZOLPIDEM TARTRATE 10 MG PO TABS: ORAL_TABLET | ORAL | 0 refills | Status: DC

## 2019-05-19 NOTE — Progress Notes (Addendum)
Subjective: Chief Complaint  Patient presents with  . Insomnia    x2 months-   Here for complaint of sleep issues.  Has not had sleep issues in the past, but just in the recent months.  She has trouble staying asleep.  Can only get to sleep okay.  She typically is waking up between 12 AM and 2 AM wide open.  She notes in the past not using sleep aids but recently took sleep aids over-the-counter including Benadryl and melatonin.  Taking either long did not help.  Taking double dose of Benadryl melatonin together seem to really help.  She does snore, no concern for sleep apnea, no witnessed apnea.  She does note some daytime somnolence.  She notes prior sleep study 5 years ago that was reportedly normal.  She has been getting some mild headaches.  She notes some stress, trying to buy a house.  Otherwise does not necessarily feel stressed.  She also has a concern for whether this is related to menopause.  She has a history of uterine ablation for heavy bleeding.  She does get some hot flashes  Past Medical History:  Diagnosis Date  . Abnormal pap    lSIL in the past  . Anemia, iron deficiency    in past, not as of 05/2016  . Anxiety   . Asthma    childhood  . Complication of anesthesia    States "when I had kidney stones removed(Open); I had a build up of fluid in my lungs. But I dont really remember much about it otherwise".  . Herpes 04/05/2016  . History of pyelonephritis   . History of recurrent UTIs   . Human papilloma virus   . Hypertension   . Kidney stones   . Menorrhagia   . Nephrolithiasis   . Overweight(278.02)   . Proteinuria   . Renal stone   . Uterine fibroid   . Vaginal Pap smear, abnormal   . Wears contact lenses    Current Outpatient Medications on File Prior to Visit  Medication Sig Dispense Refill  . amLODipine (NORVASC) 10 MG tablet Take 1 tablet (10 mg total) by mouth daily. 90 tablet 0  . cholecalciferol (VITAMIN D) 1000 units tablet Take 1 tablet (1,000 Units  total) by mouth daily. 90 tablet 3  . TRI-PREVIFEM 0.18/0.215/0.25 MG-35 MCG tablet TAKE 1 TABLET BY MOUTH EVERY DAY 84 tablet 4  . albuterol (PROVENTIL HFA;VENTOLIN HFA) 108 (90 Base) MCG/ACT inhaler Inhale 2 puffs into the lungs every 6 (six) hours as needed for wheezing or shortness of breath. (Patient not taking: Reported on 12/16/2018) 1 Inhaler 0  . escitalopram (LEXAPRO) 10 MG tablet Take 1 tablet (10 mg total) by mouth daily. (Patient not taking: Reported on 12/16/2018) 30 tablet 6  . hydrOXYzine (ATARAX/VISTARIL) 10 MG tablet Take 1 tablet (10 mg total) by mouth 2 (two) times daily. (Patient not taking: Reported on 12/16/2018) 30 tablet 0  . meclizine (ANTIVERT) 25 MG tablet Take 1 tablet (25 mg total) by mouth 2 (two) times daily. (Patient not taking: Reported on 05/19/2019) 30 tablet 0  . terbinafine (LAMISIL AT) 1 % cream Apply 1 application topically 2 (two) times daily. (Patient not taking: Reported on 12/16/2018) 30 g 0   No current facility-administered medications on file prior to visit.    Past Surgical History:  Procedure Laterality Date  . CERVICAL CONIZATION W/BX N/A 10/08/2013   Procedure: CONIZATION CERVIX WITH BIOPSY;  Surgeon: Florian Buff, MD;  Location: [facility];  Service: Gynecology;  Laterality: N/A;  . DILATION AND CURETTAGE OF UTERUS    . DILITATION & CURRETTAGE/HYSTROSCOPY WITH NOVASURE ABLATION N/A 07/02/2015   Procedure: DILATATION & CURETTAGE/HYSTEROSCOPY WITH NOVASURE ABLATION;  Surgeon: Florian Buff, MD;  Location: [facility];  Service: Gynecology;  Laterality: N/A;  Uterine Cavity Length 5.5 Uterine Cavity Width 3.5 Power 106 Time 1 minute 4 seconds  . HOLMIUM LASER APPLICATION N/A [id number]   Procedure: HOLMIUM LASER APPLICATION;  Surgeon: Florian Buff, MD;  Location: [facility];  Service: Gynecology;  Laterality: N/A;  . kidney stones  june 2000   open removal  . rotary cuff - left shoulder  2005    ROS as in subjective   Objective: BP 120/80   Pulse 74    Temp 98 F (36.7 C)   Ht 5\' 4"  (1.626 m)   Wt 166 lb 6.4 oz (75.5 kg)   SpO2 96%   BMI 28.56 kg/m    Wt Readings from Last 3 Encounters:  05/19/19 166 lb 6.4 oz (75.5 kg)  01/09/19 178 lb 9.6 oz (81 kg)  12/16/18 175 lb (79.4 kg)     General appearance: alert, no distress, WD/WN,  oral cavity: MMM, no lesions, no enlarged tonsils Neck: supple, no lymphadenopathy, no thyromegaly, no masses Heart: RRR, normal S1, S2, no murmurs Lungs: CTA bilaterally, no wheezes, rhonchi, or rales Pulses: 2+ symmetric, upper and lower extremities, normal cap refill Neurological: alert, oriented x 3, CN2-12 intact, strength normal upper extremities and lower extremities, sensation normal throughout, DTRs 2+ throughout, no cerebellar signs, gait normal Psychiatric: normal affect, behavior normal, pleasant     Assessment: Encounter Diagnoses  Name Primary?  . Sleep disturbance Yes  . Hot flashes   . Snoring     Plan: We discussed her concerns and possible causes.  Stress could be playing a role.  We discussed deal of stress, journaling, avoiding caffeine and alcohol late eating, avoiding screen time late evening.  Continue to get regular exercise.  Begin trial of zolpidem as below.  Follow-up in 2 weeks.  Consider sleep study  Aye was seen today for insomnia.  Diagnoses and all orders for this visit:  Sleep disturbance  Hot flashes  Snoring  Other orders -     zolpidem (AMBIEN) 10 MG tablet; 1/2-1 tablet po QHS prn

## 2019-05-22 ENCOUNTER — Telehealth: Payer: Self-pay | Admitting: *Deleted

## 2019-05-22 NOTE — Telephone Encounter (Signed)
Patient left message stating that she thinks she has bv.

## 2019-05-23 NOTE — Telephone Encounter (Signed)
LMOVM returning patient's call.  

## 2019-05-26 ENCOUNTER — Telehealth: Payer: Self-pay | Admitting: *Deleted

## 2019-05-26 NOTE — Telephone Encounter (Signed)
Returning call.

## 2019-05-27 ENCOUNTER — Telehealth: Payer: Self-pay | Admitting: *Deleted

## 2019-05-27 NOTE — Telephone Encounter (Signed)
I spoke with pt. Closing this encounter. Angel Morris

## 2019-05-27 NOTE — Telephone Encounter (Signed)
Pt thinks she has BV. Pt has a vaginal odor. Some discharge last week, none this week. Can you order med for BV? Thanks!! [place]

## 2019-05-28 MED ORDER — METRONIDAZOLE 500 MG PO TABS: 500.0000 mg | ORAL_TABLET | Freq: Two times a day (BID) | ORAL | 0 refills | Status: DC

## 2019-05-28 NOTE — Telephone Encounter (Signed)
rx flagyl for BV

## 2019-06-12 ENCOUNTER — Other Ambulatory Visit: Payer: Self-pay | Admitting: Medical

## 2019-07-04 ENCOUNTER — Encounter: Payer: Self-pay | Admitting: Medical

## 2019-07-04 ENCOUNTER — Other Ambulatory Visit: Payer: Self-pay

## 2019-07-04 ENCOUNTER — Ambulatory Visit (INDEPENDENT_AMBULATORY_CARE_PROVIDER_SITE_OTHER): Payer: BC Managed Care – PPO | Admitting: Medical

## 2019-07-04 VITALS — BP 120/80 | HR 72 | Temp 98.7°F | Ht 64.0 in | Wt 167.6 lb

## 2019-07-04 DIAGNOSIS — E559 Vitamin D deficiency, unspecified: Secondary | ICD-10-CM | POA: Diagnosis not present

## 2019-07-04 DIAGNOSIS — Z87442 Personal history of urinary calculi: Secondary | ICD-10-CM

## 2019-07-04 DIAGNOSIS — Z0000 Encounter for general adult medical examination without abnormal findings: Secondary | ICD-10-CM

## 2019-07-04 DIAGNOSIS — I10 Essential (primary) hypertension: Secondary | ICD-10-CM

## 2019-07-04 DIAGNOSIS — J4520 Mild intermittent asthma, uncomplicated: Secondary | ICD-10-CM | POA: Diagnosis not present

## 2019-07-04 DIAGNOSIS — Z87448 Personal history of other diseases of urinary system: Secondary | ICD-10-CM

## 2019-07-04 NOTE — Progress Notes (Signed)
Subjective:   HPI  Angel Morris is a 43 y.o. female who presents for a complete physical.  Medical team: [facility] for dentist Eye doctor Glade Lloyd, Camelia Eng, PA-C here for primary care  Concerns: feeling a little sad as her children are growing up, out of the house.  Youngest son just got offer to play football at college.  daughter just went off to college.  His son Darryl just started seeing me for new onset diabetes few months ago.     No recent problems with asthma  Needs biometric form completed  Reviewed their medical, surgical, family, social, medication, and allergy history and updated chart as appropriate.  Past Medical History:  Diagnosis Date  . Abnormal pap    lSIL in the past  . Anemia, iron deficiency    in past, not as of 05/2016  . Anxiety   . Asthma    childhood  . Complication of anesthesia    States "when I had kidney stones removed(Open); I had a build up of fluid in my lungs. But I dont really remember much about it otherwise".  . Herpes 04/05/2016  . History of pyelonephritis   . History of recurrent UTIs   . Human papilloma virus   . Hypertension   . Menorrhagia   . Nephrolithiasis   . Overweight(278.02)   . Proteinuria   . Uterine fibroid   . Wears contact lenses     Past Surgical History:  Procedure Laterality Date  . CERVICAL CONIZATION W/BX N/A 10/08/2013   Procedure: CONIZATION CERVIX WITH BIOPSY;  Surgeon: Florian Buff, MD;  Location: [facility];  Service: Gynecology;  Laterality: N/A;  . DILATION AND CURETTAGE OF UTERUS    . DILITATION & CURRETTAGE/HYSTROSCOPY WITH NOVASURE ABLATION N/A 07/02/2015   Procedure: DILATATION & CURETTAGE/HYSTEROSCOPY WITH NOVASURE ABLATION;  Surgeon: Florian Buff, MD;  Location: [facility];  Service: Gynecology;  Laterality: N/A;  Uterine Cavity Length 5.5 Uterine Cavity Width 3.5 Power 106 Time 1 minute 4 seconds  . HOLMIUM LASER APPLICATION N/A [id number]   Procedure: HOLMIUM LASER  APPLICATION;  Surgeon: Florian Buff, MD;  Location: [facility];  Service: Gynecology;  Laterality: N/A;  . kidney stones  june 2000   open removal  . rotary cuff - left shoulder  2005    Social History   Socioeconomic History  . Marital status: Divorced    Spouse name: Not on file  . Number of children: Not on file  . Years of education: Not on file  . Highest education level: Not on file  Occupational History  . Not on file  Social Needs  . Financial resource strain: Not on file  . Food insecurity    Worry: Not on file    Inability: Not on file  . Transportation needs    Medical: Not on file    Non-medical: Not on file  Tobacco Use  . Smoking status: Never Smoker  . Smokeless tobacco: Never Used  Substance and Sexual Activity  . Alcohol use: Yes    Comment: rare  . Drug use: No  . Sexual activity: Yes    Birth control/protection: Pill, Surgical    Comment: ablation  Lifestyle  . Physical activity    Days per week: Not on file    Minutes per session: Not on file  . Stress: Not on file  Relationships  . Social connections    Talks on phone: Not on file  Gets together: Not on file    Attends religious service: Not on file    Active member of club or organization: Not on file    Attends meetings of clubs or organizations: Not on file    Relationship status: Not on file  . Intimate partner violence    Fear of current or ex partner: Not on file    Emotionally abused: Not on file    Physically abused: Not on file    Forced sexual activity: Not on file  Other Topics Concern  . Not on file  Social History Narrative   Lives with 2 of her children, has 4 children.   Works at [employer], works in Academic librarian.  Exercise - just walking on the job.   As of 06/2019    Family History  Problem Relation Age of Onset  . Fibromyalgia Mother   . Arthritis Mother        RA  . Hypertension Mother   . Diabetes Mother   . Diabetes Brother   . Hypertension Brother    . Diabetes Father        diabetes, amputation  . Heart disease Father 23       died of MI 02-06-17  . Kidney disease Father        dialysis  . Diabetes Son   . Stroke Maternal Grandmother   . Aneurysm Maternal Grandfather   . Stroke Maternal Grandfather   . Cancer Neg Hx      Current Outpatient Medications:  .  amLODipine (NORVASC) 10 MG tablet, Take 1 tablet (10 mg total) by mouth daily., Disp: 90 tablet, Rfl: 0 .  Cholecalciferol (VITAMIN D3) 25 MCG (1000 UT) CAPS, TAKE 1 CAPSULE BY MOUTH EVERY DAY, Disp: 90 capsule, Rfl: 0 .  TRI-PREVIFEM 0.18/0.215/0.25 MG-35 MCG tablet, TAKE 1 TABLET BY MOUTH EVERY DAY, Disp: 84 tablet, Rfl: 4 .  zolpidem (AMBIEN) 10 MG tablet, 1/2-1 tablet po QHS prn, Disp: 15 tablet, Rfl: 0 .  escitalopram (LEXAPRO) 10 MG tablet, Take 1 tablet (10 mg total) by mouth daily. (Patient not taking: Reported on 12/16/2018), Disp: 30 tablet, Rfl: 6  No Known Allergies   Review of Systems Constitutional: -fever, -chills, -sweats, -unexpected weight change, -decreased appetite, -fatigue Allergy: -sneezing, -itching, -congestion Dermatology: -changing moles, --rash, -lumps ENT: -runny nose, -ear pain, -sore throat, -hoarseness, -sinus pain, -teeth pain, - ringing in ears, -hearing loss, -nosebleeds Cardiology: -chest pain, -palpitations, -swelling, -difficulty breathing when lying flat, -waking up short of breath Respiratory: -cough, -shortness of breath, -difficulty breathing with exercise or exertion, -wheezing, -coughing up blood Gastroenterology: -abdominal pain, -nausea, -vomiting, -diarrhea, -constipation, -blood in stool, -changes in bowel movement, -difficulty swallowing or eating Hematology: -bleeding, -bruising  Musculoskeletal: -joint aches, -muscle aches, -joint swelling, -back pain, -neck pain, -cramping, -changes in gait Ophthalmology: denies vision changes, eye redness, itching, discharge Urology: -burning with urination, -difficulty urinating, -blood in  urine, -urinary frequency, -urgency, -incontinence Neurology: -headache, -weakness, -tingling, -numbness, -memory loss, -falls, -dizziness Psychology: -depressed mood, -agitation, -sleep problems, -SI/HI     Objective:   Physical Exam  BP 120/80   Pulse 72   Temp 98.7 F (37.1 C)   Ht 5\' 4"  (1.626 m)   Wt 167 lb 9.6 oz (76 kg)   SpO2 97%   BMI 28.77 kg/m   Wt Readings from Last 3 Encounters:  07/04/19 167 lb 9.6 oz (76 kg)  05/19/19 166 lb 6.4 oz (75.5 kg)  01/09/19 178 lb 9.6  oz (81 kg)    General appearance: alert, no distress, WD/WN, AA female Skin: no worrisome findings HEENT: normocephalic, conjunctiva/corneas normal, sclerae anicteric, PERRLA, EOMi, nares patent, no discharge or erythema, pharynx normal Oral cavity: MMM, tongue normal, teeth in good repair Neck: supple, no lymphadenopathy, no thyromegaly, no masses, normal ROM, no bruits Chest: non tender, normal shape and expansion Heart: RRR, normal S1, S2, no murmurs Lungs: CTA bilaterally, no wheezes, rhonchi, or rales Abdomen: +bs, soft, non tender, non distended, no masses, no hepatomegaly, no splenomegaly, no bruits Back: non tender, normal ROM, no scoliosis Musculoskeletal: tender anterior right ankle, but normal ROM, no deformity other than flat feet, left deltoid surgical scars, otherwise upper extremities non tender, no obvious deformity, normal ROM throughout, lower extremities non tender, no obvious deformity, normal ROM throughout Extremities: no edema, no cyanosis, no clubbing Pulses: 2+ symmetric, upper and lower extremities, normal cap refill Neurological: alert, oriented x 3, CN2-12 intact, strength normal upper extremities and lower extremities, sensation normal throughout, DTRs 2+ throughout, no cerebellar signs, gait normal Psychiatric: normal affect, behavior normal, pleasant  Breast/gyn/rectal - deferred to gyn    Assessment and Plan :    Encounter Diagnoses  Name Primary?  . Routine general  medical examination at a health care facility Yes  . Vitamin D deficiency   . Essential hypertension   . Mild intermittent asthma without complication   . History of renal stone   . History of pyelonephritis     Physical exam - discussed healthy lifestyle, diet, exercise, preventative care, vaccinations, and addressed their concerns.   See your eye doctor yearly for routine vision care. See your dentist yearly for routine dental care including hygiene visits twice yearly. See your gynecologist yearly for routine gynecological care. C/t same medications Completed her biometric form for work She had a flu shot at work recently  HTN - at goal. C/t current medication.   Follow-up pending lab  [facility] was seen today for annual exam.  Diagnoses and all orders for this visit:  Routine general medical examination at a health care facility -     Lipid Panel -     Vitamin D, 25-hydroxy -     Comprehensive metabolic panel -     Hemoglobin A1c  Vitamin D deficiency -     Vitamin D, 25-hydroxy  Essential hypertension -     Lipid Panel -     Comprehensive metabolic panel  Mild intermittent asthma without complication  History of renal stone  History of pyelonephritis

## 2019-07-05 LAB — COMPREHENSIVE METABOLIC PANEL
ALT: 9 IU/L (ref 0–32)
AST: 13 IU/L (ref 0–40)
Albumin/Globulin Ratio: 1.7 (ref 1.2–2.2)
Albumin: 4.2 g/dL (ref 3.8–4.8)
Alkaline Phosphatase: 57 IU/L (ref 39–117)
BUN/Creatinine Ratio: 12 (ref 9–23)
BUN: 10 mg/dL (ref 6–24)
Bilirubin Total: 0.7 mg/dL (ref 0.0–1.2)
CO2: 24 mmol/L (ref 20–29)
Calcium: 9.1 mg/dL (ref 8.7–10.2)
Chloride: 102 mmol/L (ref 96–106)
Creatinine, Ser: 0.82 mg/dL (ref 0.57–1.00)
GFR calc Af Amer: 101 mL/min/{1.73_m2} (ref >59)
GFR calc non Af Amer: 88 mL/min/{1.73_m2} (ref >59)
Globulin, Total: 2.5 g/dL (ref 1.5–4.5)
Glucose: 84 mg/dL (ref 65–99)
Potassium: 4 mmol/L (ref 3.5–5.2)
Sodium: 138 mmol/L (ref 134–144)
Total Protein: 6.7 g/dL (ref 6.0–8.5)

## 2019-07-05 LAB — LIPID PANEL
Chol/HDL Ratio: 1.9 ratio (ref 0.0–4.4)
Cholesterol, Total: 163 mg/dL (ref 100–199)
HDL: 86 mg/dL (ref >39)
LDL Chol Calc (NIH): 66 mg/dL (ref 0–99)
Triglycerides: 56 mg/dL (ref 0–149)
VLDL Cholesterol Cal: 11 mg/dL (ref 5–40)

## 2019-07-05 LAB — VITAMIN D 25 HYDROXY (VIT D DEFICIENCY, FRACTURES): Vit D, 25-Hydroxy: 29.1 ng/mL — ABNORMAL LOW (ref 30.0–100.0)

## 2019-07-05 LAB — HEMOGLOBIN A1C
Est. average glucose Bld gHb Est-mCnc: 108 mg/dL
Hgb A1c MFr Bld: 5.4 % (ref 4.8–5.6)

## 2019-07-07 ENCOUNTER — Other Ambulatory Visit: Payer: Self-pay | Admitting: Medical

## 2019-07-07 DIAGNOSIS — I10 Essential (primary) hypertension: Secondary | ICD-10-CM

## 2019-07-07 MED ORDER — VITAMIN D3 25 MCG (1000 UT) PO CAPS: 2.0000 | ORAL_CAPSULE | Freq: Every day | ORAL | 3 refills | Status: DC

## 2019-07-07 MED ORDER — AMLODIPINE BESYLATE 10 MG PO TABS: 10.0000 mg | ORAL_TABLET | Freq: Every day | ORAL | 3 refills | Status: DC

## 2019-08-01 DIAGNOSIS — Z03818 Encounter for observation for suspected exposure to other biological agents ruled out: Secondary | ICD-10-CM | POA: Diagnosis not present

## 2019-08-01 DIAGNOSIS — Z20828 Contact with and (suspected) exposure to other viral communicable diseases: Secondary | ICD-10-CM | POA: Diagnosis not present

## 2019-08-01 DIAGNOSIS — Z9189 Other specified personal risk factors, not elsewhere classified: Secondary | ICD-10-CM | POA: Diagnosis not present

## 2019-08-11 ENCOUNTER — Other Ambulatory Visit: Payer: Self-pay

## 2019-08-11 ENCOUNTER — Encounter: Payer: Self-pay | Admitting: Adult Health

## 2019-08-11 ENCOUNTER — Ambulatory Visit (INDEPENDENT_AMBULATORY_CARE_PROVIDER_SITE_OTHER): Payer: BC Managed Care – PPO | Admitting: Adult Health

## 2019-08-11 ENCOUNTER — Other Ambulatory Visit (HOSPITAL_COMMUNITY)
Admission: RE | Admit: 2019-08-11 | Discharge: 2019-08-11 | Disposition: A | Discharge status: Home / Self Care | Payer: BC Managed Care – PPO | Source: Ambulatory Visit | Attending: Adult Health | Admitting: Adult Health

## 2019-08-11 VITALS — BP 132/89 | HR 62 | Ht 64.0 in | Wt 171.4 lb

## 2019-08-11 DIAGNOSIS — Z1212 Encounter for screening for malignant neoplasm of rectum: Secondary | ICD-10-CM | POA: Diagnosis not present

## 2019-08-11 DIAGNOSIS — Z113 Encounter for screening for infections with a predominantly sexual mode of transmission: Secondary | ICD-10-CM | POA: Diagnosis not present

## 2019-08-11 DIAGNOSIS — Z3009 Encounter for other general counseling and advice on contraception: Secondary | ICD-10-CM | POA: Diagnosis not present

## 2019-08-11 DIAGNOSIS — Z01419 Encounter for gynecological examination (general) (routine) without abnormal findings: Secondary | ICD-10-CM

## 2019-08-11 DIAGNOSIS — Z3041 Encounter for surveillance of contraceptive pills: Secondary | ICD-10-CM | POA: Insufficient documentation

## 2019-08-11 DIAGNOSIS — Z1211 Encounter for screening for malignant neoplasm of colon: Secondary | ICD-10-CM | POA: Diagnosis not present

## 2019-08-11 LAB — HEMOCCULT GUIAC POC 1CARD (OFFICE): Fecal Occult Blood, POC: NEGATIVE

## 2019-08-11 MED ORDER — NORGESTIM-ETH ESTRAD TRIPHASIC 0.18/0.215/0.25 MG-35 MCG PO TABS: 1.0000 | ORAL_TABLET | Freq: Every day | ORAL | 4 refills | Status: DC

## 2019-08-11 NOTE — Progress Notes (Signed)
Patient ID: Angel Morris, female   DOB: 06/02/1976, 43 y.o.   MRN: [id number] History of Present Illness: Angel Morris is a 43 year old black female, divorced, [id number] in for a well woman gyn exam, she had a normal pap with negative HPV 02/18/18.She has family planning medicaid.  PCP is Chana Bode PA in [place].    Current Medications, Allergies, Past Medical History, Past Surgical History, Family History and Social History were reviewed in [employer] record.     Review of Systems: Patient denies any headaches, hearing loss, fatigue, blurred vision, shortness of breath, chest pain, abdominal pain, problems with bowel movements, urination, or intercourse. No joint pain or mood swings. Still has period after ablation, is on OCs.    Physical Exam:BP 132/89 (BP Location: Left Arm, Patient Position: Sitting, Cuff Size: Normal)   Pulse 62   Ht 5\' 4"  (1.626 m)   Wt 171 lb 6.4 oz (77.7 kg)   BMI 29.42 kg/m  General:  Well developed, well nourished, no acute distress Skin:  Warm and dry Neck:  Midline trachea, normal thyroid, good ROM, no lymphadenopathy Lungs; Clear to auscultation bilaterally Breast:  No dominant palpable mass, retraction, or nipple discharge Cardiovascular: Regular rate and rhythm Abdomen:  Soft, non tender, no hepatosplenomegaly Pelvic:  External genitalia is normal in appearance, no lesions.  The vagina is normal in appearance. Urethra has no lesions or masses. The cervix is bulbous.  Uterus is felt to be normal size, shape, and contour.  No adnexal masses or tenderness noted.Bladder is non tender, no masses felt.CV swab obtained. Rectal: Good sphincter tone, no polyps, or hemorrhoids felt.  Hemoccult negative. Extremities/musculoskeletal:  No swelling or varicosities noted, no clubbing or cyanosis Psych:  No mood changes, alert and cooperative,seems happy Fall risk is low PHQ 2 score is 0. Examination chaperoned by Rolena Infante  LPN  Impression and Plan: 1. Encounter for well woman exam with routine gynecological exam Physical in 1 year Pap in 2022 Mammogram yearly  Labs with PCP   2. Screening for colorectal cancer  3. Family planning CV swab sent Check HIV and RPR  4. Encounter for surveillance of contraceptive pills Meds ordered this encounter  Medications  . Norgestimate-Ethinyl Estradiol Triphasic (TRI-PREVIFEM) 0.18/0.215/0.25 MG-35 MCG tablet    Sig: Take 1 tablet by mouth daily.    Dispense:  84 tablet    Refill:  4    Order Specific Question:   Supervising Provider    Answer:   Elonda Husky, LUTHER H [[id number]]    5. Screening examination for STD (sexually transmitted disease) CV swab sent Check HIV and RPR

## 2019-08-12 LAB — HIV ANTIBODY (ROUTINE TESTING W REFLEX): HIV Screen 4th Generation wRfx: NONREACTIVE

## 2019-08-12 LAB — RPR: RPR Ser Ql: NONREACTIVE

## 2019-08-13 LAB — CERVICOVAGINAL ANCILLARY ONLY
Chlamydia: NEGATIVE
Comment: NEGATIVE
Comment: NEGATIVE
Comment: NORMAL
Neisseria Gonorrhea: NEGATIVE
Trichomonas: NEGATIVE

## 2019-09-04 ENCOUNTER — Other Ambulatory Visit: Payer: Self-pay | Admitting: Medical

## 2019-09-19 ENCOUNTER — Telehealth: Payer: Self-pay | Admitting: Medical

## 2019-09-19 NOTE — Telephone Encounter (Signed)
Pt called and wanted to see if she could get a refill on her vitamin D. She said since she has been taking 2 a day she is out and the pharmacy is saying its too soon to get them refill. [facility] [place]

## 2019-09-22 ENCOUNTER — Other Ambulatory Visit: Payer: Self-pay | Admitting: Medical

## 2019-09-22 MED ORDER — VITAMIN D3 25 MCG (1000 UT) PO CAPS: 2.0000 | ORAL_CAPSULE | Freq: Every day | ORAL | 3 refills | Status: AC

## 2019-09-22 NOTE — Telephone Encounter (Signed)
I just sent refill vitamin D to take 2 daily which would be 2000 units daily.

## 2019-11-21 ENCOUNTER — Other Ambulatory Visit: Payer: Self-pay

## 2019-11-21 ENCOUNTER — Ambulatory Visit: Payer: BC Managed Care – PPO | Admitting: Medical

## 2019-11-21 ENCOUNTER — Encounter: Payer: Self-pay | Admitting: Medical

## 2019-11-21 VITALS — BP 146/90 | HR 58 | Temp 98.6°F | Ht 64.0 in | Wt 173.6 lb

## 2019-11-21 DIAGNOSIS — Z87448 Personal history of other diseases of urinary system: Secondary | ICD-10-CM | POA: Diagnosis not present

## 2019-11-21 DIAGNOSIS — Z87442 Personal history of urinary calculi: Secondary | ICD-10-CM

## 2019-11-21 DIAGNOSIS — R109 Unspecified abdominal pain: Secondary | ICD-10-CM | POA: Insufficient documentation

## 2019-11-21 DIAGNOSIS — R1031 Right lower quadrant pain: Secondary | ICD-10-CM | POA: Diagnosis not present

## 2019-11-21 LAB — POCT URINALYSIS DIP (PROADVANTAGE DEVICE)
Bilirubin, UA: NEGATIVE
Glucose, UA: NEGATIVE mg/dL
Ketones, POC UA: NEGATIVE mg/dL
Leukocytes, UA: NEGATIVE
Nitrite, UA: NEGATIVE
Protein Ur, POC: NEGATIVE mg/dL
Specific Gravity, Urine: 1.015
Urobilinogen, Ur: NEGATIVE
pH, UA: 6.5 (ref 5.0–8.0)

## 2019-11-21 LAB — POCT UA - MICROSCOPIC ONLY
Bacteria, U Microscopic: NEGATIVE
Casts, Ur, LPF, POC: NEGATIVE
Crystals, Ur, HPF, POC: NEGATIVE
Epithelial cells, urine per micros: NEGATIVE
Mucus, UA: NEGATIVE
RBC, urine, microscopic: NEGATIVE
WBC, Ur, HPF, POC: NEGATIVE
Yeast, UA: NEGATIVE

## 2019-11-21 LAB — POCT URINE PREGNANCY: Preg Test, Ur: NEGATIVE

## 2019-11-21 MED ORDER — SULFAMETHOXAZOLE-TRIMETHOPRIM 800-160 MG PO TABS: 1.0000 | ORAL_TABLET | Freq: Two times a day (BID) | ORAL | 0 refills | Status: DC

## 2019-11-21 MED ORDER — HYDROCODONE-ACETAMINOPHEN 5-325 MG PO TABS: 1.0000 | ORAL_TABLET | Freq: Four times a day (QID) | ORAL | 0 refills | Status: DC | PRN

## 2019-11-21 NOTE — Progress Notes (Signed)
Subjective: Chief Complaint  Patient presents with  . Back Pain    kidney area with dull ache in pelvic area    She notes 1 days history of back pain, aching in pelvic area.   Back pain gradually worsened throughout the day.  This morning pain in right lower pelvis.  Starting to feel bad.   No concern for back injury, no heavy lifting, trauma.    Body is starting to be achy.  No chills.  No nausea, no vomiting.  No fever.  No stool changes, no diarrhea, no constipation, no blood in urine.    No urinary frequency, no urgency, no blood in urine.  No vaginal discharge.  No new partner or concern for STD.   Does have hx/o kidney stone and pyelonephritis several times in the past.  Has hx/o recurrent UTI, prior daily prophylactic antibiotics.  Past Medical History:  Diagnosis Date  . Abnormal pap    lSIL in the past  . Anemia, iron deficiency    in past, not as of 05/2016  . Anxiety   . Asthma    childhood  . Complication of anesthesia    States "when I had kidney stones removed(Open); I had a build up of fluid in my lungs. But I dont really remember much about it otherwise".  . Herpes 04/05/2016  . History of pyelonephritis   . History of recurrent UTIs   . Human papilloma virus   . Hypertension   . Menorrhagia   . Nephrolithiasis   . Overweight(278.02)   . Proteinuria   . Uterine fibroid   . Vaginal Pap smear, abnormal   . Wears contact lenses    Current Outpatient Medications on File Prior to Visit  Medication Sig Dispense Refill  . amLODipine (NORVASC) 10 MG tablet Take 1 tablet (10 mg total) by mouth daily. 90 tablet 3  . Cholecalciferol (VITAMIN D3) 25 MCG (1000 UT) CAPS Take 2 capsules (2,000 Units total) by mouth daily. 180 capsule 3  . Norgestimate-Ethinyl Estradiol Triphasic (TRI-PREVIFEM) 0.18/0.215/0.25 MG-35 MCG tablet Take 1 tablet by mouth daily. 84 tablet 4   No current facility-administered medications on file prior to visit.    Past Surgical History:   Procedure Laterality Date  . CERVICAL CONIZATION W/BX N/A 10/08/2013   Procedure: CONIZATION CERVIX WITH BIOPSY;  Surgeon: Florian Buff, MD;  Location: [facility];  Service: Gynecology;  Laterality: N/A;  . DILATION AND CURETTAGE OF UTERUS    . DILITATION & CURRETTAGE/HYSTROSCOPY WITH NOVASURE ABLATION N/A 07/02/2015   Procedure: DILATATION & CURETTAGE/HYSTEROSCOPY WITH NOVASURE ABLATION;  Surgeon: Florian Buff, MD;  Location: [facility];  Service: Gynecology;  Laterality: N/A;  Uterine Cavity Length 5.5 Uterine Cavity Width 3.5 Power 106 Time 1 minute 4 seconds  . HOLMIUM LASER APPLICATION N/A [id number]   Procedure: HOLMIUM LASER APPLICATION;  Surgeon: Florian Buff, MD;  Location: [facility];  Service: Gynecology;  Laterality: N/A;  . kidney stones  june 2000   open removal  . rotary cuff - left shoulder  2005   ROS as in subjective    Objective: BP (!) 146/90   Pulse (!) 58   Temp 98.6 F (37 C)   Ht 5\' 4"  (1.626 m)   Wt 173 lb 9.6 oz (78.7 kg)   SpO2 99%   BMI 29.80 kg/m    General appearence: alert, no distress, WD/WN, African American, somewhat ill appearing Abdomen: +bs, soft,moderate right sided tendnerss but worse RLQ, non distended, no masses,  no hepatomegaly, no splenomegaly Pulses: 2+ symmetric, upper and lower extremities, normal cap refill Back: + right CVA tenderness     Assessment: Encounter Diagnoses  Name Primary?  . Flank pain Yes  . Abdominal pain, unspecified abdominal location   . History of pyelonephritis   . History of renal stone   . Right lower quadrant abdominal pain     Plan: Urinalysis + for blood.  Urine micro reviewed, unremarkable  She has hx/o stones and pyelonephritis.   Prior multiple hospitalization for pyelonephritis, and can quickly acutely change from early symptoms to full on pyelonephritis in short time period.     Begin empiric medication below.  After discussing options for eval, she wants to hold off on labs, scan, and await  culture.    Advised rest, hydration, begin medications, call back Monday.   If worse over the weekend, go to the emergency [place] was seen today for back pain.  Diagnoses and all orders for this visit:  Flank pain -     Urine Culture -     Cancel: Basic metabolic panel -     Cancel: CBC -     POCT urine pregnancy -     POCT Urinalysis DIP (Proadvantage Device) -     POCT UA - Microscopic Only  Abdominal pain, unspecified abdominal location -     Urine Culture -     Cancel: Basic metabolic panel -     Cancel: CBC -     POCT urine pregnancy -     POCT Urinalysis DIP (Proadvantage Device) -     POCT UA - Microscopic Only  History of pyelonephritis -     Urine Culture -     Cancel: Basic metabolic panel -     Cancel: CBC -     POCT urine pregnancy -     POCT Urinalysis DIP (Proadvantage Device) -     POCT UA - Microscopic Only  History of renal stone -     Urine Culture -     Cancel: Basic metabolic panel -     Cancel: CBC -     POCT urine pregnancy -     POCT Urinalysis DIP (Proadvantage Device) -     POCT UA - Microscopic Only  Right lower quadrant abdominal pain -     Urine Culture -     Cancel: CT Abdomen Pelvis W Contrast; Future -     Cancel: Basic metabolic panel -     Cancel: CBC -     POCT urine pregnancy -     POCT Urinalysis DIP (Proadvantage Device) -     POCT UA - Microscopic Only  Other orders -     HYDROcodone-acetaminophen (NORCO) 5-325 MG tablet; Take 1 tablet by mouth every 6 (six) hours as needed. -     sulfamethoxazole-trimethoprim (BACTRIM DS) 800-160 MG tablet; Take 1 tablet by mouth 2 (two) times daily.

## 2019-11-22 LAB — URINE CULTURE

## 2020-01-07 ENCOUNTER — Telehealth: Payer: Self-pay | Admitting: Adult Health

## 2020-01-07 MED ORDER — METRONIDAZOLE 500 MG PO TABS: 500.0000 mg | ORAL_TABLET | Freq: Two times a day (BID) | ORAL | 1 refills | Status: DC

## 2020-01-07 NOTE — Telephone Encounter (Signed)
Will rx flagyl  

## 2020-01-07 NOTE — Addendum Note (Signed)
Addended by: Derrek Monaco A on: 01/07/2020 04:43 PM   Modules accepted: Orders

## 2020-01-07 NOTE — Telephone Encounter (Addendum)
Pt gets reoccuring BV. Can you send prescription to pharmacy? Has noticed a fishy odor. Thanks!! Angel Morris

## 2020-01-07 NOTE — Telephone Encounter (Signed)
Patient called stating that she would like a call back from [place]. Patient did not state the reason for the call. Please contact pt

## 2020-05-21 ENCOUNTER — Ambulatory Visit: Payer: BC Managed Care – PPO | Admitting: Family Medicine

## 2020-05-21 ENCOUNTER — Other Ambulatory Visit: Payer: Self-pay

## 2020-05-21 ENCOUNTER — Encounter: Payer: Self-pay | Admitting: Family Medicine

## 2020-05-21 VITALS — BP 120/72 | HR 76 | Temp 98.4°F | Wt 167.2 lb

## 2020-05-21 DIAGNOSIS — T7840XA Allergy, unspecified, initial encounter: Secondary | ICD-10-CM

## 2020-05-21 MED ORDER — HYDROXYZINE HCL 10 MG PO TABS: 10.0000 mg | ORAL_TABLET | Freq: Three times a day (TID) | ORAL | 0 refills | Status: DC | PRN

## 2020-05-21 MED ORDER — EPINEPHRINE 0.3 MG/0.3ML IJ SOAJ: 0.3000 mg | INTRAMUSCULAR | 0 refills | Status: DC | PRN

## 2020-05-21 MED ORDER — PREDNISONE 10 MG (21) PO TBPK: ORAL_TABLET | Freq: Every day | ORAL | 0 refills | Status: DC

## 2020-05-21 NOTE — Progress Notes (Signed)
° °  Subjective:    Patient ID: Angel Morris, female    DOB: [birth year], 44 y.o.   MRN: [id number]  HPI Chief Complaint  Patient presents with   face swollen    face swollen face and itching all over over the last couple days.    She is here with complaints of a pruritic rash all over. States her lips were swollen yesterday and again this morning but not currently. Denies fever, chills, tongue swelling, cough, chest pain, palpitations, shortness of breath, N/V/D.   States symptoms started after eating "a lot of shrimp" and steak at [employer] 2 days ago. She felt tingling initially around her lips and chin.   Denies history of allergies to food or to anything else.   She took a Benadryl yesterday and a non-drowsy allergy pill this morning.   States itching is very bothersome.   Reviewed allergies, medications, past medical, surgical, family, and social history.   Review of Systems Pertinent positives and negatives in the history of present illness.     Objective:   Physical Exam BP 120/72    Pulse 76    Temp 98.4 F (36.9 C)    Wt 167 lb 3.2 oz (75.8 kg)    SpO2 98%    BMI 28.70 kg/m   Alert and in no distress but she is visibly uncomfortable.  Pharyngeal area is normal. No oral edema. Neck is supple without adenopathy.  Cardiac exam shows a regular rhythm. Lungs are clear to auscultation. She has a pruritic, red, diffuse rash.       Assessment & Plan:  Allergic reaction, initial encounter - Plan: predniSONE (STERAPRED UNI-PAK 21 TAB) 10 MG (21) TBPK tablet, EPINEPHrine 0.3 mg/0.3 mL IJ SOAJ injection, hydrOXYzine (ATARAX/VISTARIL) 10 MG tablet  No angioedema, no red flags. Suspect she is now has developed an allergy to something she ate at [employer]. Recommend avoiding shrimp for now.  I will treat her with oral steroids, hydroxyzine and will also prescribe epi-pens. Discussed sedating nature of hydroxyzine. Demonstrated how to properly use epi pens and that if she has to  use these that she call 911 or go to the ED.  She will also start taking an OTC non drowsy allergy medication daily.  Advised that if she has any new oral edema, shortness of breath, wheezing that she should call 911 or go to the ED.

## 2020-05-21 NOTE — Patient Instructions (Signed)
Avoid shrimp for now.   Take a non drowsy antihistamine such as Xyzal, Allegra or Claritin daily.   Take the steroid dose pak - tapering dose as prescribed. Drink plenty of water.  You may have some side effects such as difficulty sleeping, feeling hot or flushed.   If you get much worse such as lips or tongue swelling, difficulty breathing then use the epi-pen and go to the emergency department or call 911.

## 2020-06-21 ENCOUNTER — Encounter: Payer: Self-pay | Admitting: Medical

## 2020-06-21 ENCOUNTER — Other Ambulatory Visit: Payer: Self-pay

## 2020-06-21 ENCOUNTER — Ambulatory Visit: Payer: BC Managed Care – PPO | Admitting: Medical

## 2020-06-21 VITALS — BP 116/78 | HR 71 | Temp 98.4°F | Ht 64.0 in | Wt 167.4 lb

## 2020-06-21 DIAGNOSIS — Z87442 Personal history of urinary calculi: Secondary | ICD-10-CM | POA: Diagnosis not present

## 2020-06-21 DIAGNOSIS — Z87448 Personal history of other diseases of urinary system: Secondary | ICD-10-CM | POA: Diagnosis not present

## 2020-06-21 DIAGNOSIS — R300 Dysuria: Secondary | ICD-10-CM | POA: Diagnosis not present

## 2020-06-21 DIAGNOSIS — M549 Dorsalgia, unspecified: Secondary | ICD-10-CM | POA: Diagnosis not present

## 2020-06-21 LAB — POCT URINALYSIS DIP (PROADVANTAGE DEVICE)
Bilirubin, UA: NEGATIVE
Glucose, UA: NEGATIVE mg/dL
Ketones, POC UA: NEGATIVE mg/dL
Leukocytes, UA: NEGATIVE
Nitrite, UA: NEGATIVE
Specific Gravity, Urine: 1.025
Urobilinogen, Ur: 0.2
pH, UA: 6 (ref 5.0–8.0)

## 2020-06-21 LAB — POCT URINE PREGNANCY: Preg Test, Ur: NEGATIVE

## 2020-06-21 MED ORDER — SULFAMETHOXAZOLE-TRIMETHOPRIM 800-160 MG PO TABS: 1.0000 | ORAL_TABLET | Freq: Two times a day (BID) | ORAL | 0 refills | Status: DC

## 2020-06-21 MED ORDER — HYDROCODONE-ACETAMINOPHEN 5-325 MG PO TABS: 1.0000 | ORAL_TABLET | Freq: Four times a day (QID) | ORAL | 0 refills | Status: DC | PRN

## 2020-06-21 NOTE — Progress Notes (Signed)
Subjective:  Angel Morris is a 44 y.o. female who presents for Chief Complaint  Patient presents with  . Urinary Tract Infection    sevre back pain, pelvic pain with lower abdorminal pressure      Here for possible urinary infection.  Started about 5 days ago with significant back pain.  Thought she may have pulled a muscle but has not been doing anything to cause injury.  No heavy lifting, no strenuous activity.  Their last few days she has had worse belly pressure and pelvic pressure.  She does have a history of pyelonephritis and occasional urinary tract infection.  She cannot seem to get comfortable, hurts to bend, pain in the center of her back and on the left side of low back.  No fever, no urinary odor, no vomiting no diarrhea no vaginal discharge.  Her urine is a little darker then in recent days.  She does have some nausea.  She has had a history of pyelonephritis with not very significant symptoms in the past.  She also has a history of kidney stone.  No other aggravating or relieving factors.    No other c/o.  The following portions of the patient's history were reviewed and updated as appropriate: allergies, current medications, past family history, past medical history, past social history, past surgical history and problem list.  ROS Otherwise as in subjective above  Objective: BP 116/78   Pulse 71   Temp 98.4 F (36.9 C)   Ht 5\' 4"  (1.626 m)   Wt 167 lb 6.4 oz (75.9 kg)   SpO2 98%   BMI 28.73 kg/m   General appearance: alert, no distress, well developed, well nourished Heart: RRR, normal S1, S2, no murmurs Lungs: CTA bilaterally, no wheezes, rhonchi, or rales Abdomen: +bs, soft, generalized mid to lower abdominal tenderness, otherwise, non distended, no masses, no hepatomegaly, no splenomegaly Left lower and mid lumbar tenderness, some mid lumbar and lower lumbar midline tenderness, mild pain with range of motion in general, seems uncomfortable in general otherwise  nontender Pulses: 2+ radial pulses, 2+ pedal pulses, normal cap refill Ext: no edema   Assessment: Encounter Diagnoses  Name Primary?  . Dysuria Yes  . Acute left-sided back pain, unspecified back location   . History of pyelonephritis   . History of renal calculi      Plan: We discussed her symptoms and concerns possible causes.  I reviewed her 2017 CT scans.  I suspect stone or possible early pyelonephritis.  She has history of pyelonephritis with minimal symptoms in the past.  Begin pain medicine and antibiotic below.  Hydrate well.  Rest.  We discussed a low threshold for recheck or urgent evaluation here, emergency department or urgent care.  Pending labs consider CT scan.  We will go ahead and start process of referral back to urology.  Tylisa was seen today for urinary tract infection.  Diagnoses and all orders for this visit:  Dysuria -     Urine Culture -     Basic metabolic panel -     CBC with Differential/Platelet -     POCT Urinalysis DIP (Proadvantage Device) -     POCT urine pregnancy -     Ambulatory referral to Urology  Acute left-sided back pain, unspecified back location -     Urine Culture -     Basic metabolic panel -     CBC with Differential/Platelet -     POCT Urinalysis DIP (Proadvantage Device) -  POCT urine pregnancy -     Ambulatory referral to Urology  History of pyelonephritis -     Urine Culture -     Basic metabolic panel -     CBC with Differential/Platelet -     POCT Urinalysis DIP (Proadvantage Device) -     POCT urine pregnancy -     Ambulatory referral to Urology  History of renal calculi -     Urine Culture -     Basic metabolic panel -     CBC with Differential/Platelet -     POCT Urinalysis DIP (Proadvantage Device) -     POCT urine pregnancy -     Ambulatory referral to Urology  Other orders -     sulfamethoxazole-trimethoprim (BACTRIM DS) 800-160 MG tablet; Take 1 tablet by mouth 2 (two) times daily. -      HYDROcodone-acetaminophen (NORCO) 5-325 MG tablet; Take 1 tablet by mouth every 6 (six) hours as needed.    Follow up: Pending labs

## 2020-06-22 LAB — CBC WITH DIFFERENTIAL/PLATELET
Basophils Absolute: 0 10*3/uL (ref 0.0–0.2)
Basos: 0 %
EOS (ABSOLUTE): 0.1 10*3/uL (ref 0.0–0.4)
Eos: 1 %
Hematocrit: 37 % (ref 34.0–46.6)
Hemoglobin: 12.7 g/dL (ref 11.1–15.9)
Immature Grans (Abs): 0 10*3/uL (ref 0.0–0.1)
Immature Granulocytes: 1 %
Lymphocytes Absolute: 2.2 10*3/uL (ref 0.7–3.1)
Lymphs: 25 %
MCH: 27.9 pg (ref 26.6–33.0)
MCHC: 34.3 g/dL (ref 31.5–35.7)
MCV: 81 fL (ref 79–97)
Monocytes Absolute: 0.5 10*3/uL (ref 0.1–0.9)
Monocytes: 6 %
Neutrophils Absolute: 6 10*3/uL (ref 1.4–7.0)
Neutrophils: 67 %
Platelets: 242 10*3/uL (ref 150–450)
RBC: 4.55 x10E6/uL (ref 3.77–5.28)
RDW: 12.9 % (ref 11.7–15.4)
WBC: 8.7 10*3/uL (ref 3.4–10.8)

## 2020-06-22 LAB — BASIC METABOLIC PANEL
BUN/Creatinine Ratio: 15 (ref 9–23)
BUN: 12 mg/dL (ref 6–24)
CO2: 22 mmol/L (ref 20–29)
Calcium: 9 mg/dL (ref 8.7–10.2)
Chloride: 104 mmol/L (ref 96–106)
Creatinine, Ser: 0.81 mg/dL (ref 0.57–1.00)
GFR calc Af Amer: 102 mL/min/{1.73_m2} (ref >59)
GFR calc non Af Amer: 89 mL/min/{1.73_m2} (ref >59)
Glucose: 111 mg/dL — ABNORMAL HIGH (ref 65–99)
Potassium: 4.2 mmol/L (ref 3.5–5.2)
Sodium: 138 mmol/L (ref 134–144)

## 2020-06-23 ENCOUNTER — Telehealth: Payer: Self-pay | Admitting: Medical

## 2020-06-23 LAB — URINE CULTURE: Organism ID, Bacteria: NO GROWTH

## 2020-06-23 NOTE — Telephone Encounter (Signed)
Continue plan for referral to urology soon.  Does she want to pursue scan now or just try to get into urology for further consult

## 2020-06-23 NOTE — Telephone Encounter (Signed)
Pt called and results was read to her. She stated that she does not feel worse but she doesn't feel better. She said the pain medication made her nauseous and she vomited so she stopped it.

## 2020-06-24 ENCOUNTER — Other Ambulatory Visit (INDEPENDENT_AMBULATORY_CARE_PROVIDER_SITE_OTHER): Payer: BC Managed Care – PPO | Admitting: *Deleted

## 2020-06-24 ENCOUNTER — Encounter: Payer: Self-pay | Admitting: *Deleted

## 2020-06-24 ENCOUNTER — Other Ambulatory Visit (HOSPITAL_COMMUNITY)
Admission: RE | Admit: 2020-06-24 | Discharge: 2020-06-24 | Disposition: A | Discharge status: Home / Self Care | Payer: BC Managed Care – PPO | Source: Ambulatory Visit | Attending: Obstetrics & Gynecology | Admitting: Obstetrics & Gynecology

## 2020-06-24 VITALS — Wt 168.6 lb

## 2020-06-24 DIAGNOSIS — B9689 Other specified bacterial agents as the cause of diseases classified elsewhere: Secondary | ICD-10-CM | POA: Insufficient documentation

## 2020-06-24 DIAGNOSIS — N760 Acute vaginitis: Secondary | ICD-10-CM | POA: Diagnosis not present

## 2020-06-24 DIAGNOSIS — Z113 Encounter for screening for infections with a predominantly sexual mode of transmission: Secondary | ICD-10-CM

## 2020-06-24 NOTE — Progress Notes (Addendum)
   NURSE VISIT- VAGINITIS/STD/POC  SUBJECTIVE:  Angel Morris is a 44 y.o. [id number] GYN patientfemale here for a vaginal swab for vaginitis screening, STD screen.  She reports the following symptoms: none for 1 week.  She has been having lower back pain for 1 week and has seen her PCP for this.  She was prescribed an antibiotic for a possible UTI but culture was negative. She is wanting to r/o STD's, vaginitis, etc as she is still experiencing the pain Denies significant pelvic pain, fever, or UTI symptoms and did have a period last month which is not normal fer her since having her ablation.  OBJECTIVE:  There were no vitals taken for this visit.  Appears well, in no apparent distress  ASSESSMENT: Vaginal swab for STD screen  PLAN: Self-collected vaginal probe for Gonorrhea, Chlamydia, Trichomonas, Bacterial Vaginosis, Yeast sent to lab Treatment: to be determined once results are received Follow-up as needed if symptoms persist/worsen, or new symptoms develop  Angel Morris  06/24/2020 9:47 AM   Chart reviewed for nurse visit. Agree with plan of care.  Christin Fudge, [place] 06/24/2020 7:19 PM

## 2020-06-24 NOTE — Telephone Encounter (Signed)
Called patient and there was no answer. Sent patient a message via mychart to let us know if she wants scan now.

## 2020-06-25 LAB — CERVICOVAGINAL ANCILLARY ONLY
Bacterial Vaginitis (gardnerella): POSITIVE — AB
Candida Glabrata: NEGATIVE
Candida Vaginitis: NEGATIVE
Chlamydia: NEGATIVE
Comment: NEGATIVE
Comment: NEGATIVE
Comment: NEGATIVE
Comment: NEGATIVE
Comment: NEGATIVE
Comment: NORMAL
Neisseria Gonorrhea: NEGATIVE
Trichomonas: NEGATIVE

## 2020-06-28 ENCOUNTER — Other Ambulatory Visit: Payer: Self-pay | Admitting: Advanced Practice Midwife

## 2020-06-28 MED ORDER — METRONIDAZOLE 500 MG PO TABS: 500.0000 mg | ORAL_TABLET | Freq: Two times a day (BID) | ORAL | 0 refills | Status: DC

## 2020-06-28 NOTE — Progress Notes (Signed)
Flagyl for BV

## 2020-07-05 ENCOUNTER — Encounter: Payer: Self-pay | Admitting: Medical

## 2020-07-05 ENCOUNTER — Ambulatory Visit (INDEPENDENT_AMBULATORY_CARE_PROVIDER_SITE_OTHER): Payer: BC Managed Care – PPO | Admitting: Medical

## 2020-07-05 ENCOUNTER — Other Ambulatory Visit: Payer: Self-pay

## 2020-07-05 ENCOUNTER — Telehealth: Payer: Self-pay | Admitting: Medical

## 2020-07-05 VITALS — BP 130/84 | HR 71 | Ht 64.0 in | Wt 169.4 lb

## 2020-07-05 DIAGNOSIS — Z7185 Encounter for immunization safety counseling: Secondary | ICD-10-CM

## 2020-07-05 DIAGNOSIS — E559 Vitamin D deficiency, unspecified: Secondary | ICD-10-CM | POA: Diagnosis not present

## 2020-07-05 DIAGNOSIS — Z87448 Personal history of other diseases of urinary system: Secondary | ICD-10-CM

## 2020-07-05 DIAGNOSIS — Z0000 Encounter for general adult medical examination without abnormal findings: Secondary | ICD-10-CM

## 2020-07-05 DIAGNOSIS — Z1231 Encounter for screening mammogram for malignant neoplasm of breast: Secondary | ICD-10-CM

## 2020-07-05 DIAGNOSIS — I10 Essential (primary) hypertension: Secondary | ICD-10-CM

## 2020-07-05 DIAGNOSIS — Z87442 Personal history of urinary calculi: Secondary | ICD-10-CM

## 2020-07-05 DIAGNOSIS — J4520 Mild intermittent asthma, uncomplicated: Secondary | ICD-10-CM

## 2020-07-05 DIAGNOSIS — Z833 Family history of diabetes mellitus: Secondary | ICD-10-CM

## 2020-07-05 DIAGNOSIS — R109 Unspecified abdominal pain: Secondary | ICD-10-CM

## 2020-07-05 NOTE — Telephone Encounter (Signed)
She has order for diagnostic mammogram.  Does this have to be scheduled or can she simply schedule herself for this?

## 2020-07-05 NOTE — Progress Notes (Signed)
Subjective:   HPI  Angel Morris is a 44 y.o. female who presents for a complete physical.  Medical team: [facility] for dentist Eye doctor Glade Lloyd, Camelia Eng, PA-C here for primary care  Concerns: She is still improving from her recent visit here for possible kidney stone versus urinary tract infection.  Not completely back to normal but improving  No recent problems with asthma  Needs biometric form completed, she does not have a form with her today but will bring it back in  Reviewed their medical, surgical, family, social, medication, and allergy history and updated chart as appropriate.  Past Medical History:  Diagnosis Date  . Abnormal pap    lSIL in the past  . Anemia, iron deficiency    in past, not as of 05/2016  . Anxiety   . Asthma    childhood  . Complication of anesthesia    States "when I had kidney stones removed(Open); I had a build up of fluid in my lungs. But I dont really remember much about it otherwise".  . Herpes 04/05/2016  . History of pyelonephritis   . History of recurrent UTIs   . Human papilloma virus   . Hypertension   . Menorrhagia   . Nephrolithiasis   . Overweight(278.02)   . Proteinuria   . Uterine fibroid   . Vaginal Pap smear, abnormal   . Wears contact lenses     Past Surgical History:  Procedure Laterality Date  . CERVICAL CONIZATION W/BX N/A 10/08/2013   Procedure: CONIZATION CERVIX WITH BIOPSY;  Surgeon: Florian Buff, MD;  Location: [facility];  Service: Gynecology;  Laterality: N/A;  . DILATION AND CURETTAGE OF UTERUS    . DILITATION & CURRETTAGE/HYSTROSCOPY WITH NOVASURE ABLATION N/A 07/02/2015   Procedure: DILATATION & CURETTAGE/HYSTEROSCOPY WITH NOVASURE ABLATION;  Surgeon: Florian Buff, MD;  Location: [facility];  Service: Gynecology;  Laterality: N/A;  Uterine Cavity Length 5.5 Uterine Cavity Width 3.5 Power 106 Time 1 minute 4 seconds  . HOLMIUM LASER APPLICATION N/A 11/18/5571   Procedure: HOLMIUM  LASER APPLICATION;  Surgeon: Florian Buff, MD;  Location: [facility];  Service: Gynecology;  Laterality: N/A;  . kidney stones  june 2000   open removal  . rotary cuff - left shoulder  2005     Family History  Problem Relation Age of Onset  . Fibromyalgia Mother   . Arthritis Mother        RA  . Hypertension Mother   . Diabetes Mother   . Diabetes Brother   . Hypertension Brother   . Diabetes Father        diabetes, amputation  . Heart disease Father 51       died of MI 02/02/17  . Kidney disease Father        dialysis  . Diabetes Son   . Stroke Maternal Grandmother   . Aneurysm Maternal Grandfather   . Stroke Maternal Grandfather   . Cancer Neg Hx      Current Outpatient Medications:  .  amLODipine (NORVASC) 10 MG tablet, Take 1 tablet (10 mg total) by mouth daily., Disp: 90 tablet, Rfl: 3 .  CVS D3 25 MCG (1000 UT) capsule, , Disp: , Rfl:  .  metroNIDAZOLE (FLAGYL) 500 MG tablet, Take 1 tablet (500 mg total) by mouth 2 (two) times daily., Disp: 14 tablet, Rfl: 0 .  Norgestimate-Ethinyl Estradiol Triphasic (TRI-PREVIFEM) 0.18/0.215/0.25 MG-35 MCG tablet, Take 1 tablet by mouth daily., Disp:  84 tablet, Rfl: 4 .  EPINEPHrine 0.3 mg/0.3 mL IJ SOAJ injection, Inject 0.3 mg into the muscle as needed for anaphylaxis. (Patient not taking: Reported on 06/24/2020), Disp: 1 each, Rfl: 0 .  sulfamethoxazole-trimethoprim (BACTRIM DS) 800-160 MG tablet, Take 1 tablet by mouth 2 (two) times daily. (Patient not taking: Reported on 07/05/2020), Disp: 14 tablet, Rfl: 0  Allergies  Allergen Reactions  . Shellfish Allergy Itching    With swelling      Review of Systems Constitutional: -fever, -chills, -sweats, -unexpected weight change, -decreased appetite, -fatigue Allergy: -sneezing, -itching, -congestion Dermatology: -changing moles, --rash, -lumps ENT: -runny nose, -ear pain, -sore throat, -hoarseness, -sinus pain, -teeth pain, - ringing in ears, -hearing loss, -nosebleeds Cardiology:  -chest pain, -palpitations, -swelling, -difficulty breathing when lying flat, -waking up short of breath Respiratory: -cough, -shortness of breath, -difficulty breathing with exercise or exertion, -wheezing, -coughing up blood Gastroenterology: +abdominal pain, -nausea, -vomiting, -diarrhea, -constipation, -blood in stool, -changes in bowel movement, -difficulty swallowing or eating Hematology: -bleeding, -bruising  Musculoskeletal: -joint aches, -muscle aches, -joint swelling, +back pain, -neck pain, -cramping, -changes in gait Ophthalmology: denies vision changes, eye redness, itching, discharge Urology: -burning with urination, -difficulty urinating, -blood in urine, -urinary frequency, -urgency, -incontinence Neurology: -headache, -weakness, -tingling, -numbness, -memory loss, -falls, -dizziness Psychology: -depressed mood, -agitation, -sleep problems, -SI/HI     Objective:   Physical Exam  BP 130/84   Pulse 71   Ht 5\' 4"  (1.626 m)   Wt 169 lb 6.4 oz (76.8 kg)   SpO2 97%   BMI 29.08 kg/m   Wt Readings from Last 3 Encounters:  07/05/20 169 lb 6.4 oz (76.8 kg)  06/24/20 168 lb 9.6 oz (76.5 kg)  06/21/20 167 lb 6.4 oz (75.9 kg)    General appearance: alert, no distress, WD/WN, AA female Skin: no worrisome findings, tattoo right forearm HEENT: normocephalic, conjunctiva/corneas normal, sclerae anicteric, PERRLA, EOMi, nares patent, no discharge or erythema, pharynx normal Oral cavity: MMM, tongue normal, teeth in good repair Neck: supple, no lymphadenopathy, no thyromegaly, no masses, normal ROM, no bruits Chest: non tender, normal shape and expansion Heart: RRR, normal S1, S2, no murmurs Lungs: CTA bilaterally, no wheezes, rhonchi, or rales Abdomen: +bs, soft, mild generalized tendnerss, otherwise non tender, non distended, no masses, no hepatomegaly, no splenomegaly, no bruits Back: non tender, normal ROM, no scoliosis Musculoskeletal: extremities non tender, no obvious  deformity, normal ROM throughout, lower extremities non tender, no obvious deformity, normal ROM throughout Extremities: no edema, no cyanosis, no clubbing Pulses: 2+ symmetric, upper and lower extremities, normal cap refill Neurological: alert, oriented x 3, CN2-12 intact, strength normal upper extremities and lower extremities, sensation normal throughout, DTRs 2+ throughout, no cerebellar signs, gait normal Psychiatric: normal affect, behavior normal, pleasant  Breast/gyn/rectal - deferred to gyn    Assessment and Plan :    Encounter Diagnoses  Name Primary?  . Encounter for screening mammogram for breast cancer Yes  . Routine general medical examination at a health care facility   . Essential hypertension   . Vitamin D deficiency   . Vaccine counseling   . Mild intermittent asthma without complication   . Abdominal pain, unspecified abdominal location   . Family history of diabetes mellitus   . History of pyelonephritis   . History of renal stone     Today you had a preventative care visit or wellness visit.    Topics today may have included healthy lifestyle, diet, exercise, preventative care, vaccinations, sick and well  care, proper use of emergency dept and after hours care, as well as other concerns.     Recommendations: Continue to return yearly for your annual wellness and preventative care visits.  This gives [place] a chance to discuss healthy lifestyle, exercise, vaccinations, review your chart record, and perform screenings where appropriate.  I recommend you see your eye doctor yearly for routine vision care.  I recommend you see your dentist yearly for routine dental care including hygiene visits twice yearly.  See your gynecologist yearly for routine gynecological care.    Vaccination recommendations were reviewed  She just recently had flu shot and is up to date on Covid vaccines, up to date on Td   Screening for cancer: Breast cancer screening: You should  perform a self breast exam monthly.   We reviewed recommendations for regular mammograms and breast cancer screening.  Colon cancer screening:  Due age 23  Cervical cancer screening: We reviewed recommendations for pap smear screening.  Sees gyn  Skin cancer screening: Check your skin regularly for new changes, growing lesions, or other lesions of concern Come in for evaluation if you have skin lesions of concern.  We currently don't have screenings for other cancers besides breast, cervical, colon, and lung cancers.  If you have a strong family history of cancer or have other cancer screening concerns, please let me know.    Bone health: Get at least 150 minutes of aerobic exercise weekly Get weight bearing exercise at least once weekly    Heart health: Get at least 150 minutes of aerobic exercise weekly Limit alcohol It is important to maintain a healthy blood pressure and healthy cholesterol numbers  I reviewed recent labs she had done at work through biometric screening.  Lipid numbers were fine.  Blood pressure was little elevated.  Glucose was normal.   Separate significant issues discussed: Abdominal pain, likely stone-improving, finish out medication, referral to urology  Vitamin D deficiency-continue supplement  Snoring, sleep disturbance, consider sleep study.  She will let me know if she wants to move forward with a sleep study  Asthma-no recent problems  Hypertension-continue current medication   Cambelle was seen today for annual exam.  Diagnoses and all orders for this visit:  Encounter for screening mammogram for breast cancer -     MM Digital Diagnostic Bilat  Routine general medical examination at a health care facility -     Hepatic function panel -     VITAMIN D 25 Hydroxy (Vit-D Deficiency, Fractures)  Essential hypertension  Vitamin D deficiency -     VITAMIN D 25 Hydroxy (Vit-D Deficiency, Fractures)  Vaccine counseling  Mild  intermittent asthma without complication  Abdominal pain, unspecified abdominal location  Family history of diabetes mellitus  History of pyelonephritis  History of renal stone    Follow-up pending labs, yearly for physical

## 2020-07-06 ENCOUNTER — Other Ambulatory Visit: Payer: Self-pay | Admitting: Medical

## 2020-07-06 DIAGNOSIS — I10 Essential (primary) hypertension: Secondary | ICD-10-CM

## 2020-07-06 DIAGNOSIS — T7840XA Allergy, unspecified, initial encounter: Secondary | ICD-10-CM

## 2020-07-06 LAB — HEPATIC FUNCTION PANEL
ALT: 9 IU/L (ref 0–32)
AST: 12 IU/L (ref 0–40)
Albumin: 4.1 g/dL (ref 3.8–4.8)
Alkaline Phosphatase: 56 IU/L (ref 44–121)
Bilirubin Total: 0.7 mg/dL (ref 0.0–1.2)
Bilirubin, Direct: 0.2 mg/dL (ref 0.00–0.40)
Total Protein: 7 g/dL (ref 6.0–8.5)

## 2020-07-06 LAB — VITAMIN D 25 HYDROXY (VIT D DEFICIENCY, FRACTURES): Vit D, 25-Hydroxy: 45.2 ng/mL (ref 30.0–100.0)

## 2020-07-06 MED ORDER — AMLODIPINE BESYLATE 10 MG PO TABS: 10.0000 mg | ORAL_TABLET | Freq: Every day | ORAL | 3 refills | Status: DC

## 2020-07-06 MED ORDER — EPINEPHRINE 0.3 MG/0.3ML IJ SOAJ: 0.3000 mg | INTRAMUSCULAR | 0 refills | Status: DC | PRN

## 2020-07-06 NOTE — Telephone Encounter (Signed)
Patient can schedule herself and the information for scheduling was provided to patient.

## 2020-07-14 ENCOUNTER — Telehealth: Payer: Self-pay | Admitting: Medical

## 2020-07-14 NOTE — Telephone Encounter (Signed)
Pt dropped off Biometric screening form that needs to be completed. Form will be placed in [employer]era's folder. She needs it Friday if possible

## 2020-07-16 NOTE — Telephone Encounter (Signed)
Called and left message that form was ready

## 2020-07-16 NOTE — Telephone Encounter (Signed)
Copy/scan and return form to patieint

## 2020-08-11 ENCOUNTER — Other Ambulatory Visit: Payer: BC Managed Care – PPO | Admitting: Adult Health

## 2020-08-25 ENCOUNTER — Other Ambulatory Visit: Payer: Self-pay | Admitting: Medical

## 2020-08-25 DIAGNOSIS — R921 Mammographic calcification found on diagnostic imaging of breast: Secondary | ICD-10-CM

## 2020-09-01 ENCOUNTER — Ambulatory Visit
Admission: RE | Admit: 2020-09-01 | Discharge: 2020-09-01 | Disposition: A | Discharge status: Home / Self Care | Payer: BC Managed Care – PPO | Source: Ambulatory Visit | Attending: Medical | Admitting: Medical

## 2020-09-01 ENCOUNTER — Other Ambulatory Visit: Payer: Self-pay

## 2020-09-01 DIAGNOSIS — R921 Mammographic calcification found on diagnostic imaging of breast: Secondary | ICD-10-CM | POA: Diagnosis not present

## 2020-09-06 ENCOUNTER — Other Ambulatory Visit: Payer: BC Managed Care – PPO

## 2020-09-06 DIAGNOSIS — Z20822 Contact with and (suspected) exposure to covid-19: Secondary | ICD-10-CM | POA: Diagnosis not present

## 2020-09-07 LAB — NOVEL CORONAVIRUS, NAA: SARS-CoV-2, NAA: NOT DETECTED

## 2020-09-07 LAB — SARS-COV-2, NAA 2 DAY TAT

## 2020-10-01 ENCOUNTER — Other Ambulatory Visit (HOSPITAL_COMMUNITY)
Admission: RE | Admit: 2020-10-01 | Discharge: 2020-10-01 | Disposition: A | Discharge status: Home / Self Care | Payer: BC Managed Care – PPO | Source: Ambulatory Visit | Attending: Adult Health | Admitting: Adult Health

## 2020-10-01 ENCOUNTER — Encounter: Payer: Self-pay | Admitting: Adult Health

## 2020-10-01 ENCOUNTER — Other Ambulatory Visit: Payer: BC Managed Care – PPO

## 2020-10-01 ENCOUNTER — Other Ambulatory Visit: Payer: Self-pay

## 2020-10-01 ENCOUNTER — Ambulatory Visit (INDEPENDENT_AMBULATORY_CARE_PROVIDER_SITE_OTHER): Payer: BC Managed Care – PPO | Admitting: Adult Health

## 2020-10-01 VITALS — BP 138/90 | HR 71 | Ht 64.5 in | Wt 168.0 lb

## 2020-10-01 DIAGNOSIS — Z124 Encounter for screening for malignant neoplasm of cervix: Secondary | ICD-10-CM | POA: Diagnosis not present

## 2020-10-01 DIAGNOSIS — Z0000 Encounter for general adult medical examination without abnormal findings: Secondary | ICD-10-CM | POA: Insufficient documentation

## 2020-10-01 DIAGNOSIS — N939 Abnormal uterine and vaginal bleeding, unspecified: Secondary | ICD-10-CM | POA: Insufficient documentation

## 2020-10-01 DIAGNOSIS — Z01419 Encounter for gynecological examination (general) (routine) without abnormal findings: Secondary | ICD-10-CM

## 2020-10-01 DIAGNOSIS — N898 Other specified noninflammatory disorders of vagina: Secondary | ICD-10-CM | POA: Diagnosis not present

## 2020-10-01 DIAGNOSIS — Z3041 Encounter for surveillance of contraceptive pills: Secondary | ICD-10-CM

## 2020-10-01 MED ORDER — NORETHINDRONE 0.35 MG PO TABS: 1.0000 | ORAL_TABLET | Freq: Every day | ORAL | 11 refills | Status: DC

## 2020-10-01 MED ORDER — DOXYCYCLINE HYCLATE 100 MG PO TABS: 100.0000 mg | ORAL_TABLET | Freq: Two times a day (BID) | ORAL | 0 refills | Status: DC

## 2020-10-01 MED ORDER — METRONIDAZOLE 500 MG PO TABS: 500.0000 mg | ORAL_TABLET | Freq: Two times a day (BID) | ORAL | 0 refills | Status: DC

## 2020-10-01 NOTE — Progress Notes (Signed)
Patient ID: TABOR BARTRAM, female   DOB: [birth year], 45 y.o.   MRN: [id number] History of Present Illness: Lezli is a 45 year old black female, divorced, [id number] in for well woman gyn exam and pap. PCP is Chana Bode PA.   Current Medications, Allergies, Past Medical History, Past Surgical History, Family History and Social History were reviewed in [employer] record.     Review of Systems:  Patient denies any headaches, hearing loss, fatigue, blurred vision, shortness of breath, chest pain, abdominal pain, problems with bowel movements, urination, or intercourse. No joint pain or mood swings. Had ablation and is now bleeding last 3-4 months, like period and has odor   Physical Exam:BP 138/90 (BP Location: Left Arm, Patient Position: Sitting, Cuff Size: Normal)   Pulse 71   Ht 5' 4.5" (1.638 m)   Wt 168 lb (76.2 kg)   LMP 09/30/2020   BMI 28.39 kg/m  General:  Well developed, well nourished, no acute distress Skin:  Warm and dry Neck:  Midline trachea, normal thyroid, good ROM, no lymphadenopathy Lungs; Clear to auscultation bilaterally Breast:  No dominant palpable mass, retraction, or nipple discharge Cardiovascular: Regular rate and rhythm Abdomen:  Soft, non tender, no hepatosplenomegaly Pelvic:  External genitalia is normal in appearance, no lesions.  The vagina is normal in appearance,has dark brown to black blood with odor. Urethra has no lesions or masses. The cervix is bulbous. Pap with GC/CHL and HRHPV genotyping performed. Uterus is felt to be normal size, shape, and contour.  No adnexal masses or tenderness noted.Bladder is non tender, no masses felt. Rectal: Good sphincter tone, no polyps, or hemorrhoids felt.  Hemoccult deferred due to vaginal  bleeding.  Extremities/musculoskeletal:  No swelling or varicosities noted, no clubbing or cyanosis Psych:  No mood changes, alert and cooperative,seems happy AA is 3 Fall risk is low PHQ 9 score is  11,no SI  GAD 7 score is 3  Upstream - 10/01/20 1047      Pregnancy Intention Screening   Does the patient want to become pregnant in the next year? N/A    Does the patient's partner want to become pregnant in the next year? N/A    Would the patient like to discuss contraceptive options today? N/A      Contraception Wrap Up   Current Method No Method - Other Reason   ablation   End Method No Method - Other Reason   ablation   Contraception Counseling Provided No         Examination chaperoned by [employer].   Impression and plan: 1. Encounter for gynecological examination with Papanicolaou smear of cervix Pap sent Physical in 1 year  Pap in 3 if normal   2. Routine medical exam Pap sent Physical in 1 year  3. Abnormal uterine bleeding (AUB) after ablation Will rx Micronor and doxycycline Meds ordered this encounter  Medications  . norethindrone (MICRONOR) 0.35 MG tablet    Sig: Take 1 tablet (0.35 mg total) by mouth daily.    Dispense:  28 tablet    Refill:  11    Order Specific Question:   Supervising Provider    Answer:   Elonda Husky, LUTHER H [[id number]]  . metroNIDAZOLE (FLAGYL) 500 MG tablet    Sig: Take 1 tablet (500 mg total) by mouth 2 (two) times daily.    Dispense:  14 tablet    Refill:  0    Order Specific Question:   Supervising Provider  Answer:   EURE, LUTHER H [[id number]]  . doxycycline (VIBRA-TABS) 100 MG tablet    Sig: Take 1 tablet (100 mg total) by mouth 2 (two) times daily.    Dispense:  20 tablet    Refill:  0    Order Specific Question:   Supervising Provider    Answer:   Tania Ade H [[id number]]  Follow up with me in 4 weeks   4. Vaginal odor Will rx flagyl   5. Encounter for surveillance of contraceptive pills Will rx Micronor

## 2020-10-06 LAB — CYTOLOGY - PAP
Adequacy: ABSENT
Chlamydia: NEGATIVE
Comment: NEGATIVE
Comment: NEGATIVE
Comment: NORMAL
Diagnosis: NEGATIVE
High risk HPV: NEGATIVE
Neisseria Gonorrhea: NEGATIVE

## 2020-10-11 ENCOUNTER — Telehealth: Payer: Self-pay

## 2020-10-11 ENCOUNTER — Other Ambulatory Visit: Payer: Self-pay | Admitting: Medical

## 2020-10-11 MED ORDER — MECLIZINE HCL 25 MG PO TABS: 25.0000 mg | ORAL_TABLET | Freq: Two times a day (BID) | ORAL | 0 refills | Status: DC

## 2020-10-11 NOTE — Telephone Encounter (Signed)
Pt. Called stating that she started having another episode of vertigo on Friday and still having symptoms today dizzy and light headed, she was going to take the meclizine you gave her for it but she needs [facility]ill on [facility] [place], if you could send that in for her or does she need to be seen.

## 2020-10-11 NOTE — Telephone Encounter (Signed)
I sent refill meclizine.  Obviously if worsening or not improving despite medication then recheck for other evaluation

## 2020-10-29 ENCOUNTER — Other Ambulatory Visit: Payer: Self-pay

## 2020-10-29 ENCOUNTER — Ambulatory Visit (INDEPENDENT_AMBULATORY_CARE_PROVIDER_SITE_OTHER): Payer: BC Managed Care – PPO | Admitting: Adult Health

## 2020-10-29 ENCOUNTER — Encounter: Payer: Self-pay | Admitting: Adult Health

## 2020-10-29 VITALS — BP 118/78 | HR 65 | Ht 64.0 in | Wt 170.0 lb

## 2020-10-29 DIAGNOSIS — N939 Abnormal uterine and vaginal bleeding, unspecified: Secondary | ICD-10-CM | POA: Diagnosis not present

## 2020-10-29 DIAGNOSIS — Z3041 Encounter for surveillance of contraceptive pills: Secondary | ICD-10-CM

## 2020-10-29 NOTE — Progress Notes (Signed)
  Subjective:     Patient ID: Angel Morris, female   DOB: 04/17/1976, 45 y.o.   MRN: [id number]  HPI Angel Morris is a 45 year old black female,divorced, [id number], back in follow up after taking doxycycline and flagyl and starting Micronor for AUB after ablation.The period was lighter, still had cramps. PCP is Chana Bode PA.   Review of Systems This last period was lighter but still had cramps Reviewed past medical,surgical, social and family history. Reviewed medications and allergies.     Objective:   Physical Exam BP 118/78 (BP Location: Left Arm, Patient Position: Sitting, Cuff Size: Normal)   Pulse 65   Ht 5\' 4"  (1.626 m)   Wt 170 lb (77.1 kg)   LMP 10/24/2020   BMI 29.18 kg/m    Skin warm and dry. Lungs: clear to ausculation bilaterally. Cardiovascular: regular rate and rhythm. Fall risk is low  Upstream - 10/29/20 1220      Pregnancy Intention Screening   Does the patient want to become pregnant in the next year? No    Does the patient's partner want to become pregnant in the next year? No    Would the patient like to discuss contraceptive options today? No      Contraception Wrap Up   Current Method No Method - Other Reason   ablation   End Method No Method - Other Reason   ablation   Contraception Counseling Provided No          Assessment:     1. Abnormal uterine bleeding (AUB) This period was lighter, but still had cramps Continue Micronor Follow up with me in 8 weeks  2. Encounter for surveillance of contraceptive pills Continue micronor, has refills    Plan:      Follow up in 8 weeks

## 2020-11-23 ENCOUNTER — Other Ambulatory Visit: Payer: Self-pay | Admitting: Medical

## 2020-11-24 NOTE — Telephone Encounter (Signed)
Inquiring with patient to see if she is still taking this medication.

## 2020-11-26 MED ORDER — VITAMIN D 25 MCG (1000 UNIT) PO TABS: ORAL_TABLET | ORAL | 0 refills | Status: DC

## 2020-11-26 NOTE — Addendum Note (Signed)
Addended by: Minette Headland A on: 11/26/2020 08:38 AM   Modules accepted: Orders

## 2020-11-26 NOTE — Telephone Encounter (Signed)
Re sent med.   

## 2020-12-24 ENCOUNTER — Other Ambulatory Visit: Payer: Self-pay

## 2020-12-24 ENCOUNTER — Encounter: Payer: Self-pay | Admitting: Adult Health

## 2020-12-24 ENCOUNTER — Ambulatory Visit (INDEPENDENT_AMBULATORY_CARE_PROVIDER_SITE_OTHER): Payer: BC Managed Care – PPO | Admitting: Adult Health

## 2020-12-24 VITALS — BP 142/84 | HR 51 | Ht 64.0 in | Wt 170.0 lb

## 2020-12-24 DIAGNOSIS — Z9889 Other specified postprocedural states: Secondary | ICD-10-CM | POA: Diagnosis not present

## 2020-12-24 DIAGNOSIS — N946 Dysmenorrhea, unspecified: Secondary | ICD-10-CM | POA: Diagnosis not present

## 2020-12-24 DIAGNOSIS — Z3041 Encounter for surveillance of contraceptive pills: Secondary | ICD-10-CM | POA: Diagnosis not present

## 2020-12-24 MED ORDER — IBUPROFEN 800 MG PO TABS: 800.0000 mg | ORAL_TABLET | Freq: Three times a day (TID) | ORAL | 1 refills | Status: AC | PRN

## 2020-12-24 NOTE — Progress Notes (Signed)
  Subjective:     Patient ID: Angel Morris, female   DOB: 1976/01/11, 45 y.o.   MRN: [id number]  HPI Angel Morris is a 45 year old black female, divorced, back in follow up on taking Micronor and periods are lighter, but has cramps, that can be bad, she has had ablation, but it did not stop her bleeding. PCP is Chana Bode PA.   Review of Systems Bleeding is lighter +mestrual cramping, can be bad Reviewed past medical,surgical, social and family history. Reviewed medications and allergies.     Objective:   Physical Exam BP (!) 142/84 (BP Location: Left Arm, Patient Position: Sitting, Cuff Size: Normal)   Pulse (!) 51   Ht 5\' 4"  (1.626 m)   Wt 170 lb (77.1 kg)   LMP 12/19/2020   BMI 29.18 kg/m  Skin warm and dry.  Lungs: clear to ausculation bilaterally. Cardiovascular: regular rate and rhythm.     Upstream - 12/24/20 0910      Pregnancy Intention Screening   Does the patient want to become pregnant in the next year? No    Does the patient's partner want to become pregnant in the next year? No    Would the patient like to discuss contraceptive options today? No      Contraception Wrap Up   Current Method Oral Contraceptive   ablation   End Method Oral Contraceptive   ablation   Contraception Counseling Provided No          Assessment:     1. Encounter for surveillance of contraceptive pills Continue Micronor, has refills  2. Menstrual cramps Will try motrin 800 mg for cramps Meds ordered this encounter  Medications  . ibuprofen (ADVIL) 800 MG tablet    Sig: Take 1 tablet (800 mg total) by mouth every 8 (eight) hours as needed.    Dispense:  60 tablet    Refill:  1    Order Specific Question:   Supervising Provider    Answer:   Elonda Husky, LUTHER H [[id number]]    3. S/P endometrial ablation, has bleeding Did discuss IUD as possible option     Plan:     Follow up in 8 weeks or sooner if needed

## 2021-02-21 ENCOUNTER — Ambulatory Visit: Payer: BC Managed Care – PPO | Admitting: Adult Health

## 2021-03-11 ENCOUNTER — Encounter: Payer: Self-pay | Admitting: Adult Health

## 2021-03-11 ENCOUNTER — Telehealth: Payer: Self-pay | Admitting: Medical

## 2021-03-11 ENCOUNTER — Ambulatory Visit (INDEPENDENT_AMBULATORY_CARE_PROVIDER_SITE_OTHER): Payer: BC Managed Care – PPO | Admitting: Adult Health

## 2021-03-11 ENCOUNTER — Other Ambulatory Visit: Payer: Self-pay

## 2021-03-11 VITALS — BP 127/82 | HR 54 | Ht 64.0 in | Wt 170.0 lb

## 2021-03-11 DIAGNOSIS — Z9889 Other specified postprocedural states: Secondary | ICD-10-CM

## 2021-03-11 DIAGNOSIS — Z3041 Encounter for surveillance of contraceptive pills: Secondary | ICD-10-CM | POA: Diagnosis not present

## 2021-03-11 DIAGNOSIS — N946 Dysmenorrhea, unspecified: Secondary | ICD-10-CM | POA: Diagnosis not present

## 2021-03-11 DIAGNOSIS — N939 Abnormal uterine and vaginal bleeding, unspecified: Secondary | ICD-10-CM

## 2021-03-11 NOTE — Progress Notes (Signed)
  Subjective:     Patient ID: Angel Morris, female   DOB: 09/21/1975, 45 y.o.   MRN: [id number]  HPI Angel Morris is a 45 year old black female, Divorced, [id number] back in follow up on taking Micronor and motrin 800 mg for bleeding sp ablation and cramping and bleeding is light now but still cramping. Lab Results  Component Value Date   DIAGPAP  10/01/2020    - Negative for intraepithelial lesion or malignancy (NILM)   HPV NOT DETECTED 02/18/2018   [place] Negative 10/01/2020   PCP is Chana Bode PA.  Review of Systems Bleeding after ablation, it is lighter now +cramping with period Reviewed past medical,surgical, social and family history. Reviewed medications and allergies.     Objective:   Physical Exam BP 127/82 (BP Location: Right Arm, Patient Position: Sitting, Cuff Size: Normal)   Pulse (!) 54   Ht 5\' 4"  (1.626 m)   Wt 170 lb (77.1 kg)   LMP 02/14/2021   BMI 29.18 kg/m  Skin warm and dry. Lungs: clear to ausculation bilaterally. Cardiovascular: regular rate and rhythm.     Fall risk is low  Upstream - 03/11/21 1210       Pregnancy Intention Screening   Does the patient want to become pregnant in the next year? No    Does the patient's partner want to become pregnant in the next year? No    Would the patient like to discuss contraceptive options today? No      Contraception Wrap Up   Current Method Oral Contraceptive    End Method Oral Contraceptive    Contraception Counseling Provided No             Assessment:     1. Encounter for surveillance of contraceptive pills Continue Micronor has refills   2. Menstrual cramps Continue Motrin 800 mg has refills   3. S/P endometrial ablation   4. Abnormal uterine bleeding (AUB) Discussed IUD again and gave handout on liletta     Plan:     Physical in 7 months  Call me if any questions or concerns

## 2021-03-11 NOTE — Telephone Encounter (Signed)
Pt called and states that she received a call from [facility] reminding her of appt next week. She also states that they are requesting records for our referral. Never received any information. Continuity of records faxed to [phone].

## 2021-03-15 DIAGNOSIS — Q625 Duplication of ureter: Secondary | ICD-10-CM | POA: Diagnosis not present

## 2021-03-15 DIAGNOSIS — N3020 Other chronic cystitis without hematuria: Secondary | ICD-10-CM | POA: Diagnosis not present

## 2021-03-15 DIAGNOSIS — N200 Calculus of kidney: Secondary | ICD-10-CM | POA: Diagnosis not present

## 2021-04-19 ENCOUNTER — Other Ambulatory Visit: Payer: Self-pay | Admitting: Adult Health

## 2021-04-19 MED ORDER — METRONIDAZOLE 500 MG PO TABS: 500.0000 mg | ORAL_TABLET | Freq: Two times a day (BID) | ORAL | 0 refills | Status: DC

## 2021-04-19 NOTE — Progress Notes (Signed)
Rx flagyl for BV

## 2021-06-16 ENCOUNTER — Ambulatory Visit (INDEPENDENT_AMBULATORY_CARE_PROVIDER_SITE_OTHER): Payer: BC Managed Care – PPO | Admitting: Medical

## 2021-06-16 ENCOUNTER — Other Ambulatory Visit: Payer: Self-pay

## 2021-06-16 VITALS — BP 124/80 | HR 66 | Wt 175.2 lb

## 2021-06-16 DIAGNOSIS — Z1211 Encounter for screening for malignant neoplasm of colon: Secondary | ICD-10-CM

## 2021-06-16 DIAGNOSIS — R12 Heartburn: Secondary | ICD-10-CM

## 2021-06-16 MED ORDER — OMEPRAZOLE 40 MG PO CPDR: 40.0000 mg | DELAYED_RELEASE_CAPSULE | Freq: Every day | ORAL | 1 refills | Status: DC

## 2021-06-16 NOTE — Progress Notes (Signed)
Subjective:  Angel Morris is a 45 y.o. female who presents for Chief Complaint  Patient presents with   Heartburn    Sick 2-3 weeks with back pain and cold symptoms. This past Friday, started having heartburn and nothing will help. Otc tums and pecid complete.      Here for heartburn.  She notes about 2 to 3 weeks with ongoing heartburn problems.  She has been using over-the-counter Tums without much improvement.  She has not been eating anything particularly spicy or acidic.  She does have some stress with 2 kids in college.  She is prescribed ibuprofen from gynecology for heavy bleeding and pain the first day of her menstrual cycle but has not used this in 3 weeks  No nausea, vomiting, diarrhea, constipation, no urinary issue, no injury, no chest pain or shortness of breath, no palpitations.  No other aggravating or relieving factors.    No other c/o.  Past Medical History:  Diagnosis Date   Abnormal pap    lSIL in the past   Anemia, iron deficiency    in past, not as of 05/2016   Anxiety    Asthma    childhood   Complication of anesthesia    States "when I had kidney stones removed(Open); I had a build up of fluid in my lungs. But I dont really remember much about it otherwise".   Herpes 04/05/2016   History of pyelonephritis    History of recurrent UTIs    Human papilloma virus    Hypertension    Menorrhagia    Nephrolithiasis    Overweight(278.02)    Proteinuria    Uterine fibroid    Vaginal Pap smear, abnormal    Wears contact lenses    Past Surgical History:  Procedure Laterality Date   CERVICAL CONIZATION W/BX N/A 10/08/2013   Procedure: CONIZATION CERVIX WITH BIOPSY;  Surgeon: Florian Buff, MD;  Location: [facility];  Service: Gynecology;  Laterality: N/A;   DILATION AND CURETTAGE OF UTERUS     DILITATION & CURRETTAGE/HYSTROSCOPY WITH NOVASURE ABLATION N/A 07/02/2015   Procedure: DILATATION & CURETTAGE/HYSTEROSCOPY WITH NOVASURE ABLATION;  Surgeon: Florian Buff,  MD;  Location: [facility];  Service: Gynecology;  Laterality: N/A;  Uterine Cavity Length 5.5 Uterine Cavity Width 3.5 Power 106 Time 1 minute 4 seconds   HOLMIUM LASER APPLICATION N/A 8/34/1962   Procedure: HOLMIUM LASER APPLICATION;  Surgeon: Florian Buff, MD;  Location: [facility];  Service: Gynecology;  Laterality: N/A;   kidney stones  june 2000   open removal   rotary cuff - left shoulder  2005     The following portions of the patient's history were reviewed and updated as appropriate: allergies, current medications, past family history, past medical history, past social history, past surgical history and problem list.  ROS Otherwise as in subjective above  Objective: BP 124/80   Pulse 66   Wt 175 lb 3.2 oz (79.5 kg)   BMI 30.07 kg/m   General appearance: alert, no distress, well developed, well nourished Oral cavity: MMM, no lesions Neck: supple, no lymphadenopathy, no thyromegaly, no masses Heart: RRR, normal S1, S2, no murmurs Lungs: CTA bilaterally, no wheezes, rhonchi, or rales Abdomen: +bs, soft, non tender, non distended, no masses, no hepatomegaly, no splenomegaly Pulses: 2+ radial pulses, 2+ pedal pulses, normal cap refill Ext: no edema   Assessment: Encounter Diagnoses  Name Primary?   Heartburn Yes   Screen for colon cancer  Plan: Of symptoms and concerns.  Avoid reflux triggers, avoid NSAIDs for now although she uses ibuprofen per gynecology for the first few days of her periods which can be painful and heavy  Begin omeprazole once daily for the next 2 weeks.  Discussed proper use of medication.  I gave her a GI cocktail today including diphenhydramine 12.5 mg, Maalox 10 mL, and oral viscous lidocaine 5 mL.  Referral to gastroenterology for screening  [name] was seen today for heartburn.  Diagnoses and all orders for this visit:  Heartburn -     Ambulatory referral to Gastroenterology  Screen for colon cancer -     Ambulatory referral to  Gastroenterology  Other orders -     omeprazole (PRILOSEC) 40 MG capsule; Take 1 capsule (40 mg total) by mouth daily.   Follow up: prn

## 2021-07-20 ENCOUNTER — Other Ambulatory Visit: Payer: Self-pay | Admitting: Medical

## 2021-07-20 ENCOUNTER — Other Ambulatory Visit: Payer: Self-pay | Admitting: Adult Health

## 2021-07-20 DIAGNOSIS — Z1231 Encounter for screening mammogram for malignant neoplasm of breast: Secondary | ICD-10-CM

## 2021-07-20 MED ORDER — METRONIDAZOLE 500 MG PO TABS: 500.0000 mg | ORAL_TABLET | Freq: Two times a day (BID) | ORAL | 0 refills | Status: DC

## 2021-07-20 NOTE — Progress Notes (Signed)
Refill flagyl  

## 2021-07-28 ENCOUNTER — Other Ambulatory Visit: Payer: Self-pay

## 2021-07-28 ENCOUNTER — Other Ambulatory Visit: Payer: Self-pay | Admitting: Medical

## 2021-07-28 ENCOUNTER — Ambulatory Visit (INDEPENDENT_AMBULATORY_CARE_PROVIDER_SITE_OTHER): Payer: BC Managed Care – PPO | Admitting: Medical

## 2021-07-28 VITALS — BP 110/70 | HR 55 | Ht 65.0 in | Wt 174.8 lb

## 2021-07-28 DIAGNOSIS — R5383 Other fatigue: Secondary | ICD-10-CM | POA: Diagnosis not present

## 2021-07-28 DIAGNOSIS — J4520 Mild intermittent asthma, uncomplicated: Secondary | ICD-10-CM

## 2021-07-28 DIAGNOSIS — Z87448 Personal history of other diseases of urinary system: Secondary | ICD-10-CM

## 2021-07-28 DIAGNOSIS — E559 Vitamin D deficiency, unspecified: Secondary | ICD-10-CM

## 2021-07-28 DIAGNOSIS — Z7185 Encounter for immunization safety counseling: Secondary | ICD-10-CM

## 2021-07-28 DIAGNOSIS — Z1211 Encounter for screening for malignant neoplasm of colon: Secondary | ICD-10-CM

## 2021-07-28 DIAGNOSIS — Z23 Encounter for immunization: Secondary | ICD-10-CM | POA: Diagnosis not present

## 2021-07-28 DIAGNOSIS — Z599 Problem related to housing and economic circumstances, unspecified: Secondary | ICD-10-CM

## 2021-07-28 DIAGNOSIS — I10 Essential (primary) hypertension: Secondary | ICD-10-CM

## 2021-07-28 DIAGNOSIS — Z0000 Encounter for general adult medical examination without abnormal findings: Secondary | ICD-10-CM

## 2021-07-28 DIAGNOSIS — Z1212 Encounter for screening for malignant neoplasm of rectum: Secondary | ICD-10-CM

## 2021-07-28 DIAGNOSIS — Z87442 Personal history of urinary calculi: Secondary | ICD-10-CM

## 2021-07-28 MED ORDER — AMLODIPINE BESYLATE 10 MG PO TABS: 10.0000 mg | ORAL_TABLET | Freq: Every day | ORAL | 3 refills | Status: DC

## 2021-07-28 MED ORDER — ZOLPIDEM TARTRATE 10 MG PO TABS: 10.0000 mg | ORAL_TABLET | Freq: Every evening | ORAL | 0 refills | Status: DC | PRN

## 2021-07-28 MED ORDER — OMEPRAZOLE 40 MG PO CPDR: 40.0000 mg | DELAYED_RELEASE_CAPSULE | Freq: Every day | ORAL | 3 refills | Status: DC

## 2021-07-28 NOTE — Progress Notes (Signed)
Subjective:   HPI  Angel Morris is a 45 y.o. female who presents for a complete physical.  Medical team: [facility] for dentist Eye doctor Dr. Margarette Canada, [facility] Merve Hotard, Camelia Eng, PA-C here for primary care   Concerns: Lately having problems sleeping.  She is under a lot of stress.  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  This has been a really stressful time with the economy the way it is.  She is having trouble getting to sleep.  She has used Ambien in the remote past and would like to have some help get a good night sleep occasionally.  She did not have any side effects of this medicine in the past.  She is having such a hard time as her mother is giving her about $200 a month to help.  She also has had to pay for some things for her kids expenses.  She has 2 children in college.  She thought about doing counseling through employee assistance program at work.  Otherwise normal state of health.  Compliant with medication for blood pressure.  No recent problems with asthma  Needs biometric form completed  Reviewed their medical, surgical, family, social, medication, and allergy history and updated chart as appropriate.  Past Medical History:  Diagnosis Date   Abnormal pap    lSIL in the past   Anemia, iron deficiency    in past, not as of 05/2016   Anxiety    Asthma    childhood   Complication of anesthesia    States "when I had kidney stones removed(Open); I had a build up of fluid in my lungs. But I dont really remember much about it otherwise".   Herpes 04/05/2016   History of pyelonephritis    History of recurrent UTIs    Human papilloma virus    Hypertension    Menorrhagia    Nephrolithiasis    Overweight(278.02)    Proteinuria    Uterine fibroid    Vaginal Pap smear, abnormal    Wears contact lenses     Past Surgical History:  Procedure Laterality Date   CERVICAL CONIZATION  W/BX N/A 10/08/2013   Procedure: CONIZATION CERVIX WITH BIOPSY;  Surgeon: Florian Buff, MD;  Location: [facility];  Service: Gynecology;  Laterality: N/A;   DILATION AND CURETTAGE OF UTERUS     DILITATION & CURRETTAGE/HYSTROSCOPY WITH NOVASURE ABLATION N/A 07/02/2015   Procedure: DILATATION & CURETTAGE/HYSTEROSCOPY WITH NOVASURE ABLATION;  Surgeon: Florian Buff, MD;  Location: [facility];  Service: Gynecology;  Laterality: N/A;  Uterine Cavity Length 5.5 Uterine Cavity Width 3.5 Power 106 Time 1 minute 4 seconds   HOLMIUM LASER APPLICATION N/A 10/26/6008   Procedure: HOLMIUM LASER APPLICATION;  Surgeon: Florian Buff, MD;  Location: [facility];  Service: Gynecology;  Laterality: N/A;   kidney stones  june 2000   open removal   rotary cuff - left shoulder  2005     Family History  Problem Relation Age of Onset   Fibromyalgia Mother    Arthritis Mother        RA   Hypertension Mother    Diabetes Mother    Diabetes Brother    Hypertension Brother    Diabetes Father        diabetes, amputation   Heart disease Father 33       died of MI 2017/01/11   Kidney disease Father  dialysis   Diabetes Son    Stroke Maternal Grandmother    Aneurysm Maternal Grandfather    Stroke Maternal Grandfather    Cancer Neg Hx      Current Outpatient Medications:    ibuprofen (ADVIL) 800 MG tablet, Take 1 tablet (800 mg total) by mouth every 8 (eight) hours as needed., Disp: 60 tablet, Rfl: 1   norethindrone (MICRONOR) 0.35 MG tablet, Take 1 tablet (0.35 mg total) by mouth daily., Disp: 28 tablet, Rfl: 11   zolpidem (AMBIEN) 10 MG tablet, Take 1 tablet (10 mg total) by mouth at bedtime as needed for sleep., Disp: 20 tablet, Rfl: 0   amLODipine (NORVASC) 10 MG tablet, Take 1 tablet (10 mg total) by mouth daily., Disp: 90 tablet, Rfl: 3   omeprazole (PRILOSEC) 40 MG capsule, Take 1 capsule (40 mg total) by mouth daily., Disp: 90 capsule, Rfl: 3  Allergies  Allergen Reactions   Shellfish Allergy Itching     With swelling      Review of Systems Constitutional: -fever, -chills, -sweats, -unexpected weight change, -decreased appetite, -fatigue Allergy: -sneezing, -itching, -congestion Dermatology: -changing moles, --rash, -lumps ENT: -runny nose, -ear pain, -sore throat, -hoarseness, -sinus pain, -teeth pain, - ringing in ears, -hearing loss, -nosebleeds Cardiology: -chest pain, -palpitations, -swelling, -difficulty breathing when lying flat, -waking up short of breath Respiratory: -cough, -shortness of breath, -difficulty breathing with exercise or exertion, -wheezing, -coughing up blood Gastroenterology: -abdominal pain, -nausea, -vomiting, -diarrhea, -constipation, -blood in stool, -changes in bowel movement, -difficulty swallowing or eating Hematology: -bleeding, -bruising  Musculoskeletal: -joint aches, -muscle aches, -joint swelling, -back pain, -neck pain, -cramping, -changes in gait Ophthalmology: denies vision changes, eye redness, itching, discharge Urology: -burning with urination, -difficulty urinating, -blood in urine, -urinary frequency, -urgency, -incontinence Neurology: -headache, -weakness, -tingling, -numbness, -memory loss, -falls, -dizziness Psychology: -depressed mood, -agitation, +sleep problems, -SI/HI       Objective:   Physical Exam  BP 110/70   Pulse (!) 55   Ht 5\' 5"  (1.651 m)   Wt 174 lb 12.8 oz (79.3 kg)   BMI 29.09 kg/m   Wt Readings from Last 3 Encounters:  07/28/21 174 lb 12.8 oz (79.3 kg)  06/16/21 175 lb 3.2 oz (79.5 kg)  03/11/21 170 lb (77.1 kg)    General appearance: alert, no distress, WD/WN, AA female Skin: no worrisome findings, tattoo right forearm HEENT: normocephalic, conjunctiva/corneas normal, sclerae anicteric, PERRLA, EOMi, nares patent, no discharge or erythema, pharynx normal Oral cavity: MMM, tongue normal, teeth in good repair Neck: supple, no lymphadenopathy, no thyromegaly, no masses, normal ROM, no bruits Chest: non tender,  normal shape and expansion Heart: RRR, normal S1, S2, no murmurs Lungs: CTA bilaterally, no wheezes, rhonchi, or rales Abdomen: +bs, soft, mild generalized tendnerss, otherwise non tender, non distended, no masses, no hepatomegaly, no splenomegaly, no bruits Back: non tender, normal ROM, no scoliosis Musculoskeletal: extremities non tender, no obvious deformity, normal ROM throughout, lower extremities non tender, no obvious deformity, normal ROM throughout Extremities: no edema, no cyanosis, no clubbing Pulses: 2+ symmetric, upper and lower extremities, normal cap refill Neurological: alert, oriented x 3, CN2-12 intact, strength normal upper extremities and lower extremities, sensation normal throughout, DTRs 2+ throughout, no cerebellar signs, gait normal Psychiatric: normal affect, behavior normal, pleasant  Breast/gyn/rectal - deferred to gyn    Assessment and Plan :    Encounter Diagnoses  Name Primary?   Routine general medical examination at a health care facility Yes   Screening for colorectal cancer  Vaccine counseling    Mild intermittent asthma without complication    History of pyelonephritis    History of renal stone    Essential hypertension    Vitamin D deficiency    Need for Tdap vaccination    Other fatigue    Financial difficulty      Today you had a preventative care visit or wellness visit.    Topics today may have included healthy lifestyle, diet, exercise, preventative care, vaccinations, sick and well care, proper use of emergency dept and after hours care, as well as other concerns.     Recommendations: Continue to return yearly for your annual wellness and preventative care visits.  This gives [place] a chance to discuss healthy lifestyle, exercise, vaccinations, review your chart record, and perform screenings where appropriate.  I recommend you see your eye doctor yearly for routine vision care.  I recommend you see your dentist yearly for routine dental  care including hygiene visits twice yearly.  See your gynecologist yearly for routine gynecological care.    Vaccination recommendations were reviewed Immunization History  Administered Date(s) Administered   H1N1 07/31/2008   Influenza Split 05/20/2021   Influenza,inj,Quad PF,6+ Mos 06/08/2016   Influenza-Unspecified 05/17/2017, 06/18/2019, 05/24/2020   PFIZER(Purple Top)SARS-COV-2 Vaccination 02/07/2020, 03/01/2020   Tdap 12/15/2010    Counseled on the Tdap (tetanus, diptheria, and acellular pertussis) vaccine.  Vaccine information sheet given. Tdap vaccine given after consent obtained.  (She is around a 50mo baby often).     She notes having flu shot at work 05/18/21    Screening for cancer: Breast cancer screening: You should perform a self breast exam monthly.   Continue routine mammograms  Colon cancer screening:  She has visit planned with GI for screening and possible EGD as well  Cervical cancer screening: We reviewed recommendations for pap smear screening.  Sees gyn  Skin cancer screening: Check your skin regularly for new changes, growing lesions, or other lesions of concern Come in for evaluation if you have skin lesions of concern.  We currently don't have screenings for other cancers besides breast, cervical, colon, and lung cancers.  If you have a strong family history of cancer or have other cancer screening concerns, please let me know.    Bone health: Get at least 150 minutes of aerobic exercise weekly Get weight bearing exercise at least once weekly    Heart health: Get at least 150 minutes of aerobic exercise weekly Limit alcohol It is important to maintain a healthy blood pressure and healthy cholesterol numbers  I reviewed recent labs she had done at work through biometric screening.  Lipid numbers were fine.  Blood pressure was little elevated.  Glucose was normal.   Separate significant issues discussed: Insomnia-begin trial of Ambien.   Discussed risk and benefits and proper use of medication.  Reduce stress when possible.  Financial stress -advised she seek a Development worker, community endocrine even for some advice.  We discussed general recommendations on helping improve her situation.  I gave her information on a local food bank and a resource for housing.  Asthma-no recent problems  Hypertension-continue current medication   Angel Morris was seen today for cpe.  Diagnoses and all orders for this visit:  Routine general medical examination at a health care facility -     Comprehensive metabolic panel -     CBC -     TSH -     VITAMIN D 25 Hydroxy (Vit-D Deficiency, Fractures) -  Urinalysis  Screening for colorectal cancer  Vaccine counseling  Mild intermittent asthma without complication  History of pyelonephritis  History of renal stone  Essential hypertension -     amLODipine (NORVASC) 10 MG tablet; Take 1 tablet (10 mg total) by mouth daily.  Vitamin D deficiency -     VITAMIN D 25 Hydroxy (Vit-D Deficiency, Fractures)  Need for Tdap vaccination  Other fatigue -     TSH  Financial difficulty  Other orders -     Tdap vaccine greater than or equal to 7yo IM -     zolpidem (AMBIEN) 10 MG tablet; Take 1 tablet (10 mg total) by mouth at bedtime as needed for sleep. -     omeprazole (PRILOSEC) 40 MG capsule; Take 1 capsule (40 mg total) by mouth daily.  Follow-up pending labs, yearly for physical

## 2021-07-28 NOTE — Patient Instructions (Addendum)
[employer] [phone]  Call about housing options   Consider financial counselor consult at a credit union   [facility] [phone] [email or web address] Address [place], [place] [address]   Market Hours Open to the Public Wednesday: 1:30-8:65HQ Friday: 11:00am-2:00pm Saturday: 9:00am-12:00pm

## 2021-07-29 ENCOUNTER — Other Ambulatory Visit: Payer: Self-pay | Admitting: Medical

## 2021-07-29 LAB — URINALYSIS
Bilirubin, UA: NEGATIVE
Glucose, UA: NEGATIVE
Nitrite, UA: NEGATIVE
Protein,UA: NEGATIVE
RBC, UA: NEGATIVE
Specific Gravity, UA: 1.019 (ref 1.005–1.030)
Urobilinogen, Ur: 1 mg/dL (ref 0.2–1.0)
pH, UA: 6 (ref 5.0–7.5)

## 2021-07-29 LAB — COMPREHENSIVE METABOLIC PANEL
ALT: 14 IU/L (ref 0–32)
AST: 18 IU/L (ref 0–40)
Albumin/Globulin Ratio: 1.7 (ref 1.2–2.2)
Albumin: 4.5 g/dL (ref 3.8–4.8)
Alkaline Phosphatase: 81 IU/L (ref 44–121)
BUN/Creatinine Ratio: 17 (ref 9–23)
BUN: 13 mg/dL (ref 6–24)
Bilirubin Total: 1 mg/dL (ref 0.0–1.2)
CO2: 22 mmol/L (ref 20–29)
Calcium: 9.1 mg/dL (ref 8.7–10.2)
Chloride: 100 mmol/L (ref 96–106)
Creatinine, Ser: 0.75 mg/dL (ref 0.57–1.00)
Globulin, Total: 2.6 g/dL (ref 1.5–4.5)
Glucose: 88 mg/dL (ref 70–99)
Potassium: 3.7 mmol/L (ref 3.5–5.2)
Sodium: 137 mmol/L (ref 134–144)
Total Protein: 7.1 g/dL (ref 6.0–8.5)
eGFR: 100 mL/min/{1.73_m2} (ref >59)

## 2021-07-29 LAB — CBC
Hematocrit: 36.4 % (ref 34.0–46.6)
Hemoglobin: 13 g/dL (ref 11.1–15.9)
MCH: 29.2 pg (ref 26.6–33.0)
MCHC: 35.7 g/dL (ref 31.5–35.7)
MCV: 82 fL (ref 79–97)
Platelets: 229 10*3/uL (ref 150–450)
RBC: 4.45 x10E6/uL (ref 3.77–5.28)
RDW: 13 % (ref 11.7–15.4)
WBC: 9 10*3/uL (ref 3.4–10.8)

## 2021-07-29 LAB — VITAMIN D 25 HYDROXY (VIT D DEFICIENCY, FRACTURES): Vit D, 25-Hydroxy: 25.2 ng/mL — ABNORMAL LOW (ref 30.0–100.0)

## 2021-07-29 LAB — TSH: TSH: 0.872 u[IU]/mL (ref 0.450–4.500)

## 2021-07-29 MED ORDER — VITAMIN D 25 MCG (1000 UNIT) PO TABS: 1000.0000 [IU] | ORAL_TABLET | Freq: Every day | ORAL | 3 refills | Status: DC

## 2021-07-29 MED ORDER — PANTOPRAZOLE SODIUM 40 MG PO TBEC: 40.0000 mg | DELAYED_RELEASE_TABLET | Freq: Every day | ORAL | 1 refills | Status: DC

## 2021-08-04 ENCOUNTER — Encounter: Payer: Self-pay | Admitting: Gastroenterology

## 2021-08-30 ENCOUNTER — Encounter: Payer: Self-pay | Admitting: Gastroenterology

## 2021-08-30 ENCOUNTER — Ambulatory Visit (INDEPENDENT_AMBULATORY_CARE_PROVIDER_SITE_OTHER): Payer: BC Managed Care – PPO | Admitting: Gastroenterology

## 2021-08-30 VITALS — BP 124/62 | HR 68 | Ht 65.0 in | Wt 174.4 lb

## 2021-08-30 DIAGNOSIS — Z1211 Encounter for screening for malignant neoplasm of colon: Secondary | ICD-10-CM | POA: Diagnosis not present

## 2021-08-30 DIAGNOSIS — Z1212 Encounter for screening for malignant neoplasm of rectum: Secondary | ICD-10-CM

## 2021-08-30 DIAGNOSIS — K219 Gastro-esophageal reflux disease without esophagitis: Secondary | ICD-10-CM | POA: Diagnosis not present

## 2021-08-30 MED ORDER — PANTOPRAZOLE SODIUM 40 MG PO TBEC: 40.0000 mg | DELAYED_RELEASE_TABLET | Freq: Every day | ORAL | 3 refills | Status: DC

## 2021-08-30 MED ORDER — SUTAB 1479-225-188 MG PO TABS: 1.0000 | ORAL_TABLET | Freq: Once | ORAL | 0 refills | Status: AC

## 2021-08-30 NOTE — Patient Instructions (Signed)
If you are age 46 or older, your body mass index should be between 23-30. Your Body mass index is 29.02 kg/m. If this is out of the aforementioned range listed, please consider follow up with your Primary Care Provider.  If you are age 82 or younger, your body mass index should be between 19-25. Your Body mass index is 29.02 kg/m. If this is out of the aformentioned range listed, please consider follow up with your Primary Care Provider.   You have been scheduled for a colonoscopy. Please follow written instructions given to you at your visit today.  Please pick up your prep supplies at the pharmacy within the next 1-3 days. If you use inhalers (even only as needed), please bring them with you on the day of your procedure.  We have sent the following medications to your pharmacy for you to pick up at your convenience: Protonix 40 mg once daily.  The [place] [facility] providers would like to encourage you to use [facility] to communicate with providers for non-urgent requests or questions.  Due to long hold times on the telephone, sending your provider a message by [facility] may be a faster and more efficient way to get a response.  Please allow 48 business hours for a response.  Please remember that this is for non-urgent requests.   It was a pleasure to see you today!  Thank you for trusting me with your gastrointestinal care!    Scott E.Candis Schatz, MD

## 2021-08-30 NOTE — Progress Notes (Signed)
HPI : Angel Morris is a very pleasant 46 year old female referred to [place] by Chana Bode for further evaluation and treatment of GERD symptoms and for initial screening colonoscopy.  Patient states that she has had GERD symptoms for many years, and was on medications for a while which did help control her symptoms, but her insurance stopped covering the medications and so she stopped taking them.  She did okay without medications until around August or September of this year, when her symptoms seem to worsen significantly.  She denied any changes in her diet that would explain the change in symptoms.  Similarly no other changes in life style or circumstances.  She has gained between 5 to 10 pounds in the last year.  She is currently taking over-the-counter omeprazole a few days a week.  She says this has helped. She describes her symptoms as a burning sensation in the chest as well as the sensation of fluid and fullness in her chest.  This is often accompanied by nausea and sometimes retching.  She sometimes wakes up at night with heartburn, but denies episodes of choking on stomach acid or fluid.  No problems with dysphagia.  No significant epigastric pain.  Her symptoms do not seem to vary much with her diet.  She has tried fasting, but this did not seem to improve her GERD.  She stopped eating after about 5:00, but this did not help. She has regular bowel movements, with formed brown stools daily.  She denies problems with abdominal pain, constipation, diarrhea or blood in her stool.  She has no family history of any GI malignancy.   Past Medical History:  Diagnosis Date   Abnormal pap    lSIL in the past   Anemia, iron deficiency    in past, not as of 05/2016   Anxiety    Asthma    childhood   Complication of anesthesia    States "when I had kidney stones removed(Open); I had a build up of fluid in my lungs. But I dont really remember much about it otherwise".   Herpes 04/05/2016   History  of pyelonephritis    History of recurrent UTIs    Human papilloma virus    Hypertension    Menorrhagia    Nephrolithiasis    Overweight(278.02)    Proteinuria    Uterine fibroid    Vaginal Pap smear, abnormal    Wears contact lenses      Past Surgical History:  Procedure Laterality Date   CERVICAL CONIZATION W/BX N/A 10/08/2013   Procedure: CONIZATION CERVIX WITH BIOPSY;  Surgeon: Florian Buff, MD;  Location: [facility];  Service: Gynecology;  Laterality: N/A;   DILATION AND CURETTAGE OF UTERUS     DILITATION & CURRETTAGE/HYSTROSCOPY WITH NOVASURE ABLATION N/A 07/02/2015   Procedure: DILATATION & CURETTAGE/HYSTEROSCOPY WITH NOVASURE ABLATION;  Surgeon: Florian Buff, MD;  Location: [facility];  Service: Gynecology;  Laterality: N/A;  Uterine Cavity Length 5.5 Uterine Cavity Width 3.5 Power 106 Time 1 minute 4 seconds   HOLMIUM LASER APPLICATION N/A 02/17/6332   Procedure: HOLMIUM LASER APPLICATION;  Surgeon: Florian Buff, MD;  Location: [facility];  Service: Gynecology;  Laterality: N/A;   kidney stones  june 2000   open removal   rotary cuff - left shoulder  2005   Family History  Problem Relation Age of Onset   Fibromyalgia Mother    Arthritis Mother        RA   Hypertension Mother  Diabetes Mother    Diabetes Brother    Hypertension Brother    Diabetes Father        diabetes, amputation   Heart disease Father 54       died of MI Jan 18, 2017   Kidney disease Father        dialysis   Diabetes Son    Stroke Maternal Grandmother    Aneurysm Maternal Grandfather    Stroke Maternal Grandfather    Cancer Neg Hx    Social History   Tobacco Use   Smoking status: Never   Smokeless tobacco: Never  Vaping Use   Vaping Use: Never used  Substance Use Topics   Alcohol use: Yes    Comment: rare   Drug use: No   Current Outpatient Medications  Medication Sig Dispense Refill   amLODipine (NORVASC) 10 MG tablet Take 1 tablet (10 mg total) by mouth daily. 90 tablet 3    cholecalciferol (VITAMIN D3) 25 MCG (1000 UNIT) tablet Take 1 tablet (1,000 Units total) by mouth daily. 90 tablet 3   ibuprofen (ADVIL) 800 MG tablet Take 1 tablet (800 mg total) by mouth every 8 (eight) hours as needed. 60 tablet 1   norethindrone (MICRONOR) 0.35 MG tablet Take 1 tablet (0.35 mg total) by mouth daily. 28 tablet 11   zolpidem (AMBIEN) 10 MG tablet Take 1 tablet (10 mg total) by mouth at bedtime as needed for sleep. 20 tablet 0   No current facility-administered medications for this visit.   Allergies  Allergen Reactions   Shellfish Allergy Itching    With swelling      Review of Systems: All systems reviewed and negative except where noted in HPI.    No results found.  Physical Exam: BP 124/62    Pulse 68    Ht 5\' 5"  (1.651 m)    Wt 174 lb 6.4 oz (79.1 kg)    BMI 29.02 kg/m  Constitutional: Pleasant,well-developed, African-American female in no acute distress. HEENT: Normocephalic and atraumatic. Conjunctivae are normal. No scleral icterus. Cardiovascular: Normal rate, regular rhythm.  Pulmonary/chest: Effort normal and breath sounds normal. No wheezing, rales or rhonchi. Abdominal: Soft, nondistended, nontender. Bowel sounds active throughout. There are no masses palpable. No hepatomegaly. Extremities: no edema Lymphadenopathy: No cervical adenopathy noted. Neurological: Alert and oriented to person place and time. Skin: Skin is warm and dry. No rashes noted. Psychiatric: Normal mood and affect. Behavior is normal.  CBC    Component Value Date/Time   WBC 9.0 07/28/2021 1555   WBC 11.4 (H) 08/27/2017 1229   RBC 4.45 07/28/2021 1555   RBC 4.82 08/27/2017 1229   HGB 13.0 07/28/2021 1555   HCT 36.4 07/28/2021 1555   PLT 229 07/28/2021 1555   MCV 82 07/28/2021 1555   MCH 29.2 07/28/2021 1555   MCH 27.6 08/27/2017 1229   MCHC 35.7 07/28/2021 1555   MCHC 35.0 08/27/2017 1229   RDW 13.0 07/28/2021 1555   LYMPHSABS 2.2 06/21/2020 1427   MONOABS 1.1 (H)  05/13/2016 0947   EOSABS 0.1 06/21/2020 1427   BASOSABS 0.0 06/21/2020 1427    CMP     Component Value Date/Time   NA 137 07/28/2021 1555   K 3.7 07/28/2021 1555   CL 100 07/28/2021 1555   CO2 22 07/28/2021 1555   GLUCOSE 88 07/28/2021 1555   GLUCOSE 109 (H) 06/15/2017 0953   BUN 13 07/28/2021 1555   CREATININE 0.75 07/28/2021 1555   CREATININE 0.77 06/15/2017 0953   CALCIUM  9.1 07/28/2021 1555   PROT 7.1 07/28/2021 1555   ALBUMIN 4.5 07/28/2021 1555   AST 18 07/28/2021 1555   ALT 14 07/28/2021 1555   ALKPHOS 81 07/28/2021 1555   BILITOT 1.0 07/28/2021 1555   GFRNONAA 89 06/21/2020 1427   GFRAA 102 06/21/2020 1427     ASSESSMENT AND PLAN: 46 year old female with typical GERD symptoms, currently on a daily basis.  She is taking over-the-counter omeprazole, but taking it a few days a week due to cost.  We discussed the pathophysiology of GERD and the principles of GERD management to include lifestyle modifications  such as dietary discretion (avoidance of alcohol, tobacco, caffeinated and carbonated beverages, spicy/greasy foods, citrus, peppermint/chocolate), weight loss if applicable, head of bed elevation andconsuming last meal of day within 3 hours of bedtime; pharmacologic options to include PPIs, H2RAs and OTC antacids; and finally surgical or endoscopic fundoplication. Patient would likely benefit from a higher dose daily PPI.  I recommended she start taking Protonix 40 mg once daily 30 to 45 minutes before breakfast.  If this is not adequately controlling her symptoms, I would increase this to twice daily dosing.  No indication for upper endoscopy at this time given her lack of Barrett's risk factors, and her history of typical GERD symptoms responsive to PPI. We will schedule patient for routine initial average risk screening colonoscopy.  GERD - Protonix 40 mg PO daily  CRC screening colonoscopy - Screening colonoscopy  The details, risks (including bleeding,  perforation, infection, missed lesions, medication reactions and possible hospitalization or surgery if complications occur), benefits, and alternatives to colonoscopy with possible biopsy and possible polypectomy were discussed with the patient and she consents to proceed.   Angel Morris E. Candis Schatz, MD [facility]   CC:  Glade Lloyd Camelia Eng, PA-C

## 2021-09-02 ENCOUNTER — Ambulatory Visit: Payer: BC Managed Care – PPO

## 2021-09-04 ENCOUNTER — Other Ambulatory Visit: Payer: Self-pay | Admitting: Adult Health

## 2021-09-08 ENCOUNTER — Encounter: Payer: Self-pay | Admitting: Family Medicine

## 2021-09-08 ENCOUNTER — Telehealth (INDEPENDENT_AMBULATORY_CARE_PROVIDER_SITE_OTHER): Payer: BC Managed Care – PPO | Admitting: Family Medicine

## 2021-09-08 VITALS — Ht 65.0 in | Wt 174.0 lb

## 2021-09-08 DIAGNOSIS — J019 Acute sinusitis, unspecified: Secondary | ICD-10-CM | POA: Diagnosis not present

## 2021-09-08 DIAGNOSIS — I10 Essential (primary) hypertension: Secondary | ICD-10-CM | POA: Diagnosis not present

## 2021-09-08 MED ORDER — AMOXICILLIN-POT CLAVULANATE 875-125 MG PO TABS: 1.0000 | ORAL_TABLET | Freq: Two times a day (BID) | ORAL | 0 refills | Status: DC

## 2021-09-08 NOTE — Progress Notes (Signed)
Start time: 4:29 End time: 4:48  Virtual Visit via Video Note  I connected with Angel Morris on 09/08/21 by a video enabled telemedicine application and verified that I am speaking with the correct person using two identifiers.  Location: Patient: home, kids in room Provider: office   I discussed the limitations of evaluation and management by telemedicine and the availability of in person appointments. The patient expressed understanding and agreed to proceed.  History of Present Illness:  Chief Complaint  Patient presents with   Cough    VIRTUAL cough and congestion. Started last Tues and she woke up with itchy throat. Had fever when symptoms first started but now gone. Mucus is dark yellow in color. Took covid test 2 days ago and it was negative.    9 days ago she woke up with itchy, sore throat.  She had subjective fever, worsening congestion, cough.  No body aches. Wednesday was the worst. Sore throat improved after the first few days. She feels better, but has persistent congestion. She is not as tired. Blowing nose a lot still, dark yellow mucus (noticed color starting yesterday, had been clear initially). Cough is productive in the morning, yellowish.   Not coughing up phlegm during the day. Denies headache or sinus pain.  Head feels full.  Initially she took theraflu, then switched to dayquil. Dayquil during the day, and alka selzer at night. (Aspirin, DM, doxylamine, phenylephrine) Lemon ginger tea with honey. Cough is manageable/improved.   Exposed to sick 15 yo granddaughter around Christmas   PMH, [place], [place] reviewed  HTN, not monitoring blood pressure. Has a cuff at home.  Outpatient Encounter Medications as of 09/08/2021  Medication Sig Note   amLODipine (NORVASC) 10 MG tablet Take 1 tablet (10 mg total) by mouth daily.    cholecalciferol (VITAMIN D3) 25 MCG (1000 UNIT) tablet Take 1 tablet (1,000 Units total) by mouth daily.    norethindrone (MICRONOR)  0.35 MG tablet TAKE 1 TABLET BY MOUTH EVERY DAY    pantoprazole (PROTONIX) 40 MG tablet Take 1 tablet (40 mg total) by mouth daily.    Phenyleph-CPM-DM-APAP (ALKA-SELTZER PLUS COLD & FLU) 12-27-08-250 MG TBEF Take 2 each by mouth as needed. 09/08/2021: Last dose last night   Pseudoephedrine-APAP-DM (DAYQUIL PO) Take 2 capsules by mouth as needed. 09/08/2021: Last dose 6:30 am   ibuprofen (ADVIL) 800 MG tablet Take 1 tablet (800 mg total) by mouth every 8 (eight) hours as needed. (Patient not taking: Reported on 09/08/2021) 09/08/2021: As needed   zolpidem (AMBIEN) 10 MG tablet Take 1 tablet (10 mg total) by mouth at bedtime as needed for sleep. (Patient not taking: Reported on 09/08/2021) 09/08/2021: As needed   No facility-administered encounter medications on file as of 09/08/2021.    Allergies  Allergen Reactions   Shellfish Allergy Itching    With swelling    ROS:  URI symptoms per HPI.  No further fever, energy improving. Persistent cough, purulent drainage, congestion.  Denies chest pain, shortness of breath or any other symptoms.   Observations/Objective:  Ht 5\' 5"  (1.651 m)    Wt 174 lb (78.9 kg)    BMI 28.96 kg/m  Well-appearing, pleasant female, who sounds congested.  She is not coughing. She is speaking easily and is in no distress Cranial nerves are grossly intact. She is in good spirits. Exam is limited due to virtual nature of the visit.  Assessment and Plan:  Acute non-recurrent sinusitis, unspecified location - Plan: amoxicillin-clavulanate (AUGMENTIN) 875-125 MG  tablet  Essential hypertension - has been very well controlled.  Using decongestants which can raise BP.  To monitor--okay to continue if remains low. Switch to Coricidin if 140/85 or higher  URI, with secondary bacterial infection (sinusitis).  Will use augmentin due to being around her 5yo grandchild (with amox use).     Follow Up Instructions:    I discussed the assessment and treatment plan with the  patient. The patient was provided an opportunity to ask questions and all were answered. The patient agreed with the plan and demonstrated an understanding of the instructions.   The patient was advised to call back or seek an in-person evaluation if the symptoms worsen or if the condition fails to improve as anticipated.  I spent 22 minutes dedicated to the care of this patient, including pre-visit review of records, face to face time, post-visit ordering of testing and documentation.    Vikki Ports, MD

## 2021-09-08 NOTE — Patient Instructions (Addendum)
Stay well hydrated, drink plenty of water.  Monitor your blood pressure while on dayquil and Alka Selzer, as both of these contain phenylephrine, which is a decongestant that can raise your blood pressure. If your blood pressure stays under 140/85, you can continue to use it. If it is higher, please stop. You can switch to Coricidin HBP if your blood pressure is too high.  If your blood pressure is okay, you can continue both of the over-the-counter medications you have been taking, but  monitor the blood pressure daily. I don't believe that these contain guaifenesin, which is an expectorant that will keep the mucus/phlegm.  You can add this ingredient by taking a plain mucinex (NOT the D or DM).  Call us next week if symptoms aren't improving or if they get worse.  I hope you feel better soon!

## 2021-10-21 ENCOUNTER — Ambulatory Visit (AMBULATORY_SURGERY_CENTER): Payer: BC Managed Care – PPO | Admitting: Gastroenterology

## 2021-10-21 ENCOUNTER — Encounter: Payer: Self-pay | Admitting: Gastroenterology

## 2021-10-21 ENCOUNTER — Other Ambulatory Visit: Payer: Self-pay

## 2021-10-21 VITALS — BP 136/79 | HR 48 | Temp 98.0°F | Resp 15 | Ht 65.0 in | Wt 174.0 lb

## 2021-10-21 DIAGNOSIS — D122 Benign neoplasm of ascending colon: Secondary | ICD-10-CM | POA: Diagnosis not present

## 2021-10-21 DIAGNOSIS — Z1212 Encounter for screening for malignant neoplasm of rectum: Secondary | ICD-10-CM | POA: Diagnosis not present

## 2021-10-21 DIAGNOSIS — Z1211 Encounter for screening for malignant neoplasm of colon: Secondary | ICD-10-CM | POA: Diagnosis not present

## 2021-10-21 DIAGNOSIS — K219 Gastro-esophageal reflux disease without esophagitis: Secondary | ICD-10-CM

## 2021-10-21 MED ORDER — SODIUM CHLORIDE 0.9 % IV SOLN: 500.0000 mL | INTRAVENOUS | Status: DC

## 2021-10-21 NOTE — Progress Notes (Signed)
Called to room to assist during endoscopic procedure.  Patient ID and intended procedure confirmed with present staff. Received instructions for my participation in the procedure from the performing physician.  

## 2021-10-21 NOTE — Progress Notes (Signed)
[facility] History and Physical   Primary Care Physician:  Carlena Hurl, PA-C   Reason for Procedure:   Colon cancer screening  Plan:    Screening colonoscopy     HPI: Angel Morris is a 46 y.o. female undergoing initial average risk screening colonoscopy.  She has no family history of colon cancer and no chronic lower GI symptoms.    Past Medical History:  Diagnosis Date   Abnormal pap    lSIL in the past   Anemia, iron deficiency    in past, not as of 05/2016   Anxiety    Asthma    childhood   Complication of anesthesia    States "when I had kidney stones removed(Open); I had a build up of fluid in my lungs. But I dont really remember much about it otherwise".   Herpes 04/05/2016   History of pyelonephritis    History of recurrent UTIs    Human papilloma virus    Hypertension    Menorrhagia    Nephrolithiasis    Overweight(278.02)    Proteinuria    Uterine fibroid    Vaginal Pap smear, abnormal    Wears contact lenses     Past Surgical History:  Procedure Laterality Date   CERVICAL CONIZATION W/BX N/A 10/08/2013   Procedure: CONIZATION CERVIX WITH BIOPSY;  Surgeon: Florian Buff, MD;  Location: [facility];  Service: Gynecology;  Laterality: N/A;   DILATION AND CURETTAGE OF UTERUS     DILITATION & CURRETTAGE/HYSTROSCOPY WITH NOVASURE ABLATION N/A 07/02/2015   Procedure: DILATATION & CURETTAGE/HYSTEROSCOPY WITH NOVASURE ABLATION;  Surgeon: Florian Buff, MD;  Location: [facility];  Service: Gynecology;  Laterality: N/A;  Uterine Cavity Length 5.5 Uterine Cavity Width 3.5 Power 106 Time 1 minute 4 seconds   HOLMIUM LASER APPLICATION N/A 1/61/0960   Procedure: HOLMIUM LASER APPLICATION;  Surgeon: Florian Buff, MD;  Location: [facility];  Service: Gynecology;  Laterality: N/A;   kidney stones  june 2000   open removal   rotary cuff - left shoulder  2005    Prior to Admission medications   Medication Sig Start Date End Date Taking? Authorizing Provider   amLODipine (NORVASC) 10 MG tablet Take 1 tablet (10 mg total) by mouth daily. 07/28/21  Yes Tysinger, Camelia Eng, PA-C  cholecalciferol (VITAMIN D3) 25 MCG (1000 UNIT) tablet Take 1 tablet (1,000 Units total) by mouth daily. 07/29/21  Yes Tysinger, Camelia Eng, PA-C  norethindrone (MICRONOR) 0.35 MG tablet TAKE 1 TABLET BY MOUTH EVERY DAY 09/05/21  Yes Derrek Monaco A, NP  pantoprazole (PROTONIX) 40 MG tablet Take 1 tablet (40 mg total) by mouth daily. 08/30/21  Yes Daryel November, MD  amoxicillin-clavulanate (AUGMENTIN) 875-125 MG tablet Take 1 tablet by mouth 2 (two) times daily. Patient not taking: Reported on 10/21/2021 09/08/21   Rita Ohara, MD  ibuprofen (ADVIL) 800 MG tablet Take 1 tablet (800 mg total) by mouth every 8 (eight) hours as needed. Patient not taking: Reported on 09/08/2021 12/24/20   Estill Dooms, NP  Phenyleph-CPM-DM-APAP (ALKA-SELTZER PLUS COLD & FLU) 12-27-08-250 MG TBEF Take 2 each by mouth as needed. Patient not taking: Reported on 10/21/2021    [provider]  Pseudoephedrine-APAP-DM (DAYQUIL PO) Take 2 capsules by mouth as needed. Patient not taking: Reported on 10/21/2021    [provider]  zolpidem (AMBIEN) 10 MG tablet Take 1 tablet (10 mg total) by mouth at bedtime as needed for sleep. Patient not taking: Reported on  09/08/2021 07/28/21 10/11/21  Tysinger, Camelia Eng, PA-C    Current Outpatient Medications  Medication Sig Dispense Refill   amLODipine (NORVASC) 10 MG tablet Take 1 tablet (10 mg total) by mouth daily. 90 tablet 3   cholecalciferol (VITAMIN D3) 25 MCG (1000 UNIT) tablet Take 1 tablet (1,000 Units total) by mouth daily. 90 tablet 3   norethindrone (MICRONOR) 0.35 MG tablet TAKE 1 TABLET BY MOUTH EVERY DAY 84 tablet 3   pantoprazole (PROTONIX) 40 MG tablet Take 1 tablet (40 mg total) by mouth daily. 90 tablet 3   amoxicillin-clavulanate (AUGMENTIN) 875-125 MG tablet Take 1 tablet by mouth 2 (two) times daily. (Patient not taking: Reported  on 10/21/2021) 20 tablet 0   ibuprofen (ADVIL) 800 MG tablet Take 1 tablet (800 mg total) by mouth every 8 (eight) hours as needed. (Patient not taking: Reported on 09/08/2021) 60 tablet 1   Phenyleph-CPM-DM-APAP (ALKA-SELTZER PLUS COLD & FLU) 12-27-08-250 MG TBEF Take 2 each by mouth as needed. (Patient not taking: Reported on 10/21/2021)     Pseudoephedrine-APAP-DM (DAYQUIL PO) Take 2 capsules by mouth as needed. (Patient not taking: Reported on 10/21/2021)     zolpidem (AMBIEN) 10 MG tablet Take 1 tablet (10 mg total) by mouth at bedtime as needed for sleep. (Patient not taking: Reported on 09/08/2021) 20 tablet 0   Current Facility-Administered Medications  Medication Dose Route Frequency Provider Last Rate Last Admin   0.9 %  sodium chloride infusion  500 mL Intravenous Continuous Daryel November, MD        Allergies as of 10/21/2021 - Review Complete 10/21/2021  Allergen Reaction Noted   Shellfish allergy Itching 07/05/2020    Family History  Problem Relation Age of Onset   Fibromyalgia Mother    Arthritis Mother        RA   Hypertension Mother    Diabetes Mother    Diabetes Father        diabetes, amputation   Heart disease Father 79       died of MI 01/30/2017   Kidney disease Father        dialysis   Diabetes Brother    Hypertension Brother    Stroke Maternal Grandmother    Aneurysm Maternal Grandfather    Stroke Maternal Grandfather    Diabetes Son    Cancer Neg Hx    Colon polyps Neg Hx    Rectal cancer Neg Hx    Stomach cancer Neg Hx     Social History   Socioeconomic History   Marital status: Divorced    Spouse name: Not on file   Number of children: Not on file   Years of education: Not on file   Highest education level: Not on file  Occupational History   Not on file  Tobacco Use   Smoking status: Never   Smokeless tobacco: Never  Vaping Use   Vaping Use: Never used  Substance and Sexual Activity   Alcohol use: Yes    Comment: rare   Drug use: No    Sexual activity: Yes    Birth control/protection: Surgical, Pill    Comment: ablation  Other Topics Concern   Not on file  Social History Narrative   Lives alone.   has 4 children.   Works at [employer], works in Academic librarian.  Exercise - just walking on the job.   As of 06/2020   Social Determinants of Health   Financial Resource Strain: Not on file  Food Insecurity: Not on file  Transportation Needs: Not on file  Physical Activity: Not on file  Stress: Not on file  Social Connections: Not on file  Intimate Partner Violence: Not on file    Review of Systems:  All other review of systems negative except as mentioned in the HPI.  Physical Exam: Vital signs BP 124/77    Pulse (!) 50    Temp 98 F (36.7 C)    Resp 13    Ht 5\' 5"  (1.651 m)    Wt 174 lb (78.9 kg)    SpO2 100%    BMI 28.96 kg/m   General:   Alert,  Well-developed, well-nourished, pleasant and cooperative in NAD Airway:  Mallampati 2 Lungs:  Clear throughout to auscultation.   Heart:  Regular rate and rhythm; no murmurs, clicks, rubs,  or gallops. Abdomen:  Soft, nontender and nondistended. Normal bowel sounds.   Neuro/Psych:  Normal mood and affect. A and O x 3   Elzia Hott E. Candis Schatz, MD [facility]

## 2021-10-21 NOTE — Progress Notes (Signed)
A and O x3. Report to RN. Tolerated MAC anesthesia well. 

## 2021-10-21 NOTE — Patient Instructions (Signed)
One polyp found today; awaiting pathology.   One handout given about polyps.   Continue your normal medicines.    YOU HAD AN ENDOSCOPIC PROCEDURE TODAY AT [facility]:   Refer to the procedure report that was given to you for any specific questions about what was found during the examination.  If the procedure report does not answer your questions, please call your gastroenterologist to clarify.  If you requested that your care partner not be given the details of your procedure findings, then the procedure report has been included in a sealed envelope for you to review at your convenience later.  YOU SHOULD EXPECT: Some feelings of bloating in the abdomen. Passage of more gas than usual.  Walking can help get rid of the air that was put into your GI tract during the procedure and reduce the bloating. If you had a lower endoscopy (such as a colonoscopy or flexible sigmoidoscopy) you may notice spotting of blood in your stool or on the toilet paper. If you underwent a bowel prep for your procedure, you may not have a normal bowel movement for a few days.  Please Note:  You might notice some irritation and congestion in your nose or some drainage.  This is from the oxygen used during your procedure.  There is no need for concern and it should clear up in a day or so.  SYMPTOMS TO REPORT IMMEDIATELY:  Following lower endoscopy (colonoscopy or flexible sigmoidoscopy):  Excessive amounts of blood in the stool  Significant tenderness or worsening of abdominal pains  Swelling of the abdomen that is new, acute  Fever of 100F or higher  For urgent or emergent issues, a gastroenterologist can be reached at any hour by calling [phone]. Do not use MyChart messaging for urgent concerns.    DIET:  We do recommend a small meal at first, but then you may proceed to your regular diet.  Drink plenty of fluids but you should avoid alcoholic beverages for 24 hours.  ACTIVITY:  You  should plan to take it easy for the rest of today and you should NOT DRIVE or use heavy machinery until tomorrow (because of the sedation medicines used during the test).    FOLLOW UP: Our staff will call the number listed on your records 48-72 hours following your procedure to check on you and address any questions or concerns that you may have regarding the information given to you following your procedure. If we do not reach you, we will leave a message.  We will attempt to reach you two times.  During this call, we will ask if you have developed any symptoms of COVID 19. If you develop any symptoms (ie: fever, flu-like symptoms, shortness of breath, cough etc.) before then, please call [phone].  If you test positive for Covid 19 in the 2 weeks post procedure, please call and report this information to [place].    If any biopsies were taken you will be contacted by phone or by letter within the next 1-3 weeks.  Please call us at [phone] if you have not heard about the biopsies in 3 weeks.    SIGNATURES/CONFIDENTIALITY: You and/or your care partner have signed paperwork which will be entered into your electronic medical record.  These signatures attest to the fact that that the information above on your After Visit Summary has been reviewed and is understood.  Full responsibility of the confidentiality of this discharge information lies with you and/or  your care-partner.

## 2021-10-21 NOTE — Op Note (Signed)
[place] Patient Name: Angel Morris Procedure Date: 10/21/2021 10:20 AM MRN: [id number] Endoscopist: Nicki Reaper E. Candis Schatz , MD Age: 46 Referring MD:  Date of Birth: [birth year] Gender: Female Account #: [id number] Procedure:                Colonoscopy Indications:              Screening for colorectal malignant neoplasm, This                            is the patient's first colonoscopy Medicines:                Monitored Anesthesia Care Procedure:                Pre-Anesthesia Assessment:                           - Prior to the procedure, a History and Physical                            was performed, and patient medications and                            allergies were reviewed. The patient's tolerance of                            previous anesthesia was also reviewed. The risks                            and benefits of the procedure and the sedation                            options and risks were discussed with the patient.                            All questions were answered, and informed consent                            was obtained. Prior Anticoagulants: The patient has                            taken no previous anticoagulant or antiplatelet                            agents. ASA Grade Assessment: II - A patient with                            mild systemic disease. After reviewing the risks                            and benefits, the patient was deemed in                            satisfactory condition to undergo the procedure.  After obtaining informed consent, the colonoscope                            was passed under direct vision. Throughout the                            procedure, the patient's blood pressure, pulse, and                            oxygen saturations were monitored continuously. The                            CF [id number] #[id number] was introduced through the anus                            and advanced to  the the terminal ileum, with                            identification of the appendiceal orifice and IC                            valve. The colonoscopy was somewhat difficult due                            to significant looping and a tortuous colon.                            Successful completion of the procedure was aided by                            using manual pressure. The quality of the bowel                            preparation was adequate. The terminal ileum,                            ileocecal valve, appendiceal orifice, and rectum                            were photographed. The bowel preparation used was                            Sutab via split dose instruction. Scope In: 10:44:23 AM Scope Out: 11:01:46 AM Scope Withdrawal Time: 0 hours 8 minutes 51 seconds  Total Procedure Duration: 0 hours 17 minutes 23 seconds  Findings:                 The perianal and digital rectal examinations were                            normal. Pertinent negatives include normal                            sphincter tone and no palpable rectal lesions.  A 2 mm polyp was found in the ascending colon. The                            polyp was sessile. The polyp was removed with a                            cold snare. Resection and retrieval were complete.                            Estimated blood loss was minimal.                           The exam was otherwise normal throughout the                            examined colon.                           The terminal ileum appeared normal.                           The retroflexed view of the distal rectum and anal                            verge was normal and showed no anal or rectal                            abnormalities. Complications:            No immediate complications. Estimated Blood Loss:     Estimated blood loss was minimal. Impression:               - One 2 mm polyp in the ascending colon, removed                             with a cold snare. Resected and retrieved.                           - The examined portion of the ileum was normal.                           - The distal rectum and anal verge are normal on                            retroflexion view. Recommendation:           - Patient has a contact number available for                            emergencies. The signs and symptoms of potential                            delayed complications were discussed with the  patient. Return to normal activities tomorrow.                            Written discharge instructions were provided to the                            patient.                           - Resume previous diet.                           - Continue present medications.                           - Await pathology results.                           - Repeat colonoscopy (date not yet determined) for                            surveillance based on pathology results. Kharizma Lesnick E. Candis Schatz, MD 10/21/2021 11:06:22 AM This report has been signed electronically.

## 2021-10-25 ENCOUNTER — Telehealth: Payer: Self-pay

## 2021-10-25 NOTE — Telephone Encounter (Signed)
Left message on 2nd follow up call. 

## 2021-10-25 NOTE — Telephone Encounter (Signed)
Left message on follow up call. 

## 2021-10-27 NOTE — Progress Notes (Signed)
Angel Morris,  The polyp which I removed during your recent procedure was proven to be completely benign but is considered a "pre-cancerous" polyp that MAY have grown into cancer if it had not been removed.  Studies shows that at least 20% of women over age 46 and 30% of men over age 40 have pre-cancerous polyps.  Based on current nationally recognized surveillance guidelines, I recommend that you have a repeat colonoscopy in 7 years.   If you develop any new rectal bleeding, abdominal pain or significant bowel habit changes, please contact me before then.

## 2021-12-26 ENCOUNTER — Telehealth: Payer: Self-pay | Admitting: Medical

## 2022-01-13 ENCOUNTER — Ambulatory Visit: Payer: BC Managed Care – PPO | Admitting: Medical

## 2022-01-13 ENCOUNTER — Encounter: Payer: Self-pay | Admitting: Medical

## 2022-01-13 VITALS — BP 124/82 | HR 58 | Wt 166.2 lb

## 2022-01-13 DIAGNOSIS — M25562 Pain in left knee: Secondary | ICD-10-CM

## 2022-01-13 DIAGNOSIS — T148XXA Other injury of unspecified body region, initial encounter: Secondary | ICD-10-CM

## 2022-01-13 DIAGNOSIS — M62830 Muscle spasm of back: Secondary | ICD-10-CM | POA: Diagnosis not present

## 2022-01-13 DIAGNOSIS — M2142 Flat foot [pes planus] (acquired), left foot: Secondary | ICD-10-CM

## 2022-01-13 DIAGNOSIS — M2141 Flat foot [pes planus] (acquired), right foot: Secondary | ICD-10-CM

## 2022-01-13 DIAGNOSIS — M2140 Flat foot [pes planus] (acquired), unspecified foot: Secondary | ICD-10-CM | POA: Insufficient documentation

## 2022-01-13 DIAGNOSIS — M7750 Other enthesopathy of unspecified foot: Secondary | ICD-10-CM | POA: Insufficient documentation

## 2022-01-13 DIAGNOSIS — G8929 Other chronic pain: Secondary | ICD-10-CM

## 2022-01-13 DIAGNOSIS — M79671 Pain in right foot: Secondary | ICD-10-CM | POA: Insufficient documentation

## 2022-01-13 DIAGNOSIS — M79672 Pain in left foot: Secondary | ICD-10-CM

## 2022-01-13 MED ORDER — TIZANIDINE HCL 4 MG PO TABS: 4.0000 mg | ORAL_TABLET | Freq: Two times a day (BID) | ORAL | 0 refills | Status: DC | PRN

## 2022-01-13 NOTE — Progress Notes (Signed)
Subjective:  Angel Morris is a 46 y.o. female who presents for Chief Complaint  Patient presents with   other    Multi issues, lower back pain rt. Side, has paperwork for work to not were safety shoes.      Here for multiple concerns  She notes acute back pain.   Was playing at the park this past week with the kids about 2 days ago.  Daughter threw a dodge ball to her, and when she went to reach to catch the ball, felt a pull in her back. She did not fall thought.   No pains, numbness or tingling down leg.   No incontinence.  Been having pain since for the past 2 days.   Hurts to sit, lie.   Using some ibuprofen.   She needs a form completed for work.  Human resources requiring a special form for her to be allowed to use a shoe of her choice instead of the standard steel toed shoes that they require.  She is a pack line lead.   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  She does not deal with heavy rolls of material that some other workers do it.  For several years now she has been allowed to use a shoe that works for her without having to use the typical more heavy-duty steel toe boots.  Her company has a Advertising copywriter for still toed footwear but they never feel comfortable in her feet despite inserts or other remedies.  She has seen podiatry before because of her feet and ankle issues.  She is having some left knee pains.  She feels like her left knee is up to go out of joint.  She feels like it is moving her mobile causing some instability and pain.  No recent trauma or injury in this regard.  She notes history of hypermobile joints  No other aggravating or relieving factors.    No other c/o.  The following portions of the patient's history were reviewed and updated as appropriate: allergies, current medications, past family history, past medical history, past social history, past surgical history and problem list.  ROS Otherwise as in subjective  above  Objective: BP 124/82   Pulse (!) 58   Wt 166 lb 3.2 oz (75.4 kg)   BMI 27.66 kg/m   Wt Readings from Last 3 Encounters:  01/13/22 166 lb 3.2 oz (75.4 kg)  10/21/21 174 lb (78.9 kg)  09/08/21 174 lb (78.9 kg)    General appearance: alert, no distress, well developed, well nourished Left knee with some laxity with valgus stress and patella seems little bit more mobile than typical and definitely asymmetrically different than right leg.  Positive crepitus of left knee.  Otherwise legs nontender no other swelling or deformity Legs otherwise neurovascularly intact Right low back paraspinal region tender, pain with range of motion even with minimal range of motion.  Normal leg strength and sensation Legs neurovascularly intact   Assessment: Encounter Diagnoses  Name Primary?   Back spasm Yes   Muscle strain    Pes planus of both feet    Tendonitis of ankle    Foot pain, bilateral    Chronic pain of left knee      Plan: Back spasm and strain-advised several days of relative rest, stretching, can use heat or combination of heat and ice therapy as discussed.  She has ibuprofen 800 mg she can use twice daily for the next  few days.  Muscle laxer as needed.  Discussed risk and benefits proper use of medication.  Chronic knee pain, laxity of knee -referral to orthopedics  History of flatfeet, tendinitis of ankle and foot pain.  She has seen podiatry in the past and I reviewed those records.  She has tried various footwear and inserts over the years.  She does not tolerate heavy duty steel toed boots or shoes.  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  We discussed the risk of injury if not wearing steel toe boots.  She has attachment steel toes she can put on her shoes.     Harini was seen today for other.  Diagnoses and all orders for this visit:  Back spasm  Muscle strain  Pes planus of both  feet  Tendonitis of ankle  Foot pain, bilateral  Chronic pain of left knee -     AMB referral to orthopedics  Other orders -     tiZANidine (ZANAFLEX) 4 MG tablet; Take 1 tablet (4 mg total) by mouth 2 (two) times daily as needed for muscle spasms.    Follow up: prn

## 2022-01-25 ENCOUNTER — Encounter: Payer: Self-pay | Admitting: Adult Health

## 2022-01-25 ENCOUNTER — Ambulatory Visit (INDEPENDENT_AMBULATORY_CARE_PROVIDER_SITE_OTHER): Payer: BC Managed Care – PPO | Admitting: Adult Health

## 2022-01-25 VITALS — BP 125/78 | HR 51 | Ht 64.0 in | Wt 168.0 lb

## 2022-01-25 DIAGNOSIS — N926 Irregular menstruation, unspecified: Secondary | ICD-10-CM | POA: Insufficient documentation

## 2022-01-25 DIAGNOSIS — Z3041 Encounter for surveillance of contraceptive pills: Secondary | ICD-10-CM | POA: Diagnosis not present

## 2022-01-25 DIAGNOSIS — N946 Dysmenorrhea, unspecified: Secondary | ICD-10-CM

## 2022-01-25 MED ORDER — LO LOESTRIN FE 1 MG-10 MCG / 10 MCG PO TABS: 1.0000 | ORAL_TABLET | Freq: Every day | ORAL | 0 refills | Status: DC

## 2022-01-25 NOTE — Progress Notes (Signed)
  Subjective:     Patient ID: Angel Morris, female   DOB: 1975/09/18, 46 y.o.   MRN: [id number]  HPI Munachimso is a 46 year old black female,divorced, [id number] in complaining of having period every 2 weeks, she is on Micronor and sp ablation.  Lab Results  Component Value Date   DIAGPAP  10/01/2020    - Negative for intraepithelial lesion or malignancy (NILM)   HPV NOT DETECTED 02/18/2018   [place] Negative 10/01/2020   PCP is D. Tysinger PA   Review of Systems Having period every 2 weeks for last 3 months or so +menstrual cramps Reviewed past medical,surgical, social and family history. Reviewed medications and allergies.     Objective:   Physical Exam BP 125/78 (BP Location: Right Arm, Patient Position: Sitting, Cuff Size: Normal)   Pulse (!) 51   Ht '5\' 4"'$  (1.626 m)   Wt 168 lb (76.2 kg)   LMP 01/15/2022   BMI 28.84 kg/m     Skin warm and dry.Pelvic: external genitalia is normal in appearance no lesions, vagina: white discharge without odor,urethra has no lesions or masses noted, cervix:smooth and bulbous, uterus: normal size, shape and contour, non tender, no masses felt, adnexa: no masses or tenderness noted. Bladder is non tender and no masses felt. Fall risk is low  Up[facility] - 01/25/22 1414       Pregnancy Intention Screening   Does the patient want to become pregnant in the next year? No    Does the patient's partner want to become pregnant in the next year? No    Would the patient like to discuss contraceptive options today? No      Contraception Wrap Up   Current Method Oral Contraceptive    End Method Oral Contraceptive    Contraception Counseling Provided No            Examination chaperoned by Celene Squibb LPN  Assessment:     1. Irregular bleeding Finish current pack of pills(Micronor) then start lo Loestrin, and use condoms for 1 pack  2. Menstrual cramps Will try lo Loestrin   3. Encounter for surveillance of contraceptive pills Meds ordered  this encounter  Medications   Norethindrone-Ethinyl Estradiol-Fe Biphas (LO LOESTRIN FE) 1 MG-10 MCG / 10 MCG tablet    Sig: Take 1 tablet by mouth daily. Take 1 daily by mouth    Dispense:  84 tablet    Refill:  0    BIN [id number], PCN CN, GRP [id number]    Order Specific Question:   Supervising Provider    Answer:   Florian Buff [[id number]]       Plan:     Follow up in 10 weeks with me for ROS and BP check

## 2022-02-02 DIAGNOSIS — M25361 Other instability, right knee: Secondary | ICD-10-CM | POA: Diagnosis not present

## 2022-02-02 DIAGNOSIS — M25362 Other instability, left knee: Secondary | ICD-10-CM | POA: Diagnosis not present

## 2022-02-02 DIAGNOSIS — G8929 Other chronic pain: Secondary | ICD-10-CM | POA: Diagnosis not present

## 2022-02-02 DIAGNOSIS — M25562 Pain in left knee: Secondary | ICD-10-CM | POA: Diagnosis not present

## 2022-02-10 ENCOUNTER — Ambulatory Visit: Payer: BC Managed Care – PPO | Admitting: Medical

## 2022-02-10 ENCOUNTER — Encounter: Payer: Self-pay | Admitting: Medical

## 2022-02-10 VITALS — BP 120/80 | HR 65 | Temp 98.4°F | Wt 169.8 lb

## 2022-02-10 DIAGNOSIS — M7989 Other specified soft tissue disorders: Secondary | ICD-10-CM

## 2022-02-10 DIAGNOSIS — M25561 Pain in right knee: Secondary | ICD-10-CM | POA: Diagnosis not present

## 2022-02-10 DIAGNOSIS — R600 Localized edema: Secondary | ICD-10-CM | POA: Diagnosis not present

## 2022-02-10 DIAGNOSIS — M25562 Pain in left knee: Secondary | ICD-10-CM

## 2022-02-10 DIAGNOSIS — G8929 Other chronic pain: Secondary | ICD-10-CM

## 2022-02-10 LAB — POCT URINALYSIS DIP (PROADVANTAGE DEVICE)
Bilirubin, UA: NEGATIVE
Blood, UA: NEGATIVE
Glucose, UA: NEGATIVE mg/dL
Ketones, POC UA: NEGATIVE mg/dL
Leukocytes, UA: NEGATIVE
Nitrite, UA: NEGATIVE
Protein Ur, POC: NEGATIVE mg/dL
Specific Gravity, Urine: 1.03
Urobilinogen, Ur: NEGATIVE
pH, UA: 6 (ref 5.0–8.0)

## 2022-02-10 NOTE — Progress Notes (Signed)
Subjective:  Angel Morris is a 46 y.o. female who presents for Chief Complaint  Patient presents with   swelling from knees to feet    Swelling from knees to feet. Tightness, tingling, worse with both standing and sitting, x 6 months or more     She notes legs were quite swollen a few days ago.  Knees to toes swollen a few days ago.  She has been having knee pains for a while.  She did have a recent consult with orthopedics.  They referred her to physical therapy.  They never did tell her what the x-ray showed on her knees if she is waiting to find this.  She does get specifically knee swelling and lower leg swelling.  This is intermittent.  Usually improved at the start of the day after being asleep and begin supine.  She denies any chest pain, shortness of breath, dark urine or unilateral swelling.  She has been on amlodipine a long time and tolerates this well.  She uses ibuprofen mainly around the time of her period every month.  She has not yet started physical therapy.  No other aggravating or relieving factors.    No other c/o.  Past Medical History:  Diagnosis Date   Abnormal pap    lSIL in the past   Anemia, iron deficiency    in past, not as of 05/2016   Anxiety    Asthma    childhood   Complication of anesthesia    States "when I had kidney stones removed(Open); I had a build up of fluid in my lungs. But I dont really remember much about it otherwise".   Herpes 04/05/2016   History of pyelonephritis    History of recurrent UTIs    Human papilloma virus    Hypertension    Menorrhagia    Nephrolithiasis    Overweight(278.02)    Proteinuria    Uterine fibroid    Vaginal Pap smear, abnormal    Wears contact lenses    Current Outpatient Medications on File Prior to Visit  Medication Sig Dispense Refill   amLODipine (NORVASC) 10 MG tablet Take 1 tablet (10 mg total) by mouth daily. 90 tablet 3   AZO-CRANBERRY PO Take by mouth.     cholecalciferol (VITAMIN D3) 25 MCG  (1000 UNIT) tablet Take 1 tablet (1,000 Units total) by mouth daily. 90 tablet 3   ibuprofen (ADVIL) 800 MG tablet Take 1 tablet (800 mg total) by mouth every 8 (eight) hours as needed. 60 tablet 1   Norethindrone-Ethinyl Estradiol-Fe Biphas (LO LOESTRIN FE) 1 MG-10 MCG / 10 MCG tablet Take 1 tablet by mouth daily. Take 1 daily by mouth 84 tablet 0   Probiotic Product (PROBIOTIC-10 PO) Take by mouth.     Pumpkin Seed-Soy Germ (AZO BLADDER CONTROL/GO-LESS PO) Take by mouth.     zolpidem (AMBIEN) 10 MG tablet Take 1 tablet (10 mg total) by mouth at bedtime as needed for sleep. (Patient not taking: Reported on 09/08/2021) 20 tablet 0   No current facility-administered medications on file prior to visit.     The following portions of the patient's history were reviewed and updated as appropriate: allergies, current medications, past family history, past medical history, past social history, past surgical history and problem list.  ROS Otherwise as in subjective above    Objective: BP 120/80   Pulse 65   Temp 98.4 F (36.9 C)   Wt 169 lb 12.8 oz (77 kg)  LMP 01/15/2022   BMI 29.15 kg/m   Wt Readings from Last 3 Encounters:  02/10/22 169 lb 12.8 oz (77 kg)  01/25/22 168 lb (76.2 kg)  01/13/22 166 lb 3.2 oz (75.4 kg)    General appearance: alert, no distress, well developed, well nourished There seems to be a little laxity of the left patella and left knee joint with valgus stress but otherwise legs nontender with normal range of motion no obvious deformity.   Pulses: 2+ radial pulses, 2+ pedal pulses, normal cap refill Ext: no edema Legs with normal strength and sensation.    Assessment: Encounter Diagnoses  Name Primary?   Leg swelling Yes   Chronic pain of both knees      Plan: We discussed her symptoms and concerns.  We discussed more serious causes of swelling such as kidney failure heart failure or DVT.  There are no overt signs of any of this today.  Urinalysis was  normal.  Her last kidney function from December 2022 was normal.  She does stand for prolonged periods at work.  I suspect the main cause of her swelling has more to do with dependent edema and venous insufficiency somewhat.  She does not consume a lot of salt.  However she has had some recent knee swelling and leg swelling which suggest it may be there is some inflammation within the knee joint with some residual swelling as well  I recommend she start using over-the-counter compression hose.  She will let me know if she has a hard time getting these.  Recommended stretching and regular walking for exercise.  Follow-up with orthopedics about the x-ray results and about starting physical therapy.  I reviewed a CT hematuria kidney scan from 2017 as well as last Pap 2022 normal  If she continues to have swelling despite compression hose, consider updated kidney imaging and updated basic metabolic panel lab  Angel Morris was seen today for swelling from knees to feet.  Diagnoses and all orders for this visit:  Leg swelling  Chronic pain of both knees    Follow up: pending ortho follow up

## 2022-02-10 NOTE — Addendum Note (Signed)
Addended by: Minette Headland A on: 02/10/2022 01:49 PM   Modules accepted: Orders

## 2022-02-22 DIAGNOSIS — G8929 Other chronic pain: Secondary | ICD-10-CM | POA: Diagnosis not present

## 2022-02-22 DIAGNOSIS — M25362 Other instability, left knee: Secondary | ICD-10-CM | POA: Diagnosis not present

## 2022-02-22 DIAGNOSIS — M25361 Other instability, right knee: Secondary | ICD-10-CM | POA: Diagnosis not present

## 2022-02-22 DIAGNOSIS — M25562 Pain in left knee: Secondary | ICD-10-CM | POA: Diagnosis not present

## 2022-04-05 ENCOUNTER — Encounter: Payer: Self-pay | Admitting: Adult Health

## 2022-04-05 ENCOUNTER — Ambulatory Visit (INDEPENDENT_AMBULATORY_CARE_PROVIDER_SITE_OTHER): Payer: BC Managed Care – PPO | Admitting: Adult Health

## 2022-04-05 VITALS — BP 127/84 | HR 79 | Ht 64.0 in | Wt 166.0 lb

## 2022-04-05 DIAGNOSIS — N946 Dysmenorrhea, unspecified: Secondary | ICD-10-CM | POA: Diagnosis not present

## 2022-04-05 DIAGNOSIS — Z3041 Encounter for surveillance of contraceptive pills: Secondary | ICD-10-CM

## 2022-04-05 DIAGNOSIS — Z9889 Other specified postprocedural states: Secondary | ICD-10-CM

## 2022-04-05 DIAGNOSIS — N926 Irregular menstruation, unspecified: Secondary | ICD-10-CM | POA: Diagnosis not present

## 2022-04-05 MED ORDER — LO LOESTRIN FE 1 MG-10 MCG / 10 MCG PO TABS: 1.0000 | ORAL_TABLET | Freq: Every day | ORAL | 3 refills | Status: DC

## 2022-04-05 NOTE — Progress Notes (Signed)
  Subjective:     Patient ID: Angel Morris, female   DOB: 1976-02-06, 46 y.o.   MRN: [id number]  HPI [name] is a 46 year old black female,divorced, [id number] back in follow up on bleeding and cramping and ios much better on both. Period in July was spotty and dark and no cramps. PCP is D Estate agent PA. Lab Results  Component Value Date   DIAGPAP  10/01/2020    - Negative for intraepithelial lesion or malignancy (NILM)   HPV NOT DETECTED 02/18/2018   [place] Negative 10/01/2020    Review of Systems Bleeding better Cramps much better Reviewed past medical,surgical, social and family history. Reviewed medications and allergies.     Objective:   Physical Exam BP 127/84 (BP Location: Right Arm, Patient Position: Sitting, Cuff Size: Normal)   Pulse 79   Ht '5\' 4"'$  (1.626 m)   Wt 166 lb (75.3 kg)   LMP 03/13/2022   BMI 28.49 kg/m     Skin warm and dry. Lungs: clear to ausculation bilaterally. Cardiovascular: regular rate and rhythm.   [facility] - 04/05/22 1533       Pregnancy Intention Screening   Does the patient want to become pregnant in the next year? No    Does the patient's partner want to become pregnant in the next year? No    Would the patient like to discuss contraceptive options today? No      Contraception Wrap Up   Current Method Oral Contraceptive    End Method Oral Contraceptive    Contraception Counseling Provided No             Assessment:     1. Irregular bleeding Seems to be resolved with lo Loestrin   2. Menstrual cramps Much better with lo Loestrin   3. Encounter for surveillance of contraceptive pills Will continue lo Loestrin, 2 sample packs and discount card given  Meds ordered this encounter  Medications   Norethindrone-Ethinyl Estradiol-Fe Biphas (LO LOESTRIN FE) 1 MG-10 MCG / 10 MCG tablet    Sig: Take 1 tablet by mouth daily. Take 1 daily by mouth    Dispense:  84 tablet    Refill:  3    BIN [id number], PCN CN, GRP [id number]     Order Specific Question:   Supervising Provider    Answer:   Tania Ade H [[id number]]     4. S/P endometrial ablation    Plan:     Follow up in 6 months with me

## 2022-05-03 ENCOUNTER — Encounter: Payer: Self-pay | Admitting: Internal Medicine

## 2022-06-06 ENCOUNTER — Encounter: Payer: Self-pay | Admitting: Internal Medicine

## 2022-06-30 ENCOUNTER — Ambulatory Visit
Admission: RE | Admit: 2022-06-30 | Discharge: 2022-06-30 | Disposition: A | Discharge status: Home / Self Care | Payer: BC Managed Care – PPO | Source: Ambulatory Visit | Attending: Family Medicine | Admitting: Family Medicine

## 2022-06-30 ENCOUNTER — Ambulatory Visit: Payer: BC Managed Care – PPO | Admitting: Family Medicine

## 2022-06-30 VITALS — BP 120/84 | HR 63 | Wt 172.2 lb

## 2022-06-30 DIAGNOSIS — M79644 Pain in right finger(s): Secondary | ICD-10-CM

## 2022-06-30 DIAGNOSIS — S6991XA Unspecified injury of right wrist, hand and finger(s), initial encounter: Secondary | ICD-10-CM | POA: Diagnosis not present

## 2022-06-30 NOTE — Progress Notes (Signed)
   Subjective:    Patient ID: Angel Morris, female    DOB: 03-Nov-1975, 46 y.o.   MRN: [id number]  HPI She injured her right fifth finger when she got slammed in a car door and has had pain since then which was 5 weeks ago.  She notes this especially when she tries to shake someone's hand.   Review of Systems     Objective:   Physical Exam Exam of the right fifth finger shows normal motion of the finger.  No tenderness palpation over the DIP joint but some tenderness between the PIP and DIP joint.  Normal sensation.       Assessment & Plan:  Pain of finger of right hand - Plan: DG Finger Little Right Since is bothering her for over 5 weeks, I think an x-ray is appropriate.  Did recommend she buddy tape it and taped it for her to show how it is done.

## 2022-07-28 ENCOUNTER — Ambulatory Visit (INDEPENDENT_AMBULATORY_CARE_PROVIDER_SITE_OTHER): Payer: BC Managed Care – PPO | Admitting: Medical

## 2022-07-28 VITALS — BP 120/70 | HR 74 | Ht 64.25 in | Wt 171.6 lb

## 2022-07-28 DIAGNOSIS — Z13220 Encounter for screening for lipoid disorders: Secondary | ICD-10-CM | POA: Diagnosis not present

## 2022-07-28 DIAGNOSIS — G8929 Other chronic pain: Secondary | ICD-10-CM

## 2022-07-28 DIAGNOSIS — E559 Vitamin D deficiency, unspecified: Secondary | ICD-10-CM

## 2022-07-28 DIAGNOSIS — M25571 Pain in right ankle and joints of right foot: Secondary | ICD-10-CM

## 2022-07-28 DIAGNOSIS — I10 Essential (primary) hypertension: Secondary | ICD-10-CM | POA: Diagnosis not present

## 2022-07-28 DIAGNOSIS — M79644 Pain in right finger(s): Secondary | ICD-10-CM | POA: Insufficient documentation

## 2022-07-28 DIAGNOSIS — M545 Low back pain, unspecified: Secondary | ICD-10-CM

## 2022-07-28 DIAGNOSIS — Z87442 Personal history of urinary calculi: Secondary | ICD-10-CM

## 2022-07-28 DIAGNOSIS — Z0000 Encounter for general adult medical examination without abnormal findings: Secondary | ICD-10-CM

## 2022-07-28 DIAGNOSIS — J4520 Mild intermittent asthma, uncomplicated: Secondary | ICD-10-CM

## 2022-07-28 NOTE — Progress Notes (Signed)
Subjective:   HPI  Angel Morris is a 46 y.o. female who presents for a complete physical.  Medical team: [facility] for dentist Eye doctor Dr. Margarette Canada, [facility] Dr. Dustin Flock, GI Agustine Rossitto, Camelia Eng, PA-C here for primary care   Concerns: Need physical form completed for work  She notes she slammed right pinky in a door 2 months ago.   Came here and saw Dr. Redmond School recently for the same.  Was sent for xray, no fracture, but still having pain.   Reviewed their medical, surgical, family, social, medication, and allergy history and updated chart as appropriate.  Past Medical History:  Diagnosis Date   Abnormal pap    lSIL in the past   Anemia, iron deficiency    in past, not as of 05/2016   Anxiety    Asthma    childhood   Complication of anesthesia    States "when I had kidney stones removed(Open); I had a build up of fluid in my lungs. But I dont really remember much about it otherwise".   Herpes 04/05/2016   History of pyelonephritis    History of recurrent UTIs    Human papilloma virus    Hypertension    Menorrhagia    Nephrolithiasis    Overweight(278.02)    Uterine fibroid    Vaginal Pap smear, abnormal    Wears contact lenses     Past Surgical History:  Procedure Laterality Date   CERVICAL CONIZATION W/BX N/A 10/08/2013   Procedure: CONIZATION CERVIX WITH BIOPSY;  Surgeon: Florian Buff, MD;  Location: [facility];  Service: Gynecology;  Laterality: N/A;   DILATION AND CURETTAGE OF UTERUS     DILITATION & CURRETTAGE/HYSTROSCOPY WITH NOVASURE ABLATION N/A 07/02/2015   Procedure: DILATATION & CURETTAGE/HYSTEROSCOPY WITH NOVASURE ABLATION;  Surgeon: Florian Buff, MD;  Location: [facility];  Service: Gynecology;  Laterality: N/A;  Uterine Cavity Length 5.5 Uterine Cavity Width 3.5 Power 106 Time 1 minute 4 seconds   HOLMIUM LASER APPLICATION N/A 11/18/5571   Procedure: HOLMIUM LASER APPLICATION;  Surgeon: Florian Buff, MD;   Location: [facility];  Service: Gynecology;  Laterality: N/A;   kidney stones  june 2000   open removal   rotary cuff - left shoulder  2005     Family History  Problem Relation Age of Onset   Fibromyalgia Mother    Arthritis Mother        RA   Hypertension Mother    Diabetes Mother    Diabetes Father        diabetes, amputation   Heart disease Father 76       died of MI Jan 21, 2017   Kidney disease Father        dialysis   Diabetes Brother    Hypertension Brother    Stroke Maternal Grandmother    Aneurysm Maternal Grandfather    Stroke Maternal Grandfather    Diabetes Son    Cancer Neg Hx    Colon polyps Neg Hx    Rectal cancer Neg Hx    Stomach cancer Neg Hx      Current Outpatient Medications:    amLODipine (NORVASC) 10 MG tablet, Take 1 tablet (10 mg total) by mouth daily., Disp: 90 tablet, Rfl: 3   cholecalciferol (VITAMIN D3) 25 MCG (1000 UNIT) tablet, Take 1 tablet (1,000 Units total) by mouth daily., Disp: 90 tablet, Rfl: 3   ibuprofen (ADVIL) 800 MG tablet, Take 1 tablet (800 mg  total) by mouth every 8 (eight) hours as needed., Disp: 60 tablet, Rfl: 1   Norethindrone-Ethinyl Estradiol-Fe Biphas (LO LOESTRIN FE) 1 MG-10 MCG / 10 MCG tablet, Take 1 tablet by mouth daily. Take 1 daily by mouth, Disp: 84 tablet, Rfl: 3  Allergies  Allergen Reactions   Shellfish Allergy Itching    With swelling    Review of Systems  Constitutional:  Negative for chills, fever, malaise/fatigue and weight loss.  HENT:  Negative for congestion, ear pain, hearing loss, sore throat and tinnitus.   Eyes:  Negative for blurred vision, pain and redness.  Respiratory:  Negative for cough, hemoptysis and shortness of breath.   Cardiovascular:  Negative for chest pain, palpitations, orthopnea, claudication and leg swelling.  Gastrointestinal:  Negative for abdominal pain, blood in stool, constipation, diarrhea, nausea and vomiting.  Genitourinary:  Negative for dysuria, flank pain, frequency,  hematuria and urgency.  Musculoskeletal:  Positive for joint pain and myalgias. Negative for falls.  Skin:  Negative for itching and rash.  Neurological:  Negative for dizziness, tingling, speech change, weakness and headaches.  Endo/Heme/Allergies:  Negative for polydipsia. Does not bruise/bleed easily.  Psychiatric/Behavioral:  Negative for depression and memory loss. The patient is not nervous/anxious and does not have insomnia.        Objective:   Physical Exam  BP 120/70   Pulse 74   Ht 5' 4.25" (1.632 m)   Wt 171 lb 9.6 oz (77.8 kg)   BMI 29.23 kg/m   Wt Readings from Last 3 Encounters:  07/28/22 171 lb 9.6 oz (77.8 kg)  06/30/22 172 lb 3.2 oz (78.1 kg)  04/05/22 166 lb (75.3 kg)    General appearance: alert, no distress, WD/WN, AA female Skin: no worrisome findings, tattoo right forearm HEENT: normocephalic, conjunctiva/corneas normal, sclerae anicteric, PERRLA, EOMi, nares patent, no discharge or erythema, pharynx normal Oral cavity: MMM, tongue normal, teeth in good repair Neck: supple, no lymphadenopathy, no thyromegaly, no masses, normal ROM, no bruits Chest: non tender, normal shape and expansion Heart: RRR, normal S1, S2, no murmurs Lungs: CTA bilaterally, no wheezes, rhonchi, or rales Abdomen: +bs, soft, mild generalized tendnerss, otherwise non tender, non distended, no masses, no hepatomegaly, no splenomegaly, no bruits Back: Mild pain noted when she was extending her back which is a little reduced but overall range of motion fairly normal.  Back nontender, no scoliosis Musculoskeletal: Right hip with decreased range of motion internal and external rotation, but otherwise legs nontender, right distal phalanx and DIP tender of the right hand fifth finger, no laxity, strength seems normal, no swelling, extremities non tender, no obvious deformity, normal ROM throughout, lower extremities non tender, no obvious deformity, normal ROM throughout Extremities: no edema,  no cyanosis, no clubbing Pulses: 2+ symmetric, upper and lower extremities, normal cap refill Neurological: alert, oriented x 3, CN2-12 intact, strength normal upper extremities and lower extremities, sensation normal throughout, DTRs 2+ throughout, no cerebellar signs, gait normal Psychiatric: normal affect, behavior normal, pleasant  Breast/gyn/rectal - deferred to gyn    Assessment and Plan :    Encounter Diagnoses  Name Primary?   Routine general medical examination at a health care facility Yes   Vitamin D deficiency    History of renal stone    Essential hypertension    Mild intermittent asthma without complication    Finger pain, right    Chronic pain of right ankle    Chronic bilateral low back pain without sciatica    Screening for lipid  disorders      Today you had a preventative care visit or wellness visit.    Topics today may have included healthy lifestyle, diet, exercise, preventative care, vaccinations, sick and well care, proper use of emergency dept and after hours care, as well as other concerns.     Recommendations: Continue to return yearly for your annual wellness and preventative care visits.  This gives [place] a chance to discuss healthy lifestyle, exercise, vaccinations, review your chart record, and perform screenings where appropriate.  I recommend you see your eye doctor yearly for routine vision care.  I recommend you see your dentist yearly for routine dental care including hygiene visits twice yearly.  See your gynecologist yearly for routine gynecological care.    Vaccination recommendations were reviewed Immunization History  Administered Date(s) Administered   H1N1 07/31/2008   Influenza Split 05/20/2021, 04/28/2022   Influenza,inj,Quad PF,6+ Mos 06/08/2016   Influenza-Unspecified 05/17/2017, 06/18/2019, 05/24/2020   PFIZER(Purple Top)SARS-COV-2 Vaccination 02/07/2020, 03/01/2020   Tdap 12/15/2010, 07/28/2021     Screening for  cancer: Breast cancer screening: You should perform a self breast exam monthly.   Continue routine mammograms  Colon cancer screening:  Reviewed 2023 colonoscopy report  Cervical cancer screening: We reviewed recommendations for pap smear screening.  Sees gyn  Skin cancer screening: Check your skin regularly for new changes, growing lesions, or other lesions of concern Come in for evaluation if you have skin lesions of concern.  We currently don't have screenings for other cancers besides breast, cervical, colon, and lung cancers.  If you have a strong family history of cancer or have other cancer screening concerns, please let me know.    Bone health: Get at least 150 minutes of aerobic exercise weekly Get weight bearing exercise at least once weekly    Heart health: Get at least 150 minutes of aerobic exercise weekly Limit alcohol It is important to maintain a healthy blood pressure and healthy cholesterol numbers    Separate significant issues discussed: Right finger pain - advised splint and buddy tape for a week.  I applied splint and buddy tape today.  If not improving in a week or 2, plan referral to ortho or occupation therapy.   Today her strength and ROM is normal.     Low back pain and right hip ROM decreased - work on stretching, flexibility.  No radicular symptoms or signs.    If not improving in the next few weeks, then recheck.  No major indication for xray today  Chronic right ankle pain - offered referral back to ortho or podiatry. She declines for now  Asthma-no recent problems  Hypertension-continue current medication   Lyndsy was seen today for fasting cpe, .  Diagnoses and all orders for this visit:  Routine general medical examination at a health care facility -     Comprehensive metabolic panel -     CBC -     Lipid panel -     VITAMIN D 25 Hydroxy (Vit-D Deficiency, Fractures)  Vitamin D deficiency -     VITAMIN D 25 Hydroxy (Vit-D  Deficiency, Fractures)  History of renal stone  Essential hypertension  Mild intermittent asthma without complication  Finger pain, right  Chronic pain of right ankle  Chronic bilateral low back pain without sciatica  Screening for lipid disorders -     Lipid panel   Follow-up pending labs, yearly for physical

## 2022-07-29 LAB — COMPREHENSIVE METABOLIC PANEL
ALT: 10 IU/L (ref 0–32)
AST: 13 IU/L (ref 0–40)
Albumin/Globulin Ratio: 1.7 (ref 1.2–2.2)
Albumin: 4.4 g/dL (ref 3.9–4.9)
Alkaline Phosphatase: 66 IU/L (ref 44–121)
BUN/Creatinine Ratio: 13 (ref 9–23)
BUN: 10 mg/dL (ref 6–24)
Bilirubin Total: 0.9 mg/dL (ref 0.0–1.2)
CO2: 19 mmol/L — ABNORMAL LOW (ref 20–29)
Calcium: 9.4 mg/dL (ref 8.7–10.2)
Chloride: 103 mmol/L (ref 96–106)
Creatinine, Ser: 0.75 mg/dL (ref 0.57–1.00)
Globulin, Total: 2.6 g/dL (ref 1.5–4.5)
Glucose: 92 mg/dL (ref 70–99)
Potassium: 4.1 mmol/L (ref 3.5–5.2)
Sodium: 138 mmol/L (ref 134–144)
Total Protein: 7 g/dL (ref 6.0–8.5)
eGFR: 99 mL/min/{1.73_m2} (ref >59)

## 2022-07-29 LAB — CBC
Hematocrit: 39.4 % (ref 34.0–46.6)
Hemoglobin: 13.3 g/dL (ref 11.1–15.9)
MCH: 28 pg (ref 26.6–33.0)
MCHC: 33.8 g/dL (ref 31.5–35.7)
MCV: 83 fL (ref 79–97)
Platelets: 269 10*3/uL (ref 150–450)
RBC: 4.75 x10E6/uL (ref 3.77–5.28)
RDW: 12.9 % (ref 11.7–15.4)
WBC: 9.1 10*3/uL (ref 3.4–10.8)

## 2022-07-29 LAB — LIPID PANEL
Chol/HDL Ratio: 2.2 ratio (ref 0.0–4.4)
Cholesterol, Total: 153 mg/dL (ref 100–199)
HDL: 71 mg/dL (ref >39)
LDL Chol Calc (NIH): 72 mg/dL (ref 0–99)
Triglycerides: 44 mg/dL (ref 0–149)
VLDL Cholesterol Cal: 10 mg/dL (ref 5–40)

## 2022-07-29 LAB — VITAMIN D 25 HYDROXY (VIT D DEFICIENCY, FRACTURES): Vit D, 25-Hydroxy: 33 ng/mL (ref 30.0–100.0)

## 2022-07-31 ENCOUNTER — Other Ambulatory Visit: Payer: Self-pay | Admitting: Medical

## 2022-07-31 DIAGNOSIS — I10 Essential (primary) hypertension: Secondary | ICD-10-CM

## 2022-07-31 MED ORDER — AMLODIPINE BESYLATE 10 MG PO TABS: 10.0000 mg | ORAL_TABLET | Freq: Every day | ORAL | 3 refills | Status: DC

## 2022-07-31 MED ORDER — VITAMIN D 50 MCG (2000 UT) PO CAPS: 1.0000 | ORAL_CAPSULE | Freq: Every day | ORAL | 3 refills | Status: DC

## 2022-08-15 ENCOUNTER — Encounter: Payer: BC Managed Care – PPO | Admitting: Medical

## 2022-09-12 ENCOUNTER — Other Ambulatory Visit: Payer: Self-pay | Admitting: Medical

## 2022-09-12 ENCOUNTER — Telehealth: Payer: BC Managed Care – PPO | Admitting: Medical

## 2022-09-12 ENCOUNTER — Encounter: Payer: Self-pay | Admitting: Medical

## 2022-09-12 VITALS — BP 128/86 | HR 70 | Temp 99.8°F | Ht 64.0 in | Wt 172.6 lb

## 2022-09-12 DIAGNOSIS — B349 Viral infection, unspecified: Secondary | ICD-10-CM | POA: Diagnosis not present

## 2022-09-12 DIAGNOSIS — R059 Cough, unspecified: Secondary | ICD-10-CM | POA: Diagnosis not present

## 2022-09-12 MED ORDER — ALBUTEROL SULFATE HFA 108 (90 BASE) MCG/ACT IN AERS: 2.0000 | INHALATION_SPRAY | Freq: Four times a day (QID) | RESPIRATORY_TRACT | 0 refills | Status: DC | PRN

## 2022-09-12 NOTE — Progress Notes (Signed)
Subjective:     Patient ID: Angel Morris, female   DOB: 1976-02-09, 47 y.o.   MRN: [id number]  This visit type was conducted due to national recommendations for restrictions regarding the COVID-19 Pandemic (e.g. social distancing) in an effort to limit this patient's exposure and mitigate transmission in our community.  Due to their co-morbid illnesses, this patient is at least at moderate risk for complications without adequate follow up.  This format is felt to be most appropriate for this patient at this time.    Documentation for virtual audio and video telecommunications through [facility] encounter:  The patient was located at home. The provider was located in the office. The patient did consent to this visit and is aware of possible charges through their insurance for this visit.  The other persons participating in this telemedicine service were none. Time spent on call was 20 minutes and in review of previous records 20 minutes total.  This virtual service is not related to other E/M service within previous 7 days.   HPI Chief Complaint  Patient presents with   other    ST, cough, body aches, got sick during during Christmas, a lot congestion, nose was topped up, started again last Wednesday ST and fever, body was achy, 101 temp. Now has a cough and SOB mucous in throat,    Virtual for illness.  Around Christmas time had illness, cold symptoms, head congestion, cough, but those symptoms got better.   Then last week about 6 days ago started feeling ill suddenly.  In the past week she has had fever up to 101 but no fever in the last day or 2, body aches and chills worse at night, head and chest congestion, heaviness in the chest, cough, some sore throat.  No nausea vomiting or diarrhea.  No wheezing or shortness of breath.  She has an underlying history of asthma mostly in childhood.  Does not currently have inhaler at home.  Overall feels better than she did a week ago but 1 to  make sure there was nothing else going on that she need to do something about.  She is taking TheraFlu, Airborne vitamins over-the-counter and good water intake.  Using ibuprofen and Tylenol for fever and not feeling well.  No other aggravating or relieving factors. No other complaint.  Past Medical History:  Diagnosis Date   Abnormal pap    lSIL in the past   Anemia, iron deficiency    in past, not as of 05/2016   Anxiety    Asthma    childhood   Complication of anesthesia    States "when I had kidney stones removed(Open); I had a build up of fluid in my lungs. But I dont really remember much about it otherwise".   Herpes 04/05/2016   History of pyelonephritis    History of recurrent UTIs    Human papilloma virus    Hypertension    Menorrhagia    Nephrolithiasis    Overweight(278.02)    Uterine fibroid    Vaginal Pap smear, abnormal    Wears contact lenses    Current Outpatient Medications on File Prior to Visit  Medication Sig Dispense Refill   amLODipine (NORVASC) 10 MG tablet Take 1 tablet (10 mg total) by mouth daily. 90 tablet 3   Cholecalciferol (VITAMIN D) 50 MCG (2000 UT) CAPS Take 1 capsule (2,000 Units total) by mouth daily. 90 capsule 3   Norethindrone-Ethinyl Estradiol-Fe Biphas (LO LOESTRIN FE) 1 MG-10 MCG /  10 MCG tablet Take 1 tablet by mouth daily. Take 1 daily by mouth 84 tablet 3   ibuprofen (ADVIL) 800 MG tablet Take 1 tablet (800 mg total) by mouth every 8 (eight) hours as needed. (Patient not taking: Reported on 09/12/2022) 60 tablet 1   No current facility-administered medications on file prior to visit.   Review of Systems As in subjective      Objective:   Physical Exam Due to coronavirus pandemic stay at home measures, patient visit was virtual and they were not examined in person.   BP 128/86   Pulse 70   Temp 99.8 F (37.7 C)   Ht '5\' 4"'$  (1.626 m)   Wt 172 lb 9.6 oz (78.3 kg)   SpO2 98%   BMI 29.63 kg/m   Wt Readings from Last 3  Encounters:  09/12/22 172 lb 9.6 oz (78.3 kg)  07/28/22 171 lb 9.6 oz (77.8 kg)  06/30/22 172 lb 3.2 oz (78.1 kg)   General: Well-developed well-nourished no acute stress Not terribly ill-appearing No wheezing or labored breathing      Assessment:     Encounter Diagnoses  Name Primary?   Viral syndrome Yes   Cough, unspecified type        Plan:     We discussed symptoms and concerns to suggest that she is had a flu like syndrome or viral syndrome.  We discussed limitation of virtual consult.  I offered evaluation in person to listen to her lungs and get some vital signs.  She will report to our office here shortly so I can examine her in person  I suspect she is improving and that her symptoms reflect the typical course of influenza, but she wanted some reassurance  Continue good hydration, vitamins, and use TheraFlu a few more days if needed, ibuprofen and Tylenol alternating for fever or not feeling well although fever resolved a few days ago  Addendum: She came in person and her vitals reviewed, she was examined.  Continue with same recommendations as above  Angel Morris was seen today for other.  Diagnoses and all orders for this visit:  Viral syndrome  Cough, unspecified type  Other orders -     albuterol (VENTOLIN HFA) 108 (90 Base) MCG/ACT inhaler; Inhale 2 puffs into the lungs every 6 (six) hours as needed for wheezing or shortness of breath.  F/u prn

## 2022-09-18 ENCOUNTER — Other Ambulatory Visit: Payer: Self-pay | Admitting: Adult Health

## 2022-09-18 MED ORDER — METRONIDAZOLE 500 MG PO TABS: 500.0000 mg | ORAL_TABLET | Freq: Two times a day (BID) | ORAL | 0 refills | Status: DC

## 2022-09-18 NOTE — Progress Notes (Signed)
Rx flagyl

## 2022-10-09 ENCOUNTER — Encounter: Payer: Self-pay | Admitting: Medical

## 2022-10-09 ENCOUNTER — Other Ambulatory Visit: Payer: Self-pay | Admitting: Medical

## 2022-10-09 ENCOUNTER — Telehealth: Payer: Self-pay | Admitting: Medical

## 2022-10-09 ENCOUNTER — Other Ambulatory Visit (INDEPENDENT_AMBULATORY_CARE_PROVIDER_SITE_OTHER): Payer: BC Managed Care – PPO

## 2022-10-09 ENCOUNTER — Telehealth (INDEPENDENT_AMBULATORY_CARE_PROVIDER_SITE_OTHER): Payer: BC Managed Care – PPO | Admitting: Medical

## 2022-10-09 VITALS — Temp 99.2°F | Wt 174.0 lb

## 2022-10-09 DIAGNOSIS — R051 Acute cough: Secondary | ICD-10-CM

## 2022-10-09 DIAGNOSIS — R509 Fever, unspecified: Secondary | ICD-10-CM

## 2022-10-09 DIAGNOSIS — J4520 Mild intermittent asthma, uncomplicated: Secondary | ICD-10-CM | POA: Diagnosis not present

## 2022-10-09 DIAGNOSIS — J029 Acute pharyngitis, unspecified: Secondary | ICD-10-CM | POA: Diagnosis not present

## 2022-10-09 LAB — POCT INFLUENZA A/B
Influenza A, POC: NEGATIVE
Influenza B, POC: NEGATIVE

## 2022-10-09 LAB — POC COVID19 BINAXNOW: SARS Coronavirus 2 Ag: POSITIVE — AB

## 2022-10-09 MED ORDER — EMERGEN-C IMMUNE PLUS PO PACK: 1.0000 | PACK | Freq: Two times a day (BID) | ORAL | 0 refills | Status: DC

## 2022-10-09 MED ORDER — FLUTICASONE-SALMETEROL 115-21 MCG/ACT IN AERO: 2.0000 | INHALATION_SPRAY | Freq: Two times a day (BID) | RESPIRATORY_TRACT | 1 refills | Status: DC

## 2022-10-09 MED ORDER — HYDROCOD POLI-CHLORPHE POLI ER 10-8 MG/5ML PO SUER: 5.0000 mL | Freq: Two times a day (BID) | ORAL | 0 refills | Status: DC

## 2022-10-09 NOTE — Patient Instructions (Signed)
Covid infection, covid illness general recommendations:  I recommend you rest, hydrate well with water and clear fluids throughout the day.  If you feel dry in the mouth, tongue or feel that you are urinating as much as usual, then increase hydration.  You urine should be like yellow to clear, not dark yellow or darker.     Pain, body aches, or fever: You can use Tylenol /Acetaminophen 34m over the counter for pain or fever, every 4-6 hours,  OR ....  You can use Ibuprofen 2026mover the counter, 3 tablets either 2 or 3 times daily for pain or fever.    OR ..... You can alternate tylenol and ibuprofen every 4-6 hours   Cough: You can use Tussionex prescription medicaiton for cough.   This medication can cause drowsiness so use caution. Don't take this medication and drive or operate machinery.   Drainage and congestion: You can use Mucinex/Guaifenesin as directed on the label if needed for thicker mucous   Shortness of breath or wheezing: You can use you albuterol inhaler.  You can use 1-2 puffs every 4 hours as needed for wheezing, shortness of breath, chest tightness, or coughing fits.  Caution as this medication can cause jitteriness/shakes.  Begin back on Advair for the next few weeks, twice daily maintenance inhaler to help with cough, chest tightness.   Rinse mouth out with water after use.    Nausea: You can use over the counter Emetrol for nausea.     Antiviral medication: We discussed possible use of medication Paxlovid to help reduce your risk of hospitalization or severe illness.  After our discussion, you decided to hold off on this for now.   Other supportive measures: I recommend extra vitamins to help your body fight the illness.   Consider over the counter vitamin pack such as EmergenC Immune Plus which contains extra vitamin C, vitamin D and zinc.     In the next few days, if you are having trouble breathing, if you are very weak, have high fever 103 or  higher consistently despite Tylenol, or uncontrollable nausea and vomiting, then call or go to the emergency department.    If you have other questions or have other symptoms or questions you are concerned about then please make a virtual visit   Covid symptoms such as fatigue and cough can linger over 2 weeks, even after the initial fever, aches, chills, and other initial symptoms.   Self Quarantine: The CDC, Centers for Disease Control has recommended a self quarantine of 5 days from the start of your illness until you are symptom-free including at least 24 hours of no symptoms including no fever, no shortness of breath, and no body aches and chills, by day 5 before returning to work or general contact with the public.  What does self quarantine mean: avoiding contact with people as much as possible.   Particularly in your house, isolate your self from others in a separate room, wear a mask when possible in the room, particularly if coughing a lot.   Have others bring food, water, medications, etc., to your door, but avoid direct contact with your household contacts during this time to avoid spreading the infection to them.   If you have a separate bathroom and living quarters during the next 2 weeks away from others, that would be preferable.    If you can't completely isolate, then wear a mask, wash hands frequently with soap and water for at least 15 seconds,  minimize close contact with others, and have a friend or family member check regularly from a distance to make sure you are not getting seriously worse.     You should not be going out in public, should not be going to stores, to work or other public places until all your symptoms have resolved and at least 5 days + 24 hours of no symptoms at all have transpired.   Ideally you should avoid contact with others for a full 5 days if possible.  One of the goals is to limit spread to high risk people; people that are older and elderly, people with  multiple health issues like diabetes, heart disease, lung disease, and anybody that has weakened immune systems such as people with cancer or on immunosuppressive therapy.

## 2022-10-09 NOTE — Progress Notes (Signed)
Subjective:     Patient ID: Angel Morris, female   DOB: May 28, 1976, 47 y.o.   MRN: [id number]  This visit type was conducted due to national recommendations for restrictions regarding the COVID-19 Pandemic (e.g. social distancing) in an effort to limit this patient's exposure and mitigate transmission in our community.  Due to their co-morbid illnesses, this patient is at least at moderate risk for complications without adequate follow up.  This format is felt to be most appropriate for this patient at this time.    Documentation for virtual audio and video telecommunications through [facility] encounter:  The patient was located at home. The provider was located in the office. The patient did consent to this visit and is aware of possible charges through their insurance for this visit.  The other persons participating in this telemedicine service were none. Time spent on call was 20 minutes and in review of previous records 20 minutes total.  This virtual service is not related to other E/M service within previous 7 days.   HPI Chief Complaint  Patient presents with   sick    Cough- fever 103.4 Saturday, runny nose, congestion, weakness- body aches. Symptoms started thursday   Virtual for illness.  She awoke this past Thursday 4 days ago feeling terrible, had sore throat, body aches.  Since then still having congestion, fever, weakness, body aches, fever has been up to 102-103.  Fever finally broke Saturday 2 days ago.  Has bad cough, sore throat, initially then nasal congestion and runny nose in last 2 days.    Feels a little better today, but still feels some symptom and weakness.  No recent covid test.  Has sick contacts at work.    Currently using Theraflu, has used some tylenol cold and flu severe.  Water intake is not the best.  Didn't eat any food from Friday to yesterday Sunday.   Has been doing some orange juice and water.  Did eat some foot this morning.  Hasn't used  inhaler recently.    Had been having some coughing fits occasionally in recent weeks.  Was sick with viral illness last month as well.   No other aggravating or relieving factors. No other complaint.   Past Medical History:  Diagnosis Date   Abnormal pap    lSIL in the past   Anemia, iron deficiency    in past, not as of 05/2016   Anxiety    Asthma    childhood   Complication of anesthesia    States "when I had kidney stones removed(Open); I had a build up of fluid in my lungs. But I dont really remember much about it otherwise".   Herpes 04/05/2016   History of pyelonephritis    History of recurrent UTIs    Human papilloma virus    Hypertension    Menorrhagia    Nephrolithiasis    Overweight(278.02)    Uterine fibroid    Vaginal Pap smear, abnormal    Wears contact lenses    Current Outpatient Medications on File Prior to Visit  Medication Sig Dispense Refill   albuterol (VENTOLIN HFA) 108 (90 Base) MCG/ACT inhaler Inhale 2 puffs into the lungs every 6 (six) hours as needed for wheezing or shortness of breath. 18 g 0   amLODipine (NORVASC) 10 MG tablet Take 1 tablet (10 mg total) by mouth daily. 90 tablet 3   Cholecalciferol (VITAMIN D) 50 MCG (2000 UT) CAPS Take 1 capsule (2,000 Units total) by mouth daily.  90 capsule 3   Norethindrone-Ethinyl Estradiol-Fe Biphas (LO LOESTRIN FE) 1 MG-10 MCG / 10 MCG tablet Take 1 tablet by mouth daily. Take 1 daily by mouth 84 tablet 3   ibuprofen (ADVIL) 800 MG tablet Take 1 tablet (800 mg total) by mouth every 8 (eight) hours as needed. (Patient not taking: Reported on 09/12/2022) 60 tablet 1   No current facility-administered medications on file prior to visit.    Review of Systems As in subjective    Objective:   Physical Exam Due to coronavirus pandemic stay at home measures, patient visit was virtual and they were not examined in person.   Temp 99.2 F (37.3 C)   Wt 174 lb (78.9 kg)   BMI 29.87 kg/m   Gen: wd, wn, nad Not  particularly ill appearing No wheezing or labored breathing       Assessment:     Encounter Diagnoses  Name Primary?   Fever, unspecified fever cause Yes   Acute cough    Mild intermittent asthma without complication    Sore throat        Plan:     We discussed symptoms, concerns, limitation of virtual consult.  Symptoms suggest viral syndrome  She will drive up to our office to the back parking lot for testing for COVID and flu.  She is on day 4 symptoms.  Advised supportive care, rest, hydration, can continue TheraFlu over-the-counter, Tylenol or ibuprofen for fever and not feeling well, body aches.  Increase hydration in general as she is not drinking enough fluids, in particular water  Given some of the ongoing cough she has had intermittent for a few weeks this could be related to asthma flare in general.  Start back on maintenance inhaler for at least the next week or 2 to see if this helps  Follow-up here shortly for testing   Addendum: Positive for COVID on testing today, negative for flu  Covid infection, covid illness general recommendations:  I recommend you rest, hydrate well with water and clear fluids throughout the day.  If you feel dry in the mouth, tongue or feel that you are urinating as much as usual, then increase hydration.  You urine should be like yellow to clear, not dark yellow or darker.     Pain, body aches, or fever: You can use Tylenol /Acetaminophen 373m over the counter for pain or fever, every 4-6 hours,  OR ....  You can use Ibuprofen 2047mover the counter, 3 tablets either 2 or 3 times daily for pain or fever.    OR ..... You can alternate tylenol and ibuprofen every 4-6 hours   Cough: You can use Tussionex prescription medicaiton for cough.   This medication can cause drowsiness so use caution. Don't take this medication and drive or operate machinery.   Drainage and congestion: You can use Mucinex/Guaifenesin as directed on  the label if needed for thicker mucous   Shortness of breath or wheezing: You can use you albuterol inhaler.  You can use 1-2 puffs every 4 hours as needed for wheezing, shortness of breath, chest tightness, or coughing fits.  Caution as this medication can cause jitteriness/shakes.  Begin back on Advair for the next few weeks, twice daily maintenance inhaler to help with cough, chest tightness.   Rinse mouth out with water after use.    Nausea: You can use over the counter Emetrol for nausea.     Antiviral medication: We discussed possible use of medication Paxlovid  to help reduce your risk of hospitalization or severe illness.  After our discussion, you decided to hold off on this for now.   Other supportive measures: I recommend extra vitamins to help your body fight the illness.   Consider over the counter vitamin pack such as EmergenC Immune Plus which contains extra vitamin C, vitamin D and zinc.     In the next few days, if you are having trouble breathing, if you are very weak, have high fever 103 or higher consistently despite Tylenol, or uncontrollable nausea and vomiting, then call or go to the emergency department.    If you have other questions or have other symptoms or questions you are concerned about then please make a virtual visit   Covid symptoms such as fatigue and cough can linger over 2 weeks, even after the initial fever, aches, chills, and other initial symptoms.   Self Quarantine: The CDC, Centers for Disease Control has recommended a self quarantine of 5 days from the start of your illness until you are symptom-free including at least 24 hours of no symptoms including no fever, no shortness of breath, and no body aches and chills, by day 5 before returning to work or general contact with the public.  What does self quarantine mean: avoiding contact with people as much as possible.   Particularly in your house, isolate your self from others in a separate  room, wear a mask when possible in the room, particularly if coughing a lot.   Have others bring food, water, medications, etc., to your door, but avoid direct contact with your household contacts during this time to avoid spreading the infection to them.   If you have a separate bathroom and living quarters during the next 2 weeks away from others, that would be preferable.    If you can't completely isolate, then wear a mask, wash hands frequently with soap and water for at least 15 seconds, minimize close contact with others, and have a friend or family member check regularly from a distance to make sure you are not getting seriously worse.     You should not be going out in public, should not be going to stores, to work or other public places until all your symptoms have resolved and at least 5 days + 24 hours of no symptoms at all have transpired.   Ideally you should avoid contact with others for a full 5 days if possible.  One of the goals is to limit spread to high risk people; people that are older and elderly, people with multiple health issues like diabetes, heart disease, lung disease, and anybody that has weakened immune systems such as people with cancer or on immunosuppressive therapy.        Raneshia was seen today for sick.  Diagnoses and all orders for this visit:  Fever, unspecified fever cause  Acute cough  Mild intermittent asthma without complication  Sore throat  Other orders -     fluticasone-salmeterol (ADVAIR HFA) 115-21 MCG/ACT inhaler; Inhale 2 puffs into the lungs 2 (two) times daily. -     chlorpheniramine-HYDROcodone (TUSSIONEX) 10-8 MG/5ML; Take 5 mLs by mouth 2 (two) times daily. -     Multiple Vitamins-Minerals (EMERGEN-C IMMUNE PLUS) PACK; Take 1 tablet by mouth 2 (two) times daily.  F/u prn

## 2022-10-09 NOTE — Telephone Encounter (Signed)
Pt called and states the tussionex is out of stock at her pharmacy but is in stock at [facility] please send to [facility]. [Pt can be reached  at [phone].

## 2022-12-29 ENCOUNTER — Other Ambulatory Visit: Payer: Self-pay | Admitting: Medical

## 2023-02-20 ENCOUNTER — Other Ambulatory Visit (HOSPITAL_COMMUNITY)
Admission: RE | Admit: 2023-02-20 | Discharge: 2023-02-20 | Disposition: A | Discharge status: Home / Self Care | Payer: BC Managed Care – PPO | Source: Ambulatory Visit | Attending: Adult Health | Admitting: Adult Health

## 2023-02-20 ENCOUNTER — Ambulatory Visit (INDEPENDENT_AMBULATORY_CARE_PROVIDER_SITE_OTHER): Payer: BC Managed Care – PPO | Admitting: Adult Health

## 2023-02-20 ENCOUNTER — Encounter: Payer: Self-pay | Admitting: Adult Health

## 2023-02-20 VITALS — BP 139/89 | HR 64 | Ht 64.0 in | Wt 175.0 lb

## 2023-02-20 DIAGNOSIS — N760 Acute vaginitis: Secondary | ICD-10-CM | POA: Insufficient documentation

## 2023-02-20 DIAGNOSIS — B9689 Other specified bacterial agents as the cause of diseases classified elsewhere: Secondary | ICD-10-CM | POA: Insufficient documentation

## 2023-02-20 DIAGNOSIS — Z113 Encounter for screening for infections with a predominantly sexual mode of transmission: Secondary | ICD-10-CM | POA: Diagnosis not present

## 2023-02-20 DIAGNOSIS — N898 Other specified noninflammatory disorders of vagina: Secondary | ICD-10-CM | POA: Insufficient documentation

## 2023-02-20 NOTE — Progress Notes (Signed)
  Subjective:     Patient ID: Angel Morris, female   DOB: 09-16-1975, 47 y.o.   MRN: [id number]  HPI Angel Morris is a 47 year old black female, divorced, [id number] in complaining of pink watery discharge with slight odor, has missed some periods.     Component Value Date/Time   DIAGPAP  10/01/2020 1053    - Negative for intraepithelial lesion or malignancy (NILM)   DIAGPAP  02/18/2018 0000    NEGATIVE FOR INTRAEPITHELIAL LESIONS OR MALIGNANCY.   DIAGPAP SHIFT IN [place] SUGGESTIVE OF BACTERIAL VAGINOSIS. 02/18/2018 0000   HPVHIGH Negative 10/01/2020 1053   ADEQPAP  10/01/2020 1053    Satisfactory for evaluation; transformation zone component ABSENT.   ADEQPAP  02/18/2018 0000    Satisfactory for evaluation  endocervical/transformation zone component ABSENT.   PCP is D Environmental health practitioner PA  Review of Systems Pink watery discharge, with slight odor Has missed some periods on OC Denies any problem with urination, bowel movements or sex. Reviewed past medical,surgical, social and family history. Reviewed medications and allergies.     Objective:   Physical Exam BP 139/89 (BP Location: Right Arm, Patient Position: Sitting, Cuff Size: Normal)   Pulse 64   Ht 5\' 4"  (1.626 m)   Wt 175 lb (79.4 kg)   LMP 01/10/2023 (Approximate)   BMI 30.04 kg/m     Skin warm and dry.Pelvic: external genitalia is normal in appearance no lesions, vagina: creamy discharge with odor,urethra has no lesions or masses noted, cervix: bulbous, uterus: normal size, shape and contour, non tender, no masses felt, adnexa: no masses or tenderness noted. Bladder is non tender and no masses felt. CV swab obtained.  Upstream - 02/20/23 1613       Pregnancy Intention Screening   Does the patient want to become pregnant in the next year? No    Does the patient's partner want to become pregnant in the next year? No    Would the patient like to discuss contraceptive options today? No      Contraception Wrap Up   Current Method  Oral Contraceptive    End Method Oral Contraceptive    Contraception Counseling Provided No            Examination chaperoned by Faith Rogue LPN  Assessment:     1. Vaginal discharge +pink watery discharge with odor Has missed some periods on lo Loestrin Will talk when results back   CV swab sent for GC/CHL,trich,BV and yeast  - Cervicovaginal ancillary only( [place])  2. Vaginal odor +Slight odor CV swab sent  - Cervicovaginal ancillary only( [place])     Plan:     Follow up prn

## 2023-02-23 ENCOUNTER — Other Ambulatory Visit: Payer: Self-pay | Admitting: Adult Health

## 2023-02-23 LAB — CERVICOVAGINAL ANCILLARY ONLY
Bacterial Vaginitis (gardnerella): POSITIVE — AB
Chlamydia: NEGATIVE
Comment: NEGATIVE
Comment: NEGATIVE
Comment: NEGATIVE
Comment: NORMAL
Neisseria Gonorrhea: NEGATIVE
Trichomonas: NEGATIVE

## 2023-02-23 MED ORDER — METRONIDAZOLE 500 MG PO TABS: 500.0000 mg | ORAL_TABLET | Freq: Two times a day (BID) | ORAL | 0 refills | Status: DC

## 2023-02-23 NOTE — Progress Notes (Signed)
+  BV on vaginal swab, will rx flagyl, no sex or alcohol while taking  

## 2023-03-27 ENCOUNTER — Ambulatory Visit: Payer: BC Managed Care – PPO | Admitting: Medical

## 2023-03-27 ENCOUNTER — Encounter: Payer: Self-pay | Admitting: Medical

## 2023-03-27 VITALS — BP 110/70 | HR 71 | Ht 64.0 in | Wt 175.6 lb

## 2023-03-27 DIAGNOSIS — R42 Dizziness and giddiness: Secondary | ICD-10-CM

## 2023-03-27 DIAGNOSIS — R519 Headache, unspecified: Secondary | ICD-10-CM | POA: Diagnosis not present

## 2023-03-27 MED ORDER — MECLIZINE HCL 25 MG PO TABS: 25.0000 mg | ORAL_TABLET | Freq: Two times a day (BID) | ORAL | 0 refills | Status: DC

## 2023-03-27 NOTE — Progress Notes (Signed)
Subjective:  Angel Morris is a 47 y.o. female who presents for Chief Complaint  Patient presents with   Headache    Headache and dizziness that started Sunday, Started feeling better until she bent over at work today to [employer] and she thought about the vertigo she had in the past. Her bp last night was 124/84. Given meclizine in 2020 and 2022 and it worked well.      Here for headache, dizziness. Started 2 days ago.   Was getting some better, then at work today bent down to sweep and had some vertigo.   Sunday 2 days ago sitting watching tv started getting dizzy like room spinning.  Shortly thereafter, went to life down.  Any time she went to get up got dizzy.   Headache came later, not bad headache but mild dull headaches.  Still has headache intermittent last few days. Headache central in front of face.  Took a sudafed yesterday for potential sinus pressure.    Felt dizzy all day yesterday.    No chest pain, no palpations, no syncope, no slurred speech, no vision or hearing changes, no new numbness or tingling, no weakness.  No ringing in the ear or hearing loss.    No runny nose, sneezing, sore throat, no cough.    Did some ibuprofen Sunday and it helped a little.    No recent low pressure readings.   No recent elevation changes, plane rides or activity to trigger vertigo.  No other aggravating or relieving factors.    No other c/o.  Past Medical History:  Diagnosis Date   Abnormal pap    lSIL in the past   Anemia, iron deficiency    in past, not as of 05/2016   Anxiety    Asthma    childhood   Complication of anesthesia    States "when I had kidney stones removed(Open); I had a build up of fluid in my lungs. But I dont really remember much about it otherwise".   Herpes 04/05/2016   History of pyelonephritis    History of recurrent UTIs    Human papilloma virus    Hypertension    Menorrhagia    Nephrolithiasis    Overweight(278.02)    Uterine fibroid    Vaginal  Pap smear, abnormal    Wears contact lenses    Current Outpatient Medications on File Prior to Visit  Medication Sig Dispense Refill   amLODipine (NORVASC) 10 MG tablet Take 1 tablet (10 mg total) by mouth daily. 90 tablet 3   Cholecalciferol (VITAMIN D) 50 MCG (2000 UT) CAPS Take 1 capsule (2,000 Units total) by mouth daily. 90 capsule 3   Norethindrone-Ethinyl Estradiol-Fe Biphas (LO LOESTRIN FE) 1 MG-10 MCG / 10 MCG tablet Take 1 tablet by mouth daily. Take 1 daily by mouth 84 tablet 3   phenylephrine (SUDAFED PE) 10 MG TABS tablet Take 10 mg by mouth every 4 (four) hours as needed.     albuterol (VENTOLIN HFA) 108 (90 Base) MCG/ACT inhaler TAKE 2 PUFFS BY MOUTH EVERY 6 HOURS AS NEEDED FOR WHEEZE OR SHORTNESS OF BREATH (Patient not taking: Reported on 03/27/2023) 8.5 each 0   fluticasone-salmeterol (ADVAIR HFA) 115-21 MCG/ACT inhaler Inhale 2 puffs into the lungs 2 (two) times daily. (Patient not taking: Reported on 03/27/2023) 1 each 1   ibuprofen (ADVIL) 800 MG tablet Take 1 tablet (800 mg total) by mouth every 8 (eight) hours as needed. (Patient not taking: Reported on 09/12/2022) 60 tablet  1   No current facility-administered medications on file prior to visit.     The following portions of the patient's history were reviewed and updated as appropriate: allergies, current medications, past family history, past medical history, past social history, past surgical history and problem list.  ROS Otherwise as in subjective above    Objective: BP 110/70   Pulse 71   Ht 5\' 4"  (1.626 m)   Wt 175 lb 9.6 oz (79.7 kg)   SpO2 96%   BMI 30.14 kg/m   Wt Readings from Last 3 Encounters:  03/27/23 175 lb 9.6 oz (79.7 kg)  02/20/23 175 lb (79.4 kg)  10/09/22 174 lb (78.9 kg)   BP Readings from Last 3 Encounters:  03/27/23 110/70  02/20/23 139/89  09/12/22 128/86    General appearance: alert, no distress, well developed, well nourished HEENT: normocephalic, sclerae anicteric,  conjunctiva pink and moist, TMs flat with somewhat decreased light reflex,, nares patent, no discharge or erythema, pharynx normal Oral cavity: MMM, no lesions Neck: supple, no lymphadenopathy, no thyromegaly, no masses Heart: RRR, normal S1, S2, no murmurs Lungs: CTA bilaterally, no wheezes, rhonchi, or rales Pulses: 2+ radial pulses, 2+ pedal pulses, normal cap refill Ext: no edema Neuro: CN II through XII intact, nonfocal exam   Assessment: Encounter Diagnoses  Name Primary?   Vertigo Yes   Nonintractable headache, unspecified chronicity pattern, unspecified headache type      Plan: We discussed differential.  Symptoms suggest vertigo versus early eustachian tube dysfunction.  Begin meclizine short-term.  Stop Sudafed.  Drink plenty of fluids throughout the day and avoid making sudden motion.  If not much improved within the next 2 to 3 days call back.  If any new symptoms that are worrisome as discussed such as stroke symptoms, or other changes neurologically then call 911 or get reevaluated right away.  No signs of TIA or stroke today.   Angel Morris was seen today for headache.  Diagnoses and all orders for this visit:  Vertigo  Nonintractable headache, unspecified chronicity pattern, unspecified headache type  Other orders -     meclizine (ANTIVERT) 25 MG tablet; Take 1 tablet (25 mg total) by mouth 2 (two) times daily.    Follow up: prn

## 2023-05-31 ENCOUNTER — Other Ambulatory Visit: Payer: Self-pay | Admitting: Adult Health

## 2023-08-03 ENCOUNTER — Encounter: Payer: Self-pay | Admitting: Medical

## 2023-08-03 ENCOUNTER — Ambulatory Visit (INDEPENDENT_AMBULATORY_CARE_PROVIDER_SITE_OTHER): Payer: BC Managed Care – PPO | Admitting: Medical

## 2023-08-03 VITALS — BP 120/70 | HR 76 | Ht 64.5 in | Wt 174.8 lb

## 2023-08-03 DIAGNOSIS — Z0000 Encounter for general adult medical examination without abnormal findings: Secondary | ICD-10-CM

## 2023-08-03 DIAGNOSIS — M545 Low back pain, unspecified: Secondary | ICD-10-CM | POA: Diagnosis not present

## 2023-08-03 DIAGNOSIS — G8929 Other chronic pain: Secondary | ICD-10-CM

## 2023-08-03 DIAGNOSIS — Z136 Encounter for screening for cardiovascular disorders: Secondary | ICD-10-CM

## 2023-08-03 DIAGNOSIS — R0602 Shortness of breath: Secondary | ICD-10-CM | POA: Diagnosis not present

## 2023-08-03 DIAGNOSIS — Z1231 Encounter for screening mammogram for malignant neoplasm of breast: Secondary | ICD-10-CM | POA: Diagnosis not present

## 2023-08-03 DIAGNOSIS — E559 Vitamin D deficiency, unspecified: Secondary | ICD-10-CM

## 2023-08-03 DIAGNOSIS — R7301 Impaired fasting glucose: Secondary | ICD-10-CM | POA: Diagnosis not present

## 2023-08-03 NOTE — Patient Instructions (Addendum)
Please call to schedule your mammogram.   [facility]  [phone], [address] [place], [place] [address]     Please go to [facility] for your lumbar spine xray.   Their hours are 8am - 4:30 pm Monday - Friday.  Take your insurance card with you.  [facility] [phone]   [address] [place], [place] [address]      Today you had a preventative care visit or wellness visit.    Topics today may have included healthy lifestyle, diet, exercise, preventative care, vaccinations, sick and well care, proper use of emergency dept and after hours care, as well as other concerns.     Recommendations: Continue to return yearly for your annual wellness and preventative care visits.  This gives [place] a chance to discuss healthy lifestyle, exercise, vaccinations, review your chart record, and perform screenings where appropriate.  I recommend you see your eye doctor yearly for routine vision care.  I recommend you see your dentist yearly for routine dental care including hygiene visits twice yearly.  See your gynecologist yearly for routine gynecological care.    Vaccination recommendations were reviewed Immunization History  Administered Date(s) Administered   H1N1 07/31/2008   Influenza Split 05/20/2021, 04/28/2022, 06/23/2023   Influenza,inj,Quad PF,6+ Mos 06/08/2016   Influenza-Unspecified 05/17/2017, 06/18/2019, 05/24/2020   PFIZER(Purple Top)SARS-COV-2 Vaccination 02/07/2020, 03/01/2020   Tdap 12/15/2010, 07/28/2021     Screening for cancer: Breast cancer screening: You should perform a self breast exam monthly.   Continue routine mammograms  Please call to schedule your screening mammogram.   [facility]  [phone] [address], [address] [place], [place] [address]   Colon cancer screening:  Reviewed 2023 colonoscopy report  Cervical cancer screening: We reviewed  recommendations for pap smear screening.  Sees gyn  Skin cancer screening: Check your skin regularly for new changes, growing lesions, or other lesions of concern Come in for evaluation if you have skin lesions of concern.  We currently don't have screenings for other cancers besides breast, cervical, colon, and lung cancers.  If you have a strong family history of cancer or have other cancer screening concerns, please let me know.    Bone health: Get at least 150 minutes of aerobic exercise weekly Get weight bearing exercise at least once weekly   Heart health: Get at least 150 minutes of aerobic exercise weekly Limit alcohol It is important to maintain a healthy blood pressure and healthy cholesterol numbers  Consider the CT coronary heart test   Separate significant issues discussed: Asthma Continue albuterol rescue inhaler as needed for cough, wheezing, or shortness of breath If you have periods of time where you need to use inhaler daily or at least 3-4 days per week, then we should consider adding a second "preventative" long acting inhaler to help symptoms.  Examples would include Advair, Symbicort, Dulera or other.   Hypertension-continue current medication Amlodipine 10mg  daily  Continue vitamin D supplement  On OCPs per gyn  Back pain-Go for baseline x-ray

## 2023-08-03 NOTE — Progress Notes (Signed)
Subjective:   HPI  Angel Morris is a 47 y.o. female who presents for a complete physical.  Medical team: [facility], Cyril Mourning NP [facility] for dentist Eye doctor Dr. Harmon Pier, [facility] Dr. Tiajuana Amass, GI Michelena Culmer, Kermit Balo, PA-C here for primary care   Concerns: Need physical form completed for work  HTN - compliant with medication  Asthma - usually fine but having to use inhaler some albuterol the past week  Currently her son and young grandchildren are living with her due to some stressful situation currently.    Reviewed their medical, surgical, family, social, medication, and allergy history and updated chart as appropriate.  Past Medical History:  Diagnosis Date   Abnormal pap    lSIL in the past   Anemia, iron deficiency    in past, not as of 05/2016   Anxiety    Asthma    childhood   Complication of anesthesia    States "when I had kidney stones removed(Open); I had a build up of fluid in my lungs. But I dont really remember much about it otherwise".   Herpes 04/05/2016   History of pyelonephritis    History of recurrent UTIs    Human papilloma virus    Hypertension    Menorrhagia    Nephrolithiasis    Overweight(278.02)    Uterine fibroid    Vaginal Pap smear, abnormal    Wears contact lenses     Past Surgical History:  Procedure Laterality Date   CERVICAL CONIZATION W/BX N/A 10/08/2013   Procedure: CONIZATION CERVIX WITH BIOPSY;  Surgeon: Lazaro Arms, MD;  Location: [facility];  Service: Gynecology;  Laterality: N/A;   DILATION AND CURETTAGE OF UTERUS     DILITATION & CURRETTAGE/HYSTROSCOPY WITH NOVASURE ABLATION N/A 07/02/2015   Procedure: DILATATION & CURETTAGE/HYSTEROSCOPY WITH NOVASURE ABLATION;  Surgeon: Lazaro Arms, MD;  Location: [facility];  Service: Gynecology;  Laterality: N/A;  Uterine Cavity Length 5.5 Uterine Cavity Width 3.5 Power 106 Time 1 minute 4 seconds   HOLMIUM LASER APPLICATION N/A 10/08/2013    Procedure: HOLMIUM LASER APPLICATION;  Surgeon: Lazaro Arms, MD;  Location: [facility];  Service: Gynecology;  Laterality: N/A;   kidney stones  june 2000   open removal   rotary cuff - left shoulder  2005     Family History  Problem Relation Age of Onset   Fibromyalgia Mother    Arthritis Mother        RA   Hypertension Mother    Diabetes Mother    Diabetes Father        diabetes, amputation   Heart disease Father 85       died of MI 01/28/17   Kidney disease Father        dialysis   Diabetes Brother    Hypertension Brother    Stroke Maternal Grandmother    Aneurysm Maternal Grandfather    Stroke Maternal Grandfather    Diabetes Son    Cancer Neg Hx    Colon polyps Neg Hx    Rectal cancer Neg Hx    Stomach cancer Neg Hx      Current Outpatient Medications:    albuterol (VENTOLIN HFA) 108 (90 Base) MCG/ACT inhaler, TAKE 2 PUFFS BY MOUTH EVERY 6 HOURS AS NEEDED FOR WHEEZE OR SHORTNESS OF BREATH, Disp: 8.5 each, Rfl: 0   amLODipine (NORVASC) 10 MG tablet, Take 1 tablet (10 mg total) by mouth daily., Disp: 90 tablet, Rfl:  3   Cholecalciferol (VITAMIN D) 50 MCG (2000 UT) CAPS, Take 1 capsule (2,000 Units total) by mouth daily., Disp: 90 capsule, Rfl: 3   fluticasone-salmeterol (ADVAIR HFA) 115-21 MCG/ACT inhaler, Inhale 2 puffs into the lungs 2 (two) times daily., Disp: 1 each, Rfl: 1   ibuprofen (ADVIL) 800 MG tablet, Take 1 tablet (800 mg total) by mouth every 8 (eight) hours as needed., Disp: 60 tablet, Rfl: 1   LO LOESTRIN FE 1 MG-10 MCG / 10 MCG tablet, TAKE 1 TABLET BY MOUTH EVERY DAY, Disp: 84 tablet, Rfl: 3   meclizine (ANTIVERT) 25 MG tablet, Take 1 tablet (25 mg total) by mouth 2 (two) times daily., Disp: 30 tablet, Rfl: 0  Allergies  Allergen Reactions   Shellfish Allergy Itching    With swelling    Review of Systems  Constitutional:  Negative for chills, fever, malaise/fatigue and weight loss.  HENT:  Negative for congestion, ear pain, hearing loss, sore throat  and tinnitus.   Eyes:  Negative for blurred vision, pain and redness.  Respiratory:  Positive for shortness of breath. Negative for cough and hemoptysis.   Cardiovascular:  Negative for chest pain, palpitations, orthopnea, claudication and leg swelling.  Gastrointestinal:  Negative for abdominal pain, blood in stool, constipation, diarrhea, nausea and vomiting.  Genitourinary:  Negative for dysuria, flank pain, frequency, hematuria and urgency.  Musculoskeletal:  Negative for falls, joint pain and myalgias.  Skin:  Negative for itching and rash.  Neurological:  Negative for dizziness, tingling, speech change, weakness and headaches.  Endo/Heme/Allergies:  Negative for polydipsia. Does not bruise/bleed easily.  Psychiatric/Behavioral:  Negative for depression and memory loss. The patient is not nervous/anxious and does not have insomnia.      Objective:   Physical Exam  BP 120/70   Pulse 76   Ht 5' 4.5" (1.638 m)   Wt 174 lb 12.8 oz (79.3 kg)   BMI 29.54 kg/m   Wt Readings from Last 3 Encounters:  08/03/23 174 lb 12.8 oz (79.3 kg)  03/27/23 175 lb 9.6 oz (79.7 kg)  02/20/23 175 lb (79.4 kg)    General appearance: alert, no distress, WD/WN, AA female Skin: no worrisome findings, tattoo right forearm HEENT: normocephalic, conjunctiva/corneas normal, sclerae anicteric, PERRLA, EOMi, nares patent, no discharge or erythema, pharynx normal Oral cavity: MMM, tongue normal, teeth in good repair Neck: supple, no lymphadenopathy, no thyromegaly, no masses, normal ROM, no bruits Chest: non tender, normal shape and expansion Heart: RRR, normal S1, S2, no murmurs Lungs: CTA bilaterally, no wheezes, rhonchi, or rales Abdomen: +bs, soft, non tender, non distended, no masses, no hepatomegaly, no splenomegaly, no bruits Back: Back nontender, range of motion about 90% of normal, some pain getting up from the exam table.  No obvious scoliosis. musculoskeletal: Right hip external range of motion  slightly decreased, otherwise legs normal range of motion, nontender, no deformity Extremities: no edema, no cyanosis, no clubbing Pulses: 2+ symmetric, upper and lower extremities, normal cap refill Neurological: Normal lower extremity strength and sensation, otherwise alert, oriented x 3, CN2-12 intact, strength normal upper extremities and lower extremities, sensation normal throughout, DTRs 2+ throughout, no cerebellar signs, gait normal Psychiatric: normal affect, behavior normal, pleasant  Breast/gyn/rectal - deferred to gyn  EKG reviewed   Assessment and Plan :    Encounter Diagnoses  Name Primary?   Encounter for health maintenance examination in adult Yes   Screening mammogram for breast cancer    Vitamin D deficiency    SOB (shortness  of breath)    Impaired fasting blood sugar    Screening for heart disease    Chronic bilateral low back pain without sciatica       Today you had a preventative care visit or wellness visit.    Topics today may have included healthy lifestyle, diet, exercise, preventative care, vaccinations, sick and well care, proper use of emergency dept and after hours care, as well as other concerns.     Recommendations: Continue to return yearly for your annual wellness and preventative care visits.  This gives [place] a chance to discuss healthy lifestyle, exercise, vaccinations, review your chart record, and perform screenings where appropriate.  I recommend you see your eye doctor yearly for routine vision care.  I recommend you see your dentist yearly for routine dental care including hygiene visits twice yearly.  See your gynecologist yearly for routine gynecological care.    Vaccination recommendations were reviewed Immunization History  Administered Date(s) Administered   H1N1 07/31/2008   Influenza Split 05/20/2021, 04/28/2022, 06/23/2023   Influenza,inj,Quad PF,6+ Mos 06/08/2016   Influenza-Unspecified 05/17/2017, 06/18/2019, 05/24/2020    PFIZER(Purple Top)SARS-COV-2 Vaccination 02/07/2020, 03/01/2020   Tdap 12/15/2010, 07/28/2021     Screening for cancer: Breast cancer screening: You should perform a self breast exam monthly.   Continue routine mammograms  Please call to schedule your screening mammogram.   [facility]  [phone] [address], [address] [place], [place] [address]   Colon cancer screening:  Reviewed 2023 colonoscopy report  Cervical cancer screening: We reviewed recommendations for pap smear screening.  Sees gyn  Skin cancer screening: Check your skin regularly for new changes, growing lesions, or other lesions of concern Come in for evaluation if you have skin lesions of concern.  We currently don't have screenings for other cancers besides breast, cervical, colon, and lung cancers.  If you have a strong family history of cancer or have other cancer screening concerns, please let me know.    Bone health: Get at least 150 minutes of aerobic exercise weekly Get weight bearing exercise at least once weekly   Heart health: Get at least 150 minutes of aerobic exercise weekly Limit alcohol It is important to maintain a healthy blood pressure and healthy cholesterol numbers  Consider the CT coronary heart test   Separate significant issues discussed: Asthma Continue albuterol rescue inhaler as needed for cough, wheezing, or shortness of breath If you have periods of time where you need to use inhaler daily or at least 3-4 days per week, then we should consider adding a second "preventative" long acting inhaler to help symptoms.  Examples would include Advair, Symbicort, Dulera or other.   Hypertension-continue current medication Amlodipine 10mg  daily  Continue vitamin D supplement  On OCPs per gyn  Back pain-Go for baseline x-ray    Kamaryn was seen today for annual exam.  Diagnoses and all orders for this visit:  Encounter for health  maintenance examination in adult -     MM 3D SCREENING MAMMOGRAM BILATERAL BREAST -     Comprehensive metabolic panel -     CBC -     VITAMIN D 25 Hydroxy (Vit-D Deficiency, Fractures) -     Hepatitis C antibody -     EKG 12-Lead -     Hemoglobin A1c  Screening mammogram for breast cancer -     MM 3D SCREENING MAMMOGRAM BILATERAL BREAST  Vitamin D deficiency -     VITAMIN D 25 Hydroxy (  Vit-D Deficiency, Fractures)  SOB (shortness of breath) -     EKG 12-Lead  Impaired fasting blood sugar -     Hemoglobin A1c  Screening for heart disease -     EKG 12-Lead  Chronic bilateral low back pain without sciatica -     DG Lumbar Spine Complete; Future    Follow-up pending labs, yearly for physical

## 2023-08-04 LAB — CBC
Hematocrit: 38.7 % (ref 34.0–46.6)
Hemoglobin: 12.7 g/dL (ref 11.1–15.9)
MCH: 27.7 pg (ref 26.6–33.0)
MCHC: 32.8 g/dL (ref 31.5–35.7)
MCV: 85 fL (ref 79–97)
Platelets: 274 10*3/uL (ref 150–450)
RBC: 4.58 x10E6/uL (ref 3.77–5.28)
RDW: 13.1 % (ref 11.7–15.4)
WBC: 8.6 10*3/uL (ref 3.4–10.8)

## 2023-08-04 LAB — COMPREHENSIVE METABOLIC PANEL
ALT: 10 IU/L (ref 0–32)
AST: 14 IU/L (ref 0–40)
Albumin: 4.3 g/dL (ref 3.9–4.9)
Alkaline Phosphatase: 74 IU/L (ref 44–121)
BUN/Creatinine Ratio: 17 (ref 9–23)
BUN: 14 mg/dL (ref 6–24)
Bilirubin Total: 0.9 mg/dL (ref 0.0–1.2)
CO2: 22 mmol/L (ref 20–29)
Calcium: 9.1 mg/dL (ref 8.7–10.2)
Chloride: 101 mmol/L (ref 96–106)
Creatinine, Ser: 0.82 mg/dL (ref 0.57–1.00)
Globulin, Total: 2.8 g/dL (ref 1.5–4.5)
Glucose: 82 mg/dL (ref 70–99)
Potassium: 3.8 mmol/L (ref 3.5–5.2)
Sodium: 139 mmol/L (ref 134–144)
Total Protein: 7.1 g/dL (ref 6.0–8.5)
eGFR: 89 mL/min/{1.73_m2} (ref >59)

## 2023-08-04 LAB — HEMOGLOBIN A1C
Est. average glucose Bld gHb Est-mCnc: 120 mg/dL
Hgb A1c MFr Bld: 5.8 % — ABNORMAL HIGH (ref 4.8–5.6)

## 2023-08-04 LAB — VITAMIN D 25 HYDROXY (VIT D DEFICIENCY, FRACTURES): Vit D, 25-Hydroxy: 47.7 ng/mL (ref 30.0–100.0)

## 2023-08-04 LAB — HEPATITIS C ANTIBODY

## 2023-08-05 ENCOUNTER — Other Ambulatory Visit: Payer: Self-pay | Admitting: Medical

## 2023-08-05 DIAGNOSIS — I10 Essential (primary) hypertension: Secondary | ICD-10-CM

## 2023-08-05 MED ORDER — ALBUTEROL SULFATE HFA 108 (90 BASE) MCG/ACT IN AERS: 2.0000 | INHALATION_SPRAY | Freq: Four times a day (QID) | RESPIRATORY_TRACT | 1 refills | Status: DC | PRN

## 2023-08-05 MED ORDER — FLUTICASONE-SALMETEROL 115-21 MCG/ACT IN AERO: 2.0000 | INHALATION_SPRAY | Freq: Two times a day (BID) | RESPIRATORY_TRACT | 2 refills | Status: DC

## 2023-08-05 MED ORDER — AMLODIPINE BESYLATE 10 MG PO TABS: 10.0000 mg | ORAL_TABLET | Freq: Every day | ORAL | 3 refills | Status: DC

## 2023-08-05 MED ORDER — VITAMIN D 50 MCG (2000 UT) PO CAPS: 1.0000 | ORAL_CAPSULE | Freq: Every day | ORAL | 3 refills | Status: DC

## 2023-08-05 NOTE — Progress Notes (Signed)
Results sent through MyChart

## 2023-09-03 ENCOUNTER — Ambulatory Visit
Admission: RE | Admit: 2023-09-03 | Discharge: 2023-09-03 | Disposition: A | Discharge status: Home / Self Care | Payer: BC Managed Care – PPO | Source: Ambulatory Visit | Attending: Medical | Admitting: Medical

## 2023-09-03 DIAGNOSIS — Z1231 Encounter for screening mammogram for malignant neoplasm of breast: Secondary | ICD-10-CM | POA: Diagnosis not present

## 2023-09-04 ENCOUNTER — Telehealth: Payer: Self-pay | Admitting: *Deleted

## 2023-09-04 NOTE — Telephone Encounter (Signed)
 When she was here for CPE in Dec you told her to consider $95 CT Coronary scan. She would like to do this.

## 2023-09-05 ENCOUNTER — Other Ambulatory Visit: Payer: Self-pay | Admitting: Medical

## 2023-09-05 DIAGNOSIS — I10 Essential (primary) hypertension: Secondary | ICD-10-CM

## 2023-09-05 DIAGNOSIS — R928 Other abnormal and inconclusive findings on diagnostic imaging of breast: Secondary | ICD-10-CM

## 2023-09-05 DIAGNOSIS — Z136 Encounter for screening for cardiovascular disorders: Secondary | ICD-10-CM

## 2023-09-05 NOTE — Telephone Encounter (Signed)
 Patient advised.

## 2023-09-11 ENCOUNTER — Ambulatory Visit (HOSPITAL_COMMUNITY): Payer: BC Managed Care – PPO

## 2023-09-14 ENCOUNTER — Ambulatory Visit (HOSPITAL_COMMUNITY)
Admission: RE | Admit: 2023-09-14 | Discharge: 2023-09-14 | Disposition: A | Discharge status: Home / Self Care | Payer: Self-pay | Source: Ambulatory Visit | Attending: Medical | Admitting: Medical

## 2023-09-14 DIAGNOSIS — I10 Essential (primary) hypertension: Secondary | ICD-10-CM | POA: Insufficient documentation

## 2023-09-14 DIAGNOSIS — Z136 Encounter for screening for cardiovascular disorders: Secondary | ICD-10-CM | POA: Insufficient documentation

## 2023-09-16 NOTE — Progress Notes (Signed)
Results sent through MyChart

## 2023-09-17 ENCOUNTER — Telehealth (INDEPENDENT_AMBULATORY_CARE_PROVIDER_SITE_OTHER): Payer: BC Managed Care – PPO | Admitting: Medical

## 2023-09-17 ENCOUNTER — Encounter: Payer: Self-pay | Admitting: Internal Medicine

## 2023-09-17 ENCOUNTER — Other Ambulatory Visit (INDEPENDENT_AMBULATORY_CARE_PROVIDER_SITE_OTHER): Payer: BC Managed Care – PPO

## 2023-09-17 VITALS — Wt 177.0 lb

## 2023-09-17 DIAGNOSIS — R051 Acute cough: Secondary | ICD-10-CM

## 2023-09-17 DIAGNOSIS — R6889 Other general symptoms and signs: Secondary | ICD-10-CM

## 2023-09-17 DIAGNOSIS — R509 Fever, unspecified: Secondary | ICD-10-CM

## 2023-09-17 LAB — POCT INFLUENZA A/B
Influenza A, POC: POSITIVE — AB
Influenza B, POC: NEGATIVE

## 2023-09-17 LAB — POC COVID19 BINAXNOW: SARS Coronavirus 2 Ag: NEGATIVE

## 2023-09-17 MED ORDER — OSELTAMIVIR PHOSPHATE 75 MG PO CAPS: 75.0000 mg | ORAL_CAPSULE | Freq: Two times a day (BID) | ORAL | 0 refills | Status: DC

## 2023-09-17 NOTE — Progress Notes (Signed)
You are positive for flu.  Begin Tamiflu to help with symptoms and cut down on the time of time you are sick.  I sent this to the pharmacy.  Use your albuterol rescue inhaler as needed  Rest, hydrate well, you can continue over-the-counter remedy for cough and congestion  If much worse over the next few days particular with dehydration or difficulty breathing then go to the emergency department

## 2023-09-17 NOTE — Progress Notes (Signed)
Subjective:     Patient ID: Angel Morris, female   DOB: September 22, 1975, 48 y.o.   MRN: [id number]  This visit type was conducted due to national recommendations for restrictions regarding the COVID-19 Pandemic (e.g. social distancing) in an effort to limit this patient's exposure and mitigate transmission in our community.  Due to their co-morbid illnesses, this patient is at least at moderate risk for complications without adequate follow up.  This format is felt to be most appropriate for this patient at this time.    Documentation for virtual audio and video telecommunications through [place] encounter:  The patient was located at home. The provider was located in the office. The patient did consent to this visit and is aware of possible charges through their insurance for this visit.  The other persons participating in this telemedicine service were none. Time spent on call was 20 minutes and in review of previous records 20 minutes total.  This virtual service is not related to other E/M service within previous 7 days.   HPI Chief Complaint  Patient presents with   sick    Sore Throat, Achy, fever, cough,  symptoms started Saturday morning. Very fatigue. Trying to drink some fluids   She notes 2 days of not feeling well.   Has fever subjective, achy, sore throat, cough.  Feels quite fatigue.  Waking up in sweats.   Using some theraflu, and using some nyquil.  Using some robitussin as well.  She has learned that numerous people called out sick Friday with similar symptoms that she is concerned about spread of flu or COVID at her work.  She also went for CT scan on Friday for heart disease screening, the day before she started having symptoms .  no other aggravating or relieving factors. No other complaint.  Past Medical History:  Diagnosis Date   Abnormal pap    lSIL in the past   Anemia, iron deficiency    in past, not as of 05/2016   Anxiety    Asthma    childhood    Complication of anesthesia    States "when I had kidney stones removed(Open); I had a build up of fluid in my lungs. But I dont really remember much about it otherwise".   Herpes 04/05/2016   History of pyelonephritis    History of recurrent UTIs    Human papilloma virus    Hypertension    Menorrhagia    Nephrolithiasis    Overweight(278.02)    Uterine fibroid    Vaginal Pap smear, abnormal    Wears contact lenses    Current Outpatient Medications on File Prior to Visit  Medication Sig Dispense Refill   albuterol (VENTOLIN HFA) 108 (90 Base) MCG/ACT inhaler Inhale 2 puffs into the lungs every 6 (six) hours as needed for wheezing or shortness of breath. 8.5 each 1   amLODipine (NORVASC) 10 MG tablet Take 1 tablet (10 mg total) by mouth daily. 90 tablet 3   Cholecalciferol (VITAMIN D) 50 MCG (2000 UT) CAPS Take 1 capsule (2,000 Units total) by mouth daily. 90 capsule 3   fluticasone-salmeterol (ADVAIR HFA) 115-21 MCG/ACT inhaler Inhale 2 puffs into the lungs 2 (two) times daily. 1 each 2   ibuprofen (ADVIL) 800 MG tablet Take 1 tablet (800 mg total) by mouth every 8 (eight) hours as needed. 60 tablet 1   LO LOESTRIN FE 1 MG-10 MCG / 10 MCG tablet TAKE 1 TABLET BY MOUTH EVERY DAY 84 tablet 3  meclizine (ANTIVERT) 25 MG tablet Take 1 tablet (25 mg total) by mouth 2 (two) times daily. 30 tablet 0   No current facility-administered medications on file prior to visit.     Review of Systems As in subjective    Objective:   Physical Exam Due to coronavirus pandemic stay at home measures, patient visit was virtual and they were not examined in person.   Wt 177 lb (80.3 kg)   BMI 29.91 kg/m   Gen: Ill-appearing Answers questions appropriately, no labored breathing or wheezing     Assessment:     Encounter Diagnoses  Name Primary?   Flu-like symptoms Yes   Acute cough    Fever, unspecified fever cause        Plan:     Symptoms suggest influenza.  She will current her office  for flu and COVID testing today  Advise continue rest, hydration, avoid dehydration, can use Tylenol for fever not feeling well  We will likely call out Tamiflu to help with symptoms  We discussed that if symptoms worsen in the next 2 days particularly extreme fatigue or difficulty breathing go to the emergency department  Addendum: Flu+  Prescription given for Tamiflu, discussed risks/benefits of medication.    Advised supportive care including rest, hydration, OTC Tylenol or NSAID for fever, aches, and malaise.  Discussed period of contagion, self quarantine at home away from others to avoid spread of disease, discussed means of transmission, and possible complications including pneumonia.  If worse or not improving within the next 4-5 days, then call or return.    [name] was seen today for sick.  Diagnoses and all orders for this visit:  Flu-like symptoms  Acute cough  Fever, unspecified fever cause  Other orders -     oseltamivir (TAMIFLU) 75 MG capsule; Take 1 capsule (75 mg total) by mouth 2 (two) times daily.    F/u prn

## 2023-09-18 ENCOUNTER — Telehealth: Payer: Self-pay | Admitting: Medical

## 2023-09-18 NOTE — Telephone Encounter (Signed)
Pt called and she is feeling better and would like to have work note changed to go back to work on Thursday

## 2023-09-19 ENCOUNTER — Encounter: Payer: Self-pay | Admitting: Medical

## 2023-09-19 ENCOUNTER — Other Ambulatory Visit: Payer: Self-pay | Admitting: Medical

## 2023-09-19 MED ORDER — HYDROCODONE BIT-HOMATROP MBR 5-1.5 MG/5ML PO SOLN: 5.0000 mL | Freq: Three times a day (TID) | ORAL | 0 refills | Status: AC | PRN

## 2023-09-19 NOTE — Telephone Encounter (Signed)
Pt is feeling a lot better, right now cough is her biggest issue. Is there a cough med you recommend? She has been using Dayquil and robitussin.  She is going to try to go back to work tomorrow possibly depending on the cough

## 2023-09-19 NOTE — Telephone Encounter (Signed)
Pt was notified.  

## 2023-09-24 ENCOUNTER — Other Ambulatory Visit: Payer: BC Managed Care – PPO

## 2023-10-10 ENCOUNTER — Other Ambulatory Visit: Payer: Self-pay | Admitting: Medical

## 2023-10-12 ENCOUNTER — Other Ambulatory Visit: Payer: BC Managed Care – PPO

## 2023-10-16 ENCOUNTER — Ambulatory Visit (INDEPENDENT_AMBULATORY_CARE_PROVIDER_SITE_OTHER): Payer: BC Managed Care – PPO | Admitting: Medical

## 2023-10-16 VITALS — BP 118/72 | HR 60 | Temp 98.7°F | Wt 173.4 lb

## 2023-10-16 DIAGNOSIS — J4531 Mild persistent asthma with (acute) exacerbation: Secondary | ICD-10-CM

## 2023-10-16 MED ORDER — FLUTICASONE-SALMETEROL 230-21 MCG/ACT IN AERO: 2.0000 | INHALATION_SPRAY | Freq: Two times a day (BID) | RESPIRATORY_TRACT | 2 refills | Status: DC

## 2023-10-16 MED ORDER — ALBUTEROL SULFATE HFA 108 (90 BASE) MCG/ACT IN AERS: 2.0000 | INHALATION_SPRAY | Freq: Four times a day (QID) | RESPIRATORY_TRACT | 1 refills | Status: DC | PRN

## 2023-10-16 MED ORDER — BENZONATATE 100 MG PO CAPS: 100.0000 mg | ORAL_CAPSULE | Freq: Three times a day (TID) | ORAL | 0 refills | Status: DC | PRN

## 2023-10-16 NOTE — Progress Notes (Signed)
Subjective:  Angel Morris is a 48 y.o. female who presents for Chief Complaint  Patient presents with   Cough    Cough for a month, mucinex for 2 weeks which helps but not sure if she needs to keep taking it. Feels like her cough is getting worse   Here for complaint of ongoing cough.  I saw her almost a month ago for flulike symptoms.  The flu symptoms mostly resolved but she has had persistent cough since then.  Worse at night, some shortness of breath.  She is using her albuterol at least every night.  She uses her Advair only once a day in the morning.  No recent fever, chills, body aches.  She is using some Mucinex over-the-counter.  The only new symptom is limited runny nose yesterday.  Otherwise feels fine.  no other aggravating or relieving factors.    No other c/o.  The following portions of the patient's history were reviewed and updated as appropriate: allergies, current medications, past family history, past medical history, past social history, past surgical history and problem list.  ROS Otherwise as in subjective above  Objective: BP 118/72   Pulse 60   Temp 98.7 F (37.1 C)   Wt 173 lb 6.4 oz (78.7 kg)   SpO2 99%   BMI 29.30 kg/m   General appearance: alert, no distress, well developed, well nourished HEENT: normocephalic, sclerae anicteric, conjunctiva pink and moist, TMs pearly, nares patent, no discharge or erythema, pharynx normal Oral cavity: MMM, no lesions Neck: supple, no lymphadenopathy, no thyromegaly, no masses Heart: RRR, normal S1, S2, no murmurs Lungs: CTA bilaterally, no wheezes, rhonchi, or rales Pulses: 2+ radial pulses, 2+ pedal pulses, normal cap refill Ext: no edema   Assessment: Encounter Diagnosis  Name Primary?   Mild persistent asthma with exacerbation Yes     Plan: Symptoms and exam suggest asthma flareup from her recent respiratory tract infection  We will increase Advair to the next higher dose.  She needs to be using this  twice a day instead of once a day.  Rinse mouth out with water after use.  Continue albuterol as needed.  Discussed proper use of both maintenance and rescue inhaler  Can use Tessalon Perles as needed for cough.  Call and let me know within the next month how she is doing and whether she wants to continue at the higher dose or go back down to the lower dose.  Angel Morris was seen today for cough.  Diagnoses and all orders for this visit:  Mild persistent asthma with exacerbation  Other orders -     fluticasone-salmeterol (ADVAIR HFA) 230-21 MCG/ACT inhaler; Inhale 2 puffs into the lungs 2 (two) times daily. -     albuterol (VENTOLIN HFA) 108 (90 Base) MCG/ACT inhaler; Inhale 2 puffs into the lungs every 6 (six) hours as needed for wheezing or shortness of breath. -     benzonatate (TESSALON) 100 MG capsule; Take 1 capsule (100 mg total) by mouth 3 (three) times daily as needed for cough.    Follow up: prn

## 2023-10-29 ENCOUNTER — Ambulatory Visit
Admission: RE | Admit: 2023-10-29 | Discharge: 2023-10-29 | Disposition: A | Discharge status: Home / Self Care | Payer: BC Managed Care – PPO | Source: Ambulatory Visit | Attending: Medical | Admitting: Medical

## 2023-10-29 DIAGNOSIS — N6012 Diffuse cystic mastopathy of left breast: Secondary | ICD-10-CM | POA: Diagnosis not present

## 2023-10-29 DIAGNOSIS — R928 Other abnormal and inconclusive findings on diagnostic imaging of breast: Secondary | ICD-10-CM

## 2023-10-29 DIAGNOSIS — N6325 Unspecified lump in the left breast, overlapping quadrants: Secondary | ICD-10-CM | POA: Diagnosis not present

## 2023-10-30 NOTE — Progress Notes (Signed)
 Results sent through MyChart

## 2023-11-02 NOTE — Telephone Encounter (Signed)
 Pt called back and given results per vadum of her mammogram. Pt verbalized understanding.

## 2024-06-30 ENCOUNTER — Encounter: Payer: Self-pay | Admitting: Adult Health

## 2024-06-30 ENCOUNTER — Ambulatory Visit (INDEPENDENT_AMBULATORY_CARE_PROVIDER_SITE_OTHER): Admitting: Adult Health

## 2024-06-30 ENCOUNTER — Other Ambulatory Visit (HOSPITAL_COMMUNITY)
Admission: RE | Admit: 2024-06-30 | Discharge: 2024-06-30 | Disposition: A | Discharge status: Home / Self Care | Source: Ambulatory Visit | Attending: Adult Health | Admitting: Adult Health

## 2024-06-30 VITALS — BP 143/88 | HR 56 | Ht 64.0 in | Wt 165.0 lb

## 2024-06-30 DIAGNOSIS — Z124 Encounter for screening for malignant neoplasm of cervix: Secondary | ICD-10-CM | POA: Diagnosis not present

## 2024-06-30 DIAGNOSIS — Z760 Encounter for issue of repeat prescription: Secondary | ICD-10-CM

## 2024-06-30 DIAGNOSIS — N760 Acute vaginitis: Secondary | ICD-10-CM | POA: Diagnosis not present

## 2024-06-30 DIAGNOSIS — B9689 Other specified bacterial agents as the cause of diseases classified elsewhere: Secondary | ICD-10-CM

## 2024-06-30 DIAGNOSIS — I10 Essential (primary) hypertension: Secondary | ICD-10-CM

## 2024-06-30 DIAGNOSIS — N898 Other specified noninflammatory disorders of vagina: Secondary | ICD-10-CM

## 2024-06-30 DIAGNOSIS — Z3041 Encounter for surveillance of contraceptive pills: Secondary | ICD-10-CM

## 2024-06-30 LAB — POCT WET PREP (WET MOUNT)
Clue Cells Wet Prep Whiff POC: POSITIVE
Trichomonas Wet Prep HPF POC: ABSENT
WBC, Wet Prep HPF POC: POSITIVE

## 2024-06-30 MED ORDER — PROMETHAZINE HCL 12.5 MG PO TABS: 12.5000 mg | ORAL_TABLET | Freq: Four times a day (QID) | ORAL | 1 refills | Status: AC | PRN

## 2024-06-30 MED ORDER — LO LOESTRIN FE 1 MG-10 MCG / 10 MCG PO TABS: 1.0000 | ORAL_TABLET | Freq: Every day | ORAL | 3 refills | Status: AC

## 2024-06-30 MED ORDER — METRONIDAZOLE 500 MG PO TABS: 500.0000 mg | ORAL_TABLET | Freq: Two times a day (BID) | ORAL | 0 refills | Status: DC

## 2024-06-30 NOTE — Progress Notes (Signed)
  Subjective:     Patient ID: Angel Morris, female   DOB: 04/04/1976, 48 y.o.   MRN: [id number]  HPI Angel Morris is a 48 year old black female,divorced, [id number] in complaining of vaginal odor, usually with spotting and she needs a pap.   PCP is [facility] PA.  Review of Systems +vaginal odor, usually with spotting No sex in over a year Has been gassy today Reviewed past medical,surgical, social and family history. Reviewed medications and allergies.     Objective:   Physical Exam BP (!) 143/88 (BP Location: Right Arm, Patient Position: Sitting, Cuff Size: Normal)   Pulse (!) 56   Ht 5' 4 (1.626 m)   Wt 165 lb (74.8 kg)   BMI 28.32 kg/m     Skin warm and dry.Pelvic: external genitalia is normal in appearance no lesions, vagina: white discharge with odor,urethra has no lesions or masses noted, cervix:smooth and bulbous, uterus: normal size, shape and contour, non tender, no masses felt, adnexa: no masses or tenderness noted. Bladder is non tender and no masses felt. Wet prep: + for clue cells and +WBCs. Fall risk is moderate  [facility] - 06/30/24 1602       Pregnancy Intention Screening   Does the patient want to become pregnant in the next year? No    Does the patient's partner want to become pregnant in the next year? No    Would the patient like to discuss contraceptive options today? No      Contraception Wrap Up   Current Method Abstinence;Oral Contraceptive    End Method Abstinence;Oral Contraceptive    Contraception Counseling Provided Yes         Examination chaperoned by Clarita Salt LPN  Assessment:     1. Routine Papanicolaou smear Pap sent Pap in 3 years if negative  - Cytology - PAP( [facility])  2. Vaginal odor (Primary) +vaginal odor +clue cells  Will rx flagyl  500 mg 1 bid x 7 days and gave phenergan to take before it - POCT Wet Prep [employer])  3. BV (bacterial vaginosis) Rx flagyl   - POCT Wet Prep [facility])  4. Essential hypertension Taking  Norvasc  10 mg 1 daily, and follow up with PCP  5. Encounter for surveillance of contraceptive pills Refill lo loestrin  Meds ordered this encounter  Medications   metroNIDAZOLE  (FLAGYL ) 500 MG tablet    Sig: Take 1 tablet (500 mg total) by mouth 2 (two) times daily.    Dispense:  14 tablet    Refill:  0    Supervising Provider:   JAYNE MINDER H [[id number]]   promethazine (PHENERGAN) 12.5 MG tablet    Sig: Take 1 tablet (12.5 mg total) by mouth every 6 (six) hours as needed.    Dispense:  30 tablet    Refill:  1    Supervising Provider:   JAYNE MINDER H [[id number]]   Norethindrone -Ethinyl Estradiol-Fe Biphas (LO LOESTRIN FE ) 1 MG-10 MCG / 10 MCG tablet    Sig: Take 1 tablet by mouth daily.    Dispense:  84 tablet    Refill:  3    Supervising Provider:   JAYNE MINDER DEL [[id number]]       Plan:     Return for Physical in 1 year

## 2024-07-03 LAB — CYTOLOGY - PAP
Adequacy: ABSENT
Comment: NEGATIVE
Diagnosis: NEGATIVE
High risk HPV: NEGATIVE

## 2024-07-04 ENCOUNTER — Ambulatory Visit: Payer: Self-pay | Admitting: Adult Health

## 2024-08-06 ENCOUNTER — Encounter: Payer: Self-pay | Admitting: Medical

## 2024-08-06 ENCOUNTER — Ambulatory Visit: Payer: BC Managed Care – PPO | Admitting: Medical

## 2024-08-06 VITALS — BP 122/70 | HR 51 | Ht 64.0 in | Wt 168.4 lb

## 2024-08-06 DIAGNOSIS — Z13220 Encounter for screening for lipoid disorders: Secondary | ICD-10-CM | POA: Diagnosis not present

## 2024-08-06 DIAGNOSIS — R7301 Impaired fasting glucose: Secondary | ICD-10-CM | POA: Diagnosis not present

## 2024-08-06 DIAGNOSIS — Z87442 Personal history of urinary calculi: Secondary | ICD-10-CM

## 2024-08-06 DIAGNOSIS — M249 Joint derangement, unspecified: Secondary | ICD-10-CM

## 2024-08-06 DIAGNOSIS — R110 Nausea: Secondary | ICD-10-CM | POA: Diagnosis not present

## 2024-08-06 DIAGNOSIS — Z23 Encounter for immunization: Secondary | ICD-10-CM

## 2024-08-06 DIAGNOSIS — Z0000 Encounter for general adult medical examination without abnormal findings: Secondary | ICD-10-CM | POA: Diagnosis not present

## 2024-08-06 DIAGNOSIS — M25362 Other instability, left knee: Secondary | ICD-10-CM | POA: Diagnosis not present

## 2024-08-06 DIAGNOSIS — I10 Essential (primary) hypertension: Secondary | ICD-10-CM | POA: Diagnosis not present

## 2024-08-06 DIAGNOSIS — M25361 Other instability, right knee: Secondary | ICD-10-CM | POA: Insufficient documentation

## 2024-08-06 DIAGNOSIS — J4520 Mild intermittent asthma, uncomplicated: Secondary | ICD-10-CM | POA: Diagnosis not present

## 2024-08-06 DIAGNOSIS — L659 Nonscarring hair loss, unspecified: Secondary | ICD-10-CM | POA: Diagnosis not present

## 2024-08-06 NOTE — Progress Notes (Signed)
 Subjective:   HPI  Angel Morris is a 48 y.o. female who presents for a complete physical.  Medical team: [facility], Delon Lewis NP [employer] for dentist Eye doctor Dr. Reyes Phi, [facility] Dr. Glendia Holt, GI Shadee Rathod, Alm RAMAN, PA-C here for primary care   Concerns: Angel Morris is a 48 year old female who presents for an annual physical exam.  She is concerned about her blood sugar levels, as she was previously told she was borderline diabetic. She is interested in monitoring this to ensure it has not worsened.  She reports a concern about her knee, stating that she felt her kneecap dislocate, describing it as 'popping out' and feeling sore for a couple of days. She has a history of shoulder dislocations requiring surgical intervention. She has been advised in the past to strengthen her calf and thigh muscles to support her knees.  She describes experiencing nausea that began on Monday afternoon and persisted into Tuesday, feeling significantly worse on Tuesday morning. She denies any recent sexual activity, ruling out pregnancy as a cause. She attempted to alleviate the nausea with Tums, which provided no relief. She also experienced heartburn like real bad yesterday and wonders if her symptoms could be related to her gallbladder, as she has a history of gallstones. She notes that she has not consumed any particularly greasy, spicy, or acidic foods recently, although she did eat ribs on Sunday.  She has a history of vertigo diagnosed a few years ago, which she initially considered as a possible cause of her nausea, but notes that her current symptoms differ from her previous vertigo episodes.  She lives with her three younger children and has four grandchildren. She works at [employer] and has been trying to improve her physical activity by walking daily and using dumbbells. She denies smoking and consumes alcohol occasionally, about once  a month. She reports regular bowel movements and no recent spicy, acidic, or greasy food intake.  Need physical form completed for work  HTN - compliant with medication  Asthma - usually fine but having to use inhaler some albuterol  the past week   Reviewed their medical, surgical, family, social, medication, and allergy history and updated chart as appropriate.  Past Medical History:  Diagnosis Date   Abnormal pap    lSIL in the past   Anemia, iron deficiency    in past, not as of 05/2016   Anxiety    Asthma    childhood   Complication of anesthesia    States when I had kidney stones removed(Open); I had a build up of fluid in my lungs. But I dont really remember much about it otherwise.   Herpes 04/05/2016   History of pyelonephritis    History of recurrent UTIs    Human papilloma virus    Hypertension    Menorrhagia    Nephrolithiasis    Overweight(278.02)    Uterine fibroid    Vaginal Pap smear, abnormal    Wears contact lenses     Past Surgical History:  Procedure Laterality Date   CERVICAL CONIZATION W/BX N/A 10/08/2013   Procedure: CONIZATION CERVIX WITH BIOPSY;  Surgeon: Vonn VEAR Inch, MD;  Location: [facility];  Service: Gynecology;  Laterality: N/A;   DILATION AND CURETTAGE OF UTERUS     DILITATION & CURRETTAGE/HYSTROSCOPY WITH NOVASURE ABLATION N/A 07/02/2015   Procedure: DILATATION & CURETTAGE/HYSTEROSCOPY WITH NOVASURE ABLATION;  Surgeon: Vonn VEAR Inch, MD;  Location: [facility];  Service:  Gynecology;  Laterality: N/A;  Uterine Cavity Length 5.5 Uterine Cavity Width 3.5 Power 106 Time 1 minute 4 seconds   HOLMIUM LASER APPLICATION N/A 10/08/2013   Procedure: HOLMIUM LASER APPLICATION;  Surgeon: Vonn VEAR Inch, MD;  Location: [facility];  Service: Gynecology;  Laterality: N/A;   kidney stones  june 2000   open removal   rotary cuff - left shoulder  2005     Family History  Problem Relation Age of Onset   Fibromyalgia Mother    Arthritis Mother        RA    Hypertension Mother    Diabetes Mother    Diabetes Father        diabetes, amputation   Heart disease Father 86       died of MI 2017/01/20   Kidney disease Father        dialysis   Diabetes Brother    Hypertension Brother    Stroke Maternal Grandmother    Aneurysm Maternal Grandfather    Stroke Maternal Grandfather    Diabetes Son    Cancer Neg Hx    Colon polyps Neg Hx    Rectal cancer Neg Hx    Stomach cancer Neg Hx      Current Outpatient Medications:    albuterol  (VENTOLIN  HFA) 108 (90 Base) MCG/ACT inhaler, Inhale 2 puffs into the lungs every 6 (six) hours as needed for wheezing or shortness of breath., Disp: 18 each, Rfl: 1   amLODipine  (NORVASC ) 10 MG tablet, Take 1 tablet (10 mg total) by mouth daily., Disp: 90 tablet, Rfl: 3   fluticasone -salmeterol (ADVAIR HFA) 230-21 MCG/ACT inhaler, Inhale 2 puffs into the lungs 2 (two) times daily., Disp: 1 each, Rfl: 2   ibuprofen  (ADVIL ) 800 MG tablet, Take 1 tablet (800 mg total) by mouth every 8 (eight) hours as needed., Disp: 60 tablet, Rfl: 1   Norethindrone -Ethinyl Estradiol-Fe Biphas (LO LOESTRIN FE ) 1 MG-10 MCG / 10 MCG tablet, Take 1 tablet by mouth daily., Disp: 84 tablet, Rfl: 3   promethazine  (PHENERGAN ) 12.5 MG tablet, Take 1 tablet (12.5 mg total) by mouth every 6 (six) hours as needed., Disp: 30 tablet, Rfl: 1   OIL OF [place]O PO, Take by mouth., Disp: , Rfl:    UNABLE TO FIND, Cortisol drops-daily, Disp: , Rfl:   Allergies  Allergen Reactions   Shellfish Allergy Itching    With swelling    Review of Systems  Constitutional:  Negative for chills, fever, malaise/fatigue and weight loss.  HENT:  Negative for congestion, ear pain, hearing loss, sore throat and tinnitus.   Eyes:  Negative for blurred vision, pain and redness.  Respiratory:  Negative for cough, hemoptysis and shortness of breath.   Cardiovascular:  Negative for chest pain, palpitations, orthopnea, claudication and leg swelling.  Gastrointestinal:   Positive for abdominal pain and nausea. Negative for blood in stool, constipation, diarrhea and vomiting.  Genitourinary:  Negative for dysuria, flank pain, frequency, hematuria and urgency.  Musculoskeletal:  Positive for joint pain. Negative for falls and myalgias.  Skin:  Negative for itching and rash.  Neurological:  Negative for dizziness, tingling, speech change, weakness and headaches.  Endo/Heme/Allergies:  Negative for polydipsia. Does not bruise/bleed easily.  Psychiatric/Behavioral:  Negative for depression and memory loss. The patient is not nervous/anxious and does not have insomnia.      Objective:   Physical Exam  BP 122/70   Pulse (!) 51   Ht 5' 4 (1.626 m)   Wt  168 lb 6.4 oz (76.4 kg)   SpO2 100%   BMI 28.91 kg/m   Wt Readings from Last 3 Encounters:  08/06/24 168 lb 6.4 oz (76.4 kg)  06/30/24 165 lb (74.8 kg)  10/16/23 173 lb 6.4 oz (78.7 kg)    General appearance: alert, no distress, WD/WN, AA female Skin: hair on head shaven completely, no worrisome findings, tattoo right forearm HEENT: normocephalic, conjunctiva/corneas normal, sclerae anicteric, PERRLA, EOMi, nares patent, no discharge or erythema, pharynx normal Oral cavity: MMM, tongue normal, teeth in good repair Neck: supple, no lymphadenopathy, no thyromegaly, no masses, normal ROM, no bruits Chest: non tender, normal shape and expansion Heart: RRR, normal S1, S2, no murmurs Lungs: CTA bilaterally, no wheezes, rhonchi, or rales Abdomen: +bs, soft, non tender, non distended, no masses, no hepatomegaly, no splenomegaly, no bruits Back: Back nontender,no obvious abnormality, No obvious scoliosis.  musculoskeletal: Laxity of patella slightly bilaterally but otherwise legs and knees nontender, no swelling, no other deformity, arms unremarkable Extremities: no edema, no cyanosis, no clubbing Pulses: 2+ symmetric, upper and lower extremities, normal cap refill Neurological: Normal lower extremity strength  and sensation, otherwise alert, oriented x 3, CN2-12 intact, strength normal upper extremities and lower extremities, sensation normal throughout, DTRs 2+ throughout, no cerebellar signs, gait normal Psychiatric: normal affect, behavior normal, pleasant  Breast/gyn/rectal - deferred to gyn     Assessment and Plan :    Encounter Diagnoses  Name Primary?   Routine general medical examination at a health care facility Yes   Needs flu shot    Essential hypertension    History of renal stone    Mild intermittent asthma without complication    Nausea    Patellar instability of both knees    Impaired fasting blood sugar    Screening for lipid disorders    Hypermobile joints    Hair loss       Today you had a preventative care visit or wellness visit.    Topics today may have included healthy lifestyle, diet, exercise, preventative care, vaccinations, sick and well care, proper use of emergency dept and after hours care, as well as other concerns.     Recommendations: Continue to return yearly for your annual wellness and preventative care visits.  This gives us  a chance to discuss healthy lifestyle, exercise, vaccinations, review your chart record, and perform screenings where appropriate.  I recommend you see your eye doctor yearly for routine vision care.  I recommend you see your dentist yearly for routine dental care including hygiene visits twice yearly.  See your gynecologist yearly for routine gynecological care.    Vaccination recommendations were reviewed Immunization History  Administered Date(s) Administered   H1N1 07/31/2008   Influenza Split 05/20/2021, 04/28/2022, 06/23/2023   Influenza, Seasonal, Injecte, Preservative Fre 08/06/2024   Influenza,inj,Quad PF,6+ Mos 06/08/2016   Influenza-Unspecified 05/17/2017, 06/18/2019, 05/24/2020   PFIZER(Purple Top)SARS-COV-2 Vaccination 02/07/2020, 03/01/2020   Tdap 12/15/2010, 07/28/2021   Counseled on the influenza  virus vaccine.   Influenza vaccine given after consent obtained.    Screening for cancer: Breast cancer screening: mammogram up to date, managed by gynecology  Colon cancer screening:  Reviewed 2023 colonoscopy report.  Repeat in 7 years  Cervical cancer screening: We reviewed recommendations for pap smear screening.  Sees gyn  Skin cancer screening: Check your skin regularly for new changes, growing lesions, or other lesions of concern Come in for evaluation if you have skin lesions of concern.  We currently don't have screenings for  other cancers besides breast, cervical, colon, and lung cancers.  If you have a strong family history of cancer or have other cancer screening concerns, please let me know.    Bone health: Get at least 150 minutes of aerobic exercise weekly Get weight bearing exercise at least once weekly   Heart health: Get at least 150 minutes of aerobic exercise weekly Limit alcohol It is important to maintain a healthy blood pressure and healthy cholesterol numbers  CT coronary calcium score 09/14/2023 score of 0   Separate significant issues discussed: Asthma Continue albuterol  rescue inhaler as needed for cough, wheezing, or shortness of breath Continue Advair for maintenance during seasons of worse asthma  Hypertension-continue current medication Amlodipine  10mg  daily  Continue vitamin D  supplement  On OCPs per gyn  Patellar instability- referral back to PT.  Consider follow up with sports medicine  Hair thinning, hair loss-not interested in treatment at this time.  It sounds like there is a strong genetic component as well given other family members including her children with significant hair loss  Hypermobile joints, hair thinning-additional autoimmune lab markers today ordered  Nausea-updated labs today, discussed possible causes   Anadia was seen today for annual exam.  Diagnoses and all orders for this visit:  Routine general  medical examination at a health care facility -     CBC -     Comprehensive metabolic panel with GFR -     Lipid panel -     Hemoglobin A1c -     TSH -     Urinalysis, Routine w reflex microscopic -     Lipase -     ANA -     Sedimentation rate -     Ambulatory referral to Rheumatology  Needs flu shot -     Flu vaccine trivalent PF, 6mos and older(Flulaval,Afluria,Fluarix,Fluzone)  Essential hypertension -     Urinalysis, Routine w reflex microscopic  History of renal stone  Mild intermittent asthma without complication  Nausea -     Lipase  Patellar instability of both knees -     ANA -     Sedimentation rate -     Ambulatory referral to Rheumatology -     Ambulatory referral to Physical Therapy  Impaired fasting blood sugar -     Hemoglobin A1c  Screening for lipid disorders -     Lipid panel  Hypermobile joints -     ANA -     Sedimentation rate -     Ambulatory referral to Rheumatology  Hair loss    Follow-up pending labs, yearly for physical

## 2024-08-07 ENCOUNTER — Other Ambulatory Visit: Payer: Self-pay | Admitting: Medical

## 2024-08-07 ENCOUNTER — Ambulatory Visit: Payer: Self-pay | Admitting: Medical

## 2024-08-07 ENCOUNTER — Ambulatory Visit: Payer: Self-pay

## 2024-08-07 DIAGNOSIS — I10 Essential (primary) hypertension: Secondary | ICD-10-CM

## 2024-08-07 LAB — CBC
Hematocrit: 39.8 % (ref 34.0–46.6)
Hemoglobin: 13.1 g/dL (ref 11.1–15.9)
MCH: 28.4 pg (ref 26.6–33.0)
MCHC: 32.9 g/dL (ref 31.5–35.7)
MCV: 86 fL (ref 79–97)
Platelets: 275 x10E3/uL (ref 150–450)
RBC: 4.61 x10E6/uL (ref 3.77–5.28)
RDW: 13.3 % (ref 11.7–15.4)
WBC: 8.3 x10E3/uL (ref 3.4–10.8)

## 2024-08-07 LAB — SEDIMENTATION RATE: Sed Rate: 11 mm/h (ref 0–32)

## 2024-08-07 LAB — URINALYSIS, ROUTINE W REFLEX MICROSCOPIC
Bilirubin, UA: NEGATIVE
Glucose, UA: NEGATIVE
Leukocytes,UA: NEGATIVE
Nitrite, UA: NEGATIVE
RBC, UA: NEGATIVE
Specific Gravity, UA: 1.023 (ref 1.005–1.030)
Urobilinogen, Ur: 1 mg/dL (ref 0.2–1.0)
pH, UA: 6.5 (ref 5.0–7.5)

## 2024-08-07 LAB — COMPREHENSIVE METABOLIC PANEL WITH GFR
ALT: 11 IU/L (ref 0–32)
AST: 15 IU/L (ref 0–40)
Albumin: 4.1 g/dL (ref 3.9–4.9)
Alkaline Phosphatase: 72 IU/L (ref 41–116)
BUN/Creatinine Ratio: 16 (ref 9–23)
BUN: 15 mg/dL (ref 6–24)
Bilirubin Total: 0.9 mg/dL (ref 0.0–1.2)
CO2: 21 mmol/L (ref 20–29)
Calcium: 9.5 mg/dL (ref 8.7–10.2)
Chloride: 102 mmol/L (ref 96–106)
Creatinine, Ser: 0.95 mg/dL (ref 0.57–1.00)
Globulin, Total: 2.8 g/dL (ref 1.5–4.5)
Glucose: 92 mg/dL (ref 70–99)
Potassium: 3.7 mmol/L (ref 3.5–5.2)
Sodium: 137 mmol/L (ref 134–144)
Total Protein: 6.9 g/dL (ref 6.0–8.5)
eGFR: 74 mL/min/1.73 (ref >59)

## 2024-08-07 LAB — LIPID PANEL
Chol/HDL Ratio: 2.2 ratio (ref 0.0–4.4)
Cholesterol, Total: 143 mg/dL (ref 100–199)
HDL: 65 mg/dL (ref >39)
LDL Chol Calc (NIH): 67 mg/dL (ref 0–99)
Triglycerides: 48 mg/dL (ref 0–149)
VLDL Cholesterol Cal: 11 mg/dL (ref 5–40)

## 2024-08-07 LAB — HEMOGLOBIN A1C
Est. average glucose Bld gHb Est-mCnc: 108 mg/dL
Hgb A1c MFr Bld: 5.4 % (ref 4.8–5.6)

## 2024-08-07 LAB — ANA: Anti Nuclear Antibody (ANA): NEGATIVE

## 2024-08-07 LAB — TSH: TSH: 0.688 u[IU]/mL (ref 0.450–4.500)

## 2024-08-07 LAB — LIPASE: Lipase: 21 U/L (ref 14–72)

## 2024-08-07 MED ORDER — ALBUTEROL SULFATE HFA 108 (90 BASE) MCG/ACT IN AERS: 2.0000 | INHALATION_SPRAY | Freq: Four times a day (QID) | RESPIRATORY_TRACT | 1 refills | Status: AC | PRN

## 2024-08-07 MED ORDER — FLUTICASONE-SALMETEROL 230-21 MCG/ACT IN AERO: 2.0000 | INHALATION_SPRAY | Freq: Two times a day (BID) | RESPIRATORY_TRACT | 2 refills | Status: AC

## 2024-08-07 MED ORDER — ONDANSETRON HCL 4 MG PO TABS: 4.0000 mg | ORAL_TABLET | Freq: Every day | ORAL | 0 refills | Status: AC | PRN

## 2024-08-07 NOTE — Telephone Encounter (Signed)
 FYI Only or Action Required?: Action required by provider: lab or test result follow-up needed.   Patient returning call to discuss labs and inquire about Rheumatology Consult.  Please call patient tomorrow, 12/12, if possible at 10 AM or 1:30 PM when patient is on break at work.  Otherwise she will not have her phone with her any other time. No symptoms reported.   Patient was last seen in primary care on 08/06/2024 by Bulah Alm RAMAN, PA-C.  Called Nurse Triage reporting Labs Only (Returning call to go over labs.  Also inquires about Rheumatology Referral. ).  Triage Disposition: Call PCP When Office is Open  Patient/caregiver understands and will follow disposition?: Yes     Copied from CRM #[id number]. Topic: Clinical - Lab/Test Results >> Aug 07, 2024  4:21 PM Myrick T wrote: Reason for CRM: patient recvd her lab results and would like nurse to explain her lab results to her.   Reason for Disposition  Caller requesting routine or non-urgent lab result  Answer Assessment - Initial Assessment Questions 1. REASON FOR CALL: What is the main reason for your call? or How can I best help you?     Returning phone call from [place] to go over lab results.  Also asks about Rheumatology Consult.   2. SYMPTOMS : Do you have any symptoms?      None reported, returning phone call.   3. OTHER QUESTIONS: Do you have any other questions?     Received call from Rheumatology possibly, wondering about this.   Triage RN reviews notes in chart under labs.  Provider has noted Rheumatology order was in error, made patient aware.  Advised patient will send note over to the office for them to call her back.  Due to time of day currently, will likely receive response tomorrow.     Patient states preference to receive call tomorrow at 10 AM or 1:30 PM while on break at work.  Will not have phone otherwise.  Answer Assessment - Initial Assessment Questions 1. REASON FOR CALL: What is the main  reason for your call? or How can I best help you?     Returning phone call from [place] to go over lab results.  Also asks about Rheumatology Consult.   2. SYMPTOMS : Do you have any symptoms?      None reported, returning phone call.   3. OTHER QUESTIONS: Do you have any other questions?     Received call from Rheumatology possibly, wondering about this.   Triage RN reviews notes in chart under labs.  Provider has noted Rheumatology order was in error, made patient aware.  Advised patient will send note over to the office for them to call her back.  Due to time of day currently, will likely receive response tomorrow.     Patient states preference to receive call tomorrow at 10 AM or 1:30 PM while on break at work.  Will not have phone otherwise.  Protocols used: Information Only Call - No Triage-A-[facility], PCP Call - No Triage-A-[facility]

## 2024-08-07 NOTE — Progress Notes (Signed)
 Results to MyChart

## 2024-08-07 NOTE — Progress Notes (Signed)
 Please cancel the referral to rheumatology.  I do not know that got in there  Results through MyChart

## 2024-08-08 NOTE — Telephone Encounter (Signed)
 Pt was notified.  Rheumatology was paid in error

## 2024-09-30 ENCOUNTER — Encounter: Payer: Self-pay | Admitting: Adult Health

## 2024-09-30 ENCOUNTER — Other Ambulatory Visit (HOSPITAL_COMMUNITY)
Admission: RE | Admit: 2024-09-30 | Discharge: 2024-09-30 | Disposition: A | Source: Ambulatory Visit | Attending: Obstetrics & Gynecology | Admitting: Obstetrics & Gynecology

## 2024-09-30 ENCOUNTER — Ambulatory Visit

## 2024-09-30 DIAGNOSIS — N898 Other specified noninflammatory disorders of vagina: Secondary | ICD-10-CM | POA: Diagnosis not present

## 2024-10-02 ENCOUNTER — Ambulatory Visit: Payer: Self-pay | Admitting: Adult Health

## 2024-10-02 LAB — CERVICOVAGINAL ANCILLARY ONLY
Bacterial Vaginitis (gardnerella): POSITIVE — AB
Candida Glabrata: NEGATIVE
Candida Vaginitis: NEGATIVE
Comment: NEGATIVE
Comment: NEGATIVE
Comment: NEGATIVE

## 2024-10-02 MED ORDER — METRONIDAZOLE 500 MG PO TABS: 500.0000 mg | ORAL_TABLET | Freq: Two times a day (BID) | ORAL | 0 refills | Status: AC

## 2024-10-03 ENCOUNTER — Other Ambulatory Visit: Payer: Self-pay | Admitting: Medical

## 2024-10-03 DIAGNOSIS — Z1231 Encounter for screening mammogram for malignant neoplasm of breast: Secondary | ICD-10-CM

## 2024-10-09 ENCOUNTER — Ambulatory Visit

## 2025-08-07 ENCOUNTER — Encounter: Admitting: Medical
